# Patient Record
Sex: Female | Born: 1955
Health system: Southern US, Community
[De-identification: ages and names within clinical notes are randomized; demographics above are authoritative.]

## PROBLEM LIST (undated history)

## (undated) DIAGNOSIS — I509 Heart failure, unspecified: Secondary | ICD-10-CM

## (undated) DIAGNOSIS — N189 Chronic kidney disease, unspecified: Secondary | ICD-10-CM

## (undated) DIAGNOSIS — I10 Essential (primary) hypertension: Secondary | ICD-10-CM

## (undated) DIAGNOSIS — I428 Other cardiomyopathies: Secondary | ICD-10-CM

## (undated) DIAGNOSIS — D649 Anemia, unspecified: Secondary | ICD-10-CM

## (undated) DIAGNOSIS — M9080 Osteopathy in diseases classified elsewhere, unspecified site: Secondary | ICD-10-CM

## (undated) DIAGNOSIS — Z9289 Personal history of other medical treatment: Secondary | ICD-10-CM

## (undated) DIAGNOSIS — L299 Pruritus, unspecified: Secondary | ICD-10-CM

## (undated) DIAGNOSIS — D631 Anemia in chronic kidney disease: Secondary | ICD-10-CM

## (undated) DIAGNOSIS — E669 Obesity, unspecified: Secondary | ICD-10-CM

## (undated) DIAGNOSIS — M898X9 Other specified disorders of bone, unspecified site: Secondary | ICD-10-CM

## (undated) DIAGNOSIS — E785 Hyperlipidemia, unspecified: Secondary | ICD-10-CM

## (undated) DIAGNOSIS — E889 Metabolic disorder, unspecified: Secondary | ICD-10-CM

## (undated) DIAGNOSIS — E079 Disorder of thyroid, unspecified: Secondary | ICD-10-CM

## (undated) DIAGNOSIS — R609 Edema, unspecified: Secondary | ICD-10-CM

## (undated) DIAGNOSIS — J45909 Unspecified asthma, uncomplicated: Secondary | ICD-10-CM

## (undated) DIAGNOSIS — C541 Malignant neoplasm of endometrium: Secondary | ICD-10-CM

## (undated) DIAGNOSIS — E039 Hypothyroidism, unspecified: Secondary | ICD-10-CM

## (undated) DIAGNOSIS — K259 Gastric ulcer, unspecified as acute or chronic, without hemorrhage or perforation: Secondary | ICD-10-CM

## (undated) DIAGNOSIS — R110 Nausea: Secondary | ICD-10-CM

## (undated) DIAGNOSIS — Z8709 Personal history of other diseases of the respiratory system: Secondary | ICD-10-CM

## (undated) DIAGNOSIS — R600 Localized edema: Secondary | ICD-10-CM

## (undated) HISTORY — DX: Other specified disorders of bone, unspecified site: M89.8X9

## (undated) HISTORY — DX: Disorder of thyroid, unspecified: E07.9

## (undated) HISTORY — DX: Heart failure, unspecified: I50.9

## (undated) HISTORY — DX: Anemia, unspecified: D64.9

## (undated) HISTORY — DX: Metabolic disorder, unspecified: E88.9

## (undated) HISTORY — DX: Anemia in chronic kidney disease: D63.1

## (undated) HISTORY — DX: Chronic kidney disease, unspecified: N18.9

## (undated) HISTORY — PX: OTHER SURGICAL HISTORY: SHX169

## (undated) HISTORY — PX: COLONOSCOPY: SHX174

## (undated) HISTORY — DX: Hypothyroidism, unspecified: E03.9

## (undated) HISTORY — DX: Essential (primary) hypertension: I10

## (undated) HISTORY — DX: Gastric ulcer, unspecified as acute or chronic, without hemorrhage or perforation: K25.9

## (undated) HISTORY — DX: Malignant neoplasm of endometrium: C54.1

## (undated) HISTORY — DX: Obesity, unspecified: E66.9

## (undated) HISTORY — DX: Osteopathy in diseases classified elsewhere, unspecified site: M90.80

## (undated) HISTORY — PX: DILATION AND CURETTAGE, DIAGNOSTIC / THERAPEUTIC: SUR384

## (undated) HISTORY — PX: TUBAL LIGATION: SHX77

---

## 2008-09-26 ENCOUNTER — Emergency Department (HOSPITAL_COMMUNITY): Admission: EM | Admit: 2008-09-26 | Discharge: 2008-09-26 | Payer: Self-pay | Admitting: Emergency Medicine

## 2012-05-16 ENCOUNTER — Encounter: Payer: Self-pay | Admitting: Internal Medicine

## 2012-05-16 DIAGNOSIS — I509 Heart failure, unspecified: Secondary | ICD-10-CM

## 2012-05-16 DIAGNOSIS — R809 Proteinuria, unspecified: Secondary | ICD-10-CM

## 2012-05-16 DIAGNOSIS — D509 Iron deficiency anemia, unspecified: Secondary | ICD-10-CM

## 2012-05-16 DIAGNOSIS — N184 Chronic kidney disease, stage 4 (severe): Secondary | ICD-10-CM

## 2012-05-21 DIAGNOSIS — D649 Anemia, unspecified: Secondary | ICD-10-CM

## 2012-05-21 DIAGNOSIS — N189 Chronic kidney disease, unspecified: Secondary | ICD-10-CM

## 2012-06-04 DIAGNOSIS — D509 Iron deficiency anemia, unspecified: Secondary | ICD-10-CM

## 2012-06-11 DIAGNOSIS — N189 Chronic kidney disease, unspecified: Secondary | ICD-10-CM

## 2012-06-11 DIAGNOSIS — D649 Anemia, unspecified: Secondary | ICD-10-CM

## 2012-07-02 ENCOUNTER — Encounter: Payer: Self-pay | Admitting: Internal Medicine

## 2012-07-08 ENCOUNTER — Encounter: Payer: Self-pay | Admitting: Vascular Surgery

## 2012-07-08 ENCOUNTER — Other Ambulatory Visit: Payer: Self-pay | Admitting: *Deleted

## 2012-07-08 DIAGNOSIS — Z01810 Encounter for preprocedural cardiovascular examination: Secondary | ICD-10-CM

## 2012-07-08 DIAGNOSIS — N184 Chronic kidney disease, stage 4 (severe): Secondary | ICD-10-CM

## 2012-07-09 ENCOUNTER — Other Ambulatory Visit: Payer: Self-pay

## 2012-07-09 ENCOUNTER — Encounter: Payer: Self-pay | Admitting: Vascular Surgery

## 2012-07-09 ENCOUNTER — Encounter (INDEPENDENT_AMBULATORY_CARE_PROVIDER_SITE_OTHER): Payer: BC Managed Care – PPO | Admitting: *Deleted

## 2012-07-09 ENCOUNTER — Ambulatory Visit (INDEPENDENT_AMBULATORY_CARE_PROVIDER_SITE_OTHER): Payer: BC Managed Care – PPO | Admitting: Vascular Surgery

## 2012-07-09 VITALS — BP 166/79 | HR 74 | Ht 63.0 in | Wt 175.0 lb

## 2012-07-09 DIAGNOSIS — N184 Chronic kidney disease, stage 4 (severe): Secondary | ICD-10-CM

## 2012-07-09 DIAGNOSIS — Z01810 Encounter for preprocedural cardiovascular examination: Secondary | ICD-10-CM

## 2012-07-09 DIAGNOSIS — N186 End stage renal disease: Secondary | ICD-10-CM

## 2012-07-09 NOTE — Assessment & Plan Note (Signed)
This patient will likely require an AV graft. The forearm and upper arm cephalic vein bilaterally are very small. There is a small chance that Shannon [name] would be a candidate for a left basilic vein transposition. I have explained that I will interrogate her veins with the duplex at the time of surgery. If at all possible we will place an AV fistula. I have explained the indications for placement of an AV fistula or AV graft. I've explained that if at all possible we will place an AV fistula.  I have reviewed the risks of placement of an AV fistula including but not limited to: failure of the fistula to mature, need for subsequent interventions, and thrombosis. In addition I have reviewed the potential complications of placement of an AV graft. These risks include, but are not limited to, graft thrombosis, graft infection, wound healing problems, bleeding, arm swelling, and steal syndrome. All the patient's questions were answered and they are agreeable to proceed with surgery. Her surgery is scheduled for 07/22/2012.

## 2012-07-09 NOTE — Progress Notes (Signed)
[facility] [place]  Patient name: Shannon [name] MRN: [id number] DOB: May 14, 1956 Sex: female  REASON FOR CONSULT: evaluate for hemodialysis access. Referred by Dr. Lowanda Foster  HPI: Shannon [name] is a 57 y.o. female who is not yet on dialysis. She is right-handed. She has stage IV chronic kidney disease. We were asked to evaluate her for hemodialysis access. She denies any recent uremic symptoms. Specifically she denies nausea, vomiting, fatigue, anorexia, or palpitations.  Past Medical History  Diagnosis Date  . Hypertension   . Thyroid disease     hypothyroidism  . Obesity   . Anemia   . Metabolic bone disease   . Chronic kidney disease     Membranous nephropathy ( Seen at [facility])  . Gastric ulcer   . CHF (congestive heart failure)     last echo showed EF 20%  . Atrial fibrillation   . Hypothyroidism     Family History  Problem Relation Age of Onset  . Kidney disease Other   . Diabetes Mother     SOCIAL HISTORY: History  Substance Use Topics  . Smoking status: Current Every Day Smoker -- 1.00 packs/day for 30 years    Types: Cigarettes    Last Attempt to Quit: 12/30/2011  . Smokeless tobacco: Never Used  . Alcohol Use: No    Allergies  Allergen Reactions  . Corticosteroids Hives, Itching and Nausea And Vomiting  . Rituximab     wheezing    Current Outpatient Prescriptions  Medication Sig Dispense Refill  . levothyroxine (SYNTHROID, LEVOTHROID) 25 MCG tablet Take 25 mcg by mouth daily.      Marland Kitchen lisinopril (PRINIVIL,ZESTRIL) 40 MG tablet Take 40 mg by mouth daily.      Marland Kitchen losartan (COZAAR) 50 MG tablet Take 50 mg by mouth daily.      . metolazone (ZAROXOLYN) 5 MG tablet Take 5 mg by mouth daily.      . metoprolol succinate (TOPROL-XL) 50 MG 24 hr tablet Take 50 mg by mouth daily. Take with or immediately following a meal.      . potassium chloride (K-DUR,KLOR-CON) 10 MEQ tablet Take 10 mEq by mouth 2 (two) times daily.      Marland Kitchen POTASSIUM  PO Take 50 mg by mouth daily.      . simvastatin (ZOCOR) 40 MG tablet Take 40 mg by mouth every evening.      . tacrolimus (PROGRAF) 5 MG capsule Take 5 mg by mouth 3 (three) times daily.       Marland Kitchen torsemide (DEMADEX) 20 MG tablet Take 20 mg by mouth daily.      Marland Kitchen triamcinolone cream (KENALOG) 0.1 % Apply topically 2 (two) times daily.      . Vitamin D, Ergocalciferol, (DRISDOL) 50000 UNITS CAPS Take 50,000 Units by mouth every 7 (seven) days.       No current facility-administered medications for this visit.    REVIEW OF SYSTEMS: Valu.Nieves ] denotes positive finding; [  ] denotes negative finding  CARDIOVASCULAR:  [ ]  chest pain   [ ]  chest pressure   [ ]  palpitations   [ ]  orthopnea   [ ]  dyspnea on exertion   [ ]  claudication   [ ]  rest pain   [ ]  DVT   [ ]  phlebitis PULMONARY:   [ ]  productive cough   Valu.Nieves ] asthma   Valu.Nieves ] wheezing NEUROLOGIC:   [ ]  weakness  [ ]  paresthesias  [ ]  aphasia  [ ]   amaurosis  [ ]  dizziness HEMATOLOGIC:   [ ]  bleeding problems   [ ]  clotting disorders MUSCULOSKELETAL:  [ ]  joint pain   [ ]  joint swelling Valu.Nieves ] leg swelling GASTROINTESTINAL: [ ]   blood in stool  [ ]   hematemesis GENITOURINARY:  [ ]   dysuria  [ ]   hematuria PSYCHIATRIC:  [ ]  history of major depression INTEGUMENTARY:  Valu.Nieves ] rashes  [ ]  ulcers CONSTITUTIONAL:  [ ]  fever   [ ]  chills  PHYSICAL EXAM: Filed Vitals:   07/09/12 1029  BP: 166/79  Pulse: 74  Height: 5\' 3"  (1.6 m)  Weight: 175 lb (79.379 kg)  SpO2: 100%   Body mass index is 31.01 kg/(m^2). GENERAL: The patient is a well-nourished female, in no acute distress. The vital signs are documented above. CARDIOVASCULAR: There is a regular rate and rhythm. She has palpable radial pulses bilaterally. PULMONARY: There is good air exchange bilaterally without wheezing or rales. ABDOMEN: Soft and non-tender with normal pitched bowel sounds.  MUSCULOSKELETAL: There are no major deformities or cyanosis. NEUROLOGIC: No focal weakness or paresthesias are  detected. SKIN: There are no ulcers or rashes noted. PSYCHIATRIC: The patient has a normal affect.  DATA:  I have reviewed her records from Dr. Florentina Addison office. She has stage IV chronic kidney disease. Her renal function has been slowly declining. She has idiopathic membranous nephropathy and has tried multiple medications without success. In addition she has a history of hypothyroidism and is on Synthroid. She also has had anemia in the past. She has metabolic bone disease.  I have independently interpreted her duplex of her veins in both upper extremities. The forearm and upper arm cephalic veins in both arms are very small and did not appear to be usable for a fistula. Likewise the basilic veins are somewhat small but there is a small chance she would be a candidate for basilic vein transposition based on her vein mapping.  MEDICAL ISSUES:  Chronic kidney disease, stage IV (severe) This patient will likely require an AV graft. The forearm and upper arm cephalic vein bilaterally are very small. There is a small chance that she would be a candidate for a left basilic vein transposition. I have explained that I will interrogate her veins with the duplex at the time of surgery. If at all possible we will place an AV fistula. I have explained the indications for placement of an AV fistula or AV graft. I've explained that if at all possible we will place an AV fistula.  I have reviewed the risks of placement of an AV fistula including but not limited to: failure of the fistula to mature, need for subsequent interventions, and thrombosis. In addition I have reviewed the potential complications of placement of an AV graft. These risks include, but are not limited to, graft thrombosis, graft infection, wound healing problems, bleeding, arm swelling, and steal syndrome. All the patient's questions were answered and they are agreeable to proceed with surgery. Her surgery is scheduled for  07/22/2012.    [facility]: [phone]

## 2012-07-10 ENCOUNTER — Encounter (HOSPITAL_COMMUNITY): Payer: Self-pay | Admitting: Pharmacy Technician

## 2012-07-18 ENCOUNTER — Encounter (HOSPITAL_COMMUNITY): Payer: Self-pay | Admitting: *Deleted

## 2012-07-18 ENCOUNTER — Inpatient Hospital Stay (HOSPITAL_COMMUNITY)
Admission: RE | Admit: 2012-07-18 | Discharge: 2012-07-18 | Disposition: A | Discharge status: Home / Self Care | Payer: BC Managed Care – PPO | Source: Ambulatory Visit

## 2012-07-18 NOTE — Pre-Procedure Instructions (Signed)
KISSIE DANT  07/18/2012   Your procedure is scheduled on:  Tues, Mar 11 @ 7:30 AM  Report to [place] at 5:30 AM.  Call this number if you have problems the morning of surgery: [phone]   Remember:   Do not eat food or drink liquids after midnight.   Take these medicines the morning of surgery with A SIP OF WATER: Albuterol<Bring Your Inhaler With You>,Carvedilol(Coreg),Hydralazine(Apresoline),Synthroid(Levothyroxine),Zofran(Ondansetron-if needed),and Imdur(Isosorbide)   Do not wear jewelry, make-up or nail polish.  Do not wear lotions, powders, or perfumes. You may wear deodorant.  Do not shave 48 hours prior to surgery.   Do not bring valuables to the hospital.  Contacts, dentures or bridgework may not be worn into surgery.  Leave suitcase in the car. After surgery it may be brought to your room.  For patients admitted to the hospital, checkout time is 11:00 AM the day of  discharge.   Patients discharged the day of surgery will not be allowed to drive  home.    Special Instructions: Shower using CHG 2 nights before surgery and the night before surgery.  If you shower the day of surgery use CHG.  Use special wash - you have one bottle of CHG for all showers.  You should use approximately 1/3 of the bottle for each shower.   Please read over the following fact sheets that you were given: Pain Booklet, Coughing and Deep Breathing, MRSA Information and Surgical Site Infection Prevention

## 2012-07-18 NOTE — Progress Notes (Signed)
Pt doesn't have a cardiologist  Denies ever having an echo or stress test   Heart cath done at [facility]- to request report- was reported as no blockages  Medical Md with [facility]-Dr.Vyas  EKG to be requested from [place] to be requested from [facility]

## 2012-07-21 MED ORDER — DEXTROSE 5 % IV SOLN
1.5000 g | INTRAVENOUS | Status: AC
  Administered 2012-07-22: 1.5 g via INTRAVENOUS
  Filled 2012-07-21: qty 1.5

## 2012-07-22 ENCOUNTER — Ambulatory Visit (HOSPITAL_COMMUNITY): Payer: BC Managed Care – PPO

## 2012-07-22 ENCOUNTER — Telehealth: Payer: Self-pay | Admitting: Vascular Surgery

## 2012-07-22 ENCOUNTER — Encounter (HOSPITAL_COMMUNITY): Payer: Self-pay | Admitting: Surgery

## 2012-07-22 ENCOUNTER — Encounter (HOSPITAL_COMMUNITY)
Admission: RE | Disposition: A | Discharge status: Home / Self Care | Payer: Self-pay | Source: Ambulatory Visit | Attending: Vascular Surgery

## 2012-07-22 ENCOUNTER — Ambulatory Visit (HOSPITAL_COMMUNITY)
Admission: RE | Admit: 2012-07-22 | Discharge: 2012-07-22 | Disposition: A | Discharge status: Home / Self Care | Payer: BC Managed Care – PPO | Source: Ambulatory Visit | Attending: Vascular Surgery | Admitting: Vascular Surgery

## 2012-07-22 ENCOUNTER — Ambulatory Visit (HOSPITAL_COMMUNITY): Payer: BC Managed Care – PPO | Admitting: Anesthesiology

## 2012-07-22 ENCOUNTER — Encounter (HOSPITAL_COMMUNITY): Payer: Self-pay | Admitting: Anesthesiology

## 2012-07-22 DIAGNOSIS — Z6830 Body mass index (BMI) 30.0-30.9, adult: Secondary | ICD-10-CM | POA: Insufficient documentation

## 2012-07-22 DIAGNOSIS — F17200 Nicotine dependence, unspecified, uncomplicated: Secondary | ICD-10-CM | POA: Insufficient documentation

## 2012-07-22 DIAGNOSIS — I509 Heart failure, unspecified: Secondary | ICD-10-CM | POA: Insufficient documentation

## 2012-07-22 DIAGNOSIS — I4891 Unspecified atrial fibrillation: Secondary | ICD-10-CM | POA: Insufficient documentation

## 2012-07-22 DIAGNOSIS — E039 Hypothyroidism, unspecified: Secondary | ICD-10-CM | POA: Insufficient documentation

## 2012-07-22 DIAGNOSIS — I129 Hypertensive chronic kidney disease with stage 1 through stage 4 chronic kidney disease, or unspecified chronic kidney disease: Secondary | ICD-10-CM | POA: Insufficient documentation

## 2012-07-22 DIAGNOSIS — N184 Chronic kidney disease, stage 4 (severe): Secondary | ICD-10-CM | POA: Insufficient documentation

## 2012-07-22 DIAGNOSIS — E669 Obesity, unspecified: Secondary | ICD-10-CM | POA: Insufficient documentation

## 2012-07-22 DIAGNOSIS — N186 End stage renal disease: Secondary | ICD-10-CM

## 2012-07-22 HISTORY — DX: Nausea: R11.0

## 2012-07-22 HISTORY — DX: Personal history of other diseases of the respiratory system: Z87.09

## 2012-07-22 HISTORY — DX: Hyperlipidemia, unspecified: E78.5

## 2012-07-22 HISTORY — DX: Edema, unspecified: R60.9

## 2012-07-22 HISTORY — DX: Unspecified asthma, uncomplicated: J45.909

## 2012-07-22 HISTORY — DX: Pruritus, unspecified: L29.9

## 2012-07-22 HISTORY — PX: AV FISTULA PLACEMENT: SHX1204

## 2012-07-22 HISTORY — DX: Personal history of other medical treatment: Z92.89

## 2012-07-22 HISTORY — DX: Localized edema: R60.0

## 2012-07-22 LAB — POCT I-STAT 4, (NA,K, GLUC, HGB,HCT): Sodium: 142 mEq/L (ref 135–145)

## 2012-07-22 LAB — SURGICAL PCR SCREEN
MRSA, PCR: NEGATIVE
Staphylococcus aureus: NEGATIVE

## 2012-07-22 SURGERY — INSERTION OF ARTERIOVENOUS (AV) GORE-TEX GRAFT ARM
Anesthesia: Monitor Anesthesia Care | Site: Arm Upper | Laterality: Left | Wound class: Clean

## 2012-07-22 MED ORDER — OXYCODONE HCL 5 MG/5ML PO SOLN: 5.0000 mg | Freq: Once | ORAL | Status: DC | PRN

## 2012-07-22 MED ORDER — LIDOCAINE HCL (PF) 1 % IJ SOLN
INTRAMUSCULAR | Status: DC | PRN
  Administered 2012-07-22: 30 mL

## 2012-07-22 MED ORDER — 0.9 % SODIUM CHLORIDE (POUR BTL) OPTIME
TOPICAL | Status: DC | PRN
  Administered 2012-07-22: 1000 mL

## 2012-07-22 MED ORDER — ONDANSETRON HCL 4 MG/2ML IJ SOLN
INTRAMUSCULAR | Status: DC | PRN
  Administered 2012-07-22: 4 mg via INTRAVENOUS

## 2012-07-22 MED ORDER — PROTAMINE SULFATE 10 MG/ML IV SOLN
INTRAVENOUS | Status: DC | PRN
  Administered 2012-07-22: 20 mg via INTRAVENOUS
  Administered 2012-07-22: 10 mg via INTRAVENOUS

## 2012-07-22 MED ORDER — OXYCODONE HCL 5 MG PO TABS
ORAL_TABLET | ORAL | Status: AC
  Administered 2012-07-22: 5 mg
  Filled 2012-07-22: qty 1

## 2012-07-22 MED ORDER — NITROGLYCERIN 0.4 MG SL SUBL
SUBLINGUAL_TABLET | SUBLINGUAL | Status: AC
  Filled 2012-07-22: qty 25

## 2012-07-22 MED ORDER — HYDROMORPHONE HCL PF 1 MG/ML IJ SOLN
0.2500 mg | INTRAMUSCULAR | Status: DC | PRN
  Administered 2012-07-22: 0.5 mg via INTRAVENOUS

## 2012-07-22 MED ORDER — HEPARIN SODIUM (PORCINE) 1000 UNIT/ML IJ SOLN
INTRAMUSCULAR | Status: DC | PRN
  Administered 2012-07-22: 6000 [IU] via INTRAVENOUS

## 2012-07-22 MED ORDER — HYDROMORPHONE HCL PF 1 MG/ML IJ SOLN
INTRAMUSCULAR | Status: AC
  Filled 2012-07-22: qty 1

## 2012-07-22 MED ORDER — CARVEDILOL 12.5 MG PO TABS
12.5000 mg | ORAL_TABLET | Freq: Two times a day (BID) | ORAL | Status: DC
  Filled 2012-07-22 (×3): qty 1

## 2012-07-22 MED ORDER — MUPIROCIN 2 % EX OINT: TOPICAL_OINTMENT | Freq: Two times a day (BID) | CUTANEOUS | Status: DC

## 2012-07-22 MED ORDER — LIDOCAINE HCL (CARDIAC) 20 MG/ML IV SOLN
INTRAVENOUS | Status: DC | PRN
  Administered 2012-07-22: 50 mg via INTRAVENOUS

## 2012-07-22 MED ORDER — MIDAZOLAM HCL 5 MG/5ML IJ SOLN
INTRAMUSCULAR | Status: DC | PRN
  Administered 2012-07-22 (×4): 0.5 mg via INTRAVENOUS

## 2012-07-22 MED ORDER — THROMBIN 20000 UNITS EX SOLR
CUTANEOUS | Status: AC
  Filled 2012-07-22: qty 20000

## 2012-07-22 MED ORDER — SODIUM CHLORIDE 0.9 % IV SOLN: INTRAVENOUS | Status: DC | PRN

## 2012-07-22 MED ORDER — LIDOCAINE-EPINEPHRINE (PF) 1 %-1:200000 IJ SOLN
INTRAMUSCULAR | Status: AC
  Filled 2012-07-22: qty 10

## 2012-07-22 MED ORDER — DEXTROSE 5 % IV SOLN
INTRAVENOUS | Status: DC | PRN
  Administered 2012-07-22: 08:00:00 via INTRAVENOUS

## 2012-07-22 MED ORDER — ONDANSETRON HCL 4 MG/2ML IJ SOLN: 4.0000 mg | Freq: Once | INTRAMUSCULAR | Status: DC | PRN

## 2012-07-22 MED ORDER — MUPIROCIN 2 % EX OINT
TOPICAL_OINTMENT | CUTANEOUS | Status: AC
  Administered 2012-07-22: 1 via NASAL
  Filled 2012-07-22: qty 22

## 2012-07-22 MED ORDER — OXYCODONE HCL 5 MG PO TABS: 5.0000 mg | ORAL_TABLET | Freq: Once | ORAL | Status: DC | PRN

## 2012-07-22 MED ORDER — SODIUM CHLORIDE 0.9 % IR SOLN
Status: DC | PRN
  Administered 2012-07-22: 08:00:00

## 2012-07-22 MED ORDER — LIDOCAINE HCL (PF) 1 % IJ SOLN
INTRAMUSCULAR | Status: AC
  Filled 2012-07-22: qty 30

## 2012-07-22 MED ORDER — DEXTROSE 5 % IV SOLN
INTRAVENOUS | Status: AC
  Filled 2012-07-22: qty 50

## 2012-07-22 MED ORDER — PROPOFOL INFUSION 10 MG/ML OPTIME
INTRAVENOUS | Status: DC | PRN
  Administered 2012-07-22: 25 ug/kg/min via INTRAVENOUS

## 2012-07-22 MED ORDER — FENTANYL CITRATE 0.05 MG/ML IJ SOLN
INTRAMUSCULAR | Status: DC | PRN
  Administered 2012-07-22 (×6): 25 ug via INTRAVENOUS

## 2012-07-22 MED ORDER — MEPERIDINE HCL 25 MG/ML IJ SOLN: 6.2500 mg | INTRAMUSCULAR | Status: DC | PRN

## 2012-07-22 MED ORDER — SODIUM CHLORIDE 0.9 % IV SOLN: INTRAVENOUS | Status: DC

## 2012-07-22 MED ORDER — CEFUROXIME SODIUM 1.5 G IJ SOLR
INTRAMUSCULAR | Status: AC
  Filled 2012-07-22: qty 1.5

## 2012-07-22 MED ORDER — SODIUM CHLORIDE 0.9 % IV SOLN
INTRAVENOUS | Status: DC | PRN
  Administered 2012-07-22: 07:00:00 via INTRAVENOUS

## 2012-07-22 SURGICAL SUPPLY — 40 items
CANISTER SUCTION 2500CC (MISCELLANEOUS) ×2 IMPLANT
CLIP TI MEDIUM 24 (CLIP) ×2 IMPLANT
CLIP TI WIDE RED SMALL 24 (CLIP) ×2 IMPLANT
CLOTH BEACON ORANGE TIMEOUT ST (SAFETY) ×2 IMPLANT
COVER PROBE W GEL 5X96 (DRAPES) IMPLANT
COVER SURGICAL LIGHT HANDLE (MISCELLANEOUS) ×2 IMPLANT
DECANTER SPIKE VIAL GLASS SM (MISCELLANEOUS) IMPLANT
DERMABOND ADVANCED (GAUZE/BANDAGES/DRESSINGS) ×1
DERMABOND ADVANCED .7 DNX12 (GAUZE/BANDAGES/DRESSINGS) ×1 IMPLANT
DRAIN PENROSE 1/2X12 LTX STRL (WOUND CARE) IMPLANT
ELECT REM PT RETURN 9FT ADLT (ELECTROSURGICAL) ×2
ELECTRODE REM PT RTRN 9FT ADLT (ELECTROSURGICAL) ×1 IMPLANT
GEL ULTRASOUND 20GR AQUASONIC (MISCELLANEOUS) ×2 IMPLANT
GLOVE BIO SURGEON STRL SZ 6.5 (GLOVE) ×6 IMPLANT
GLOVE BIO SURGEON STRL SZ7.5 (GLOVE) ×4 IMPLANT
GLOVE BIOGEL PI IND STRL 6.5 (GLOVE) ×1 IMPLANT
GLOVE BIOGEL PI IND STRL 7.0 (GLOVE) ×1 IMPLANT
GLOVE BIOGEL PI IND STRL 8 (GLOVE) ×2 IMPLANT
GLOVE BIOGEL PI INDICATOR 6.5 (GLOVE) ×1
GLOVE BIOGEL PI INDICATOR 7.0 (GLOVE) ×1
GLOVE BIOGEL PI INDICATOR 8 (GLOVE) ×2
GOWN STRL NON-REIN LRG LVL3 (GOWN DISPOSABLE) ×8 IMPLANT
GRAFT GORETEX STRT 4-7X45 (Vascular Products) ×2 IMPLANT
KIT BASIN OR (CUSTOM PROCEDURE TRAY) ×2 IMPLANT
KIT ROOM TURNOVER OR (KITS) ×2 IMPLANT
NS IRRIG 1000ML POUR BTL (IV SOLUTION) ×2 IMPLANT
PACK CV ACCESS (CUSTOM PROCEDURE TRAY) ×2 IMPLANT
PAD ARMBOARD 7.5X6 YLW CONV (MISCELLANEOUS) ×4 IMPLANT
SPONGE GAUZE 4X4 12PLY (GAUZE/BANDAGES/DRESSINGS) ×2 IMPLANT
SPONGE SURGIFOAM ABS GEL 100 (HEMOSTASIS) IMPLANT
STOCKINETTE 6  STRL (DRAPES) ×1
STOCKINETTE 6 STRL (DRAPES) ×1 IMPLANT
SUT PROLENE 6 0 BV (SUTURE) ×6 IMPLANT
SUT VIC AB 3-0 SH 27 (SUTURE) ×2
SUT VIC AB 3-0 SH 27X BRD (SUTURE) ×2 IMPLANT
SUT VICRYL 4-0 PS2 18IN ABS (SUTURE) ×6 IMPLANT
TOWEL OR 17X24 6PK STRL BLUE (TOWEL DISPOSABLE) ×2 IMPLANT
TOWEL OR 17X26 10 PK STRL BLUE (TOWEL DISPOSABLE) ×2 IMPLANT
UNDERPAD 30X30 INCONTINENT (UNDERPADS AND DIAPERS) ×2 IMPLANT
WATER STERILE IRR 1000ML POUR (IV SOLUTION) ×2 IMPLANT

## 2012-07-22 NOTE — Progress Notes (Signed)
Pharmacy to tube Coreg 12.5 mg to holding. Lelon Perla to look for medication in tube.

## 2012-07-22 NOTE — Anesthesia Preprocedure Evaluation (Addendum)
Anesthesia Evaluation  Patient identified by MRN, date of birth, ID band Patient awake    Reviewed: Allergy & Precautions, H&P , NPO status , Patient's Chart, lab work & pertinent test results, reviewed documented beta blocker date and time   Airway Mallampati: II TM Distance: >3 FB Neck ROM: Full    Dental  (+) Edentulous Upper and Dental Advisory Given   Pulmonary asthma ,          Cardiovascular hypertension, Pt. on medications and Pt. on home beta blockers +CHF     Neuro/Psych    GI/Hepatic   Endo/Other    Renal/GU CRFRenal disease     Musculoskeletal   Abdominal   Peds  Hematology   Anesthesia Other Findings   Reproductive/Obstetrics                        Anesthesia Physical Anesthesia Plan  ASA: III  Anesthesia Plan: General   Post-op Pain Management:    Induction: Intravenous  Airway Management Planned: LMA  Additional Equipment:   Intra-op Plan:   Post-operative Plan: Extubation in OR  Informed Consent: I have reviewed the patients History and Physical, chart, labs and discussed the procedure including the risks, benefits and alternatives for the proposed anesthesia with the patient or authorized representative who has indicated his/her understanding and acceptance.     Plan Discussed with: CRNA and Surgeon  Anesthesia Plan Comments:         Anesthesia Quick Evaluation

## 2012-07-22 NOTE — Op Note (Signed)
NAME: Shannon [name]   MRN: [id number] DOB: [birth year]    DATE OF OPERATION: 07/22/2012  PREOP DIAGNOSIS: Chronic kidney disease  POSTOP DIAGNOSIS: Same  PROCEDURE: New left upper arm AV graft (4-7 mm PTFE graft)  SURGEON: Judeth Cornfield. Scot Dock, MD, FACS  ASSIST: Leontine Locket, PA  ANESTHESIA: local with sedation   EBL: minimal  INDICATIONS: Shannon [name] is a 57 y.o. female who is not yet on dialysis. She presents for new access. Preoperative vein mapping showed that she did not have any adequate vein for a fistula. My own intraoperative duplex confirmed those findings. She had very small veins I elected to place an upper arm graft.  FINDINGS: the axillary vein took a 5 mm dilator.  TECHNIQUE: The patient was brought to the operating room and sedated by anesthesia. The left upper extremity was prepped and draped in the usual sterile fashion. After the skin was anesthetized with 1% lidocaine, a longitudinal incision was made just above the antecubital space. Through this incision the the brachial artery and adjacent brachial veins were dissected free. The brachial veins were very small. I therefore elected to place an upper arm graft. The brachial artery appeared to be adequate size. After the skin was anesthetized, a longitudinal incision was made beneath the axilla. The high brachial vein was dissected free. A 4-7 mm tapered PTFE graft was tunneled between the 2 incisions. The patient was heparinized. The brachial artery was clamped proximally and distally and a longitudinal arteriotomy was made. The segment of the 4 mm end of the graft was excised the graft slightly spatulated and sewn end to side to the artery using continuous 6-0 Prolene suture. The graft and poor the appropriate length for anastomosis to the high brachial vein. The vein was ligated distally and spatulated proximally. The graft was cut the appropriate length, spatulated, and sewn end-to-end to the vein using  continuous 6-0 Prolene suture. At the completion was excellent thrill in the graft and a palpable radial pulse. Hemostasis was obtained in the wounds. The heparin was partially reversed with protamine. The wounds were closed the deep layer 3-0 Vicryl and the skin closed with 4-0 Vicryl. Raybon was applied. The patient tolerated the procedure well and was transferred to the recovery room in stable condition. All needle and sponge counts were correct.  [name], MD, FACS [facility]  DATE OF DICTATION:   07/22/2012

## 2012-07-22 NOTE — Transfer of Care (Signed)
Immediate Anesthesia Transfer of Care Note  Patient: Shannon [name]  Procedure(s) Performed: Procedure(s): INSERTION OF ARTERIOVENOUS GORE-TEX GRAFT ARM (Left)  2Patient Location: PACU  Anesthesia Type:MAC  Level of Consciousness: awake, alert , oriented and patient cooperative  Airway & Oxygen Therapy: Patient Spontanous Breathing and Patient connected to nasal cannula oxygen  Post-op Assessment: Report given to PACU RN, Post -op Vital signs reviewed and stable and Patient moving all extremities  Post vital signs: Reviewed and stable  Complications: No apparent anesthesia complications

## 2012-07-22 NOTE — Telephone Encounter (Signed)
Message copied by Berniece Salines on Tue Jul 22, 2012  3:47 PM ------      Message from: Alfonso Patten      Created: Tue Jul 22, 2012  9:57 AM      Regarding: FW: charge and f/u                   ----- Message -----         From: Angelia Mould, MD         Sent: 07/22/2012   9:49 AM           To: Patrici Ranks, Alfonso Patten, RN, #      Subject: charge and f/u                                           PROCEDURE: New left upper arm AV graft (4-7 mm PTFE graft)            SURGEON: Judeth Cornfield. Scot Dock, MD, FACS            ASSIST: Leontine Locket, PA            She'll need a follow visit in approximately 2 weeks to check on her incisions. She could see the nurse practitioner or PA. Thanks CD       ------

## 2012-07-22 NOTE — Preoperative (Addendum)
Beta Blockers   Reason not to administer Beta Blockers:Coreg 0430 today according to patient.  To evaluate for further need intraop.

## 2012-07-22 NOTE — Addendum Note (Signed)
Addendum created 07/22/12 1342 by Terrill Mohr, CRNA   Modules edited: Anesthesia Medication Administration

## 2012-07-22 NOTE — Progress Notes (Signed)
VASCULAR PROGRESS NOTE  SUBJECTIVE: patient complained of chest pain earlier but this quickly resolved after she was placed on oxygen. She denies any previous cardiac history. In addition she had some paresthesias in her left hand which have improved.  PHYSICAL EXAM: Filed Vitals:   07/22/12 1034 07/22/12 1043 07/22/12 1045 07/22/12 1129  BP: 123/52   121/60  Pulse:  52 55 57  Temp:   97 F (36.1 C) 97 F (36.1 C)  TempSrc:    Oral  Resp:  11 18 14   Height:      Weight:      SpO2:  96% 97% 90%   She has a palpable left radial pulse. Her graft has a palpable thrill. Lungs are clear.  LABS: Lab Results  Component Value Date   HGB 11.2* 07/22/2012   HCT 33.0* 07/22/2012   ASSESSMENT AND PLAN: 1. Patient had a brief episode of chest pain which has resolved. Her EKG is unremarkable except for sinus bradycardia with a heart rate of 48. I do not see any ischemic changes or evidence of a heart block. If she has any further chest pain I have encouraged her to follow up with her primary care physician. 2. The patient initially had some paresthesias in the left and postop which have improved significantly. Given that she had a palpable radial pulse I felt this was likely related to her local anesthetic which was placed adjacent to the nerves.   Gae Gallop [id number] 07/22/2012

## 2012-07-22 NOTE — Progress Notes (Signed)
Upon arrival to short stay patient c/o chest tightness, O2 sat 90 %, stated not getting enough air. O2 stated at 2 l/min and o2 sat increased to 99 % [place]. Dr Conrad [place] notified ordered stat EKG. EKG result called to Dr Conrad [place] rate of 48 otherwise normal EKG. Dr Conrad [place] ordered for nurse to call Dr. Scot Dock . Dr Scot Dock  Ordered NTG sl , given at 1120. BP 117/65 O2 99% HR 51. 1130 Dr. Scot Dock here to see patient, reviewed chart and EKG. Stated patient to rest and D/C if stable and patient is ready for D/C home.

## 2012-07-22 NOTE — Progress Notes (Signed)
C/o numbness on left hand. Cool to touch, sensation present,radial pulse weak, able to move fingers. Seen by Dr. Scot Dock.

## 2012-07-22 NOTE — Progress Notes (Signed)
Care of pt assumed by MA Xanthe Couillard RN 

## 2012-07-22 NOTE — Anesthesia Postprocedure Evaluation (Signed)
Anesthesia Post Note  Patient: Shannon [name]  Procedure(s) Performed: Procedure(s) (LRB): INSERTION OF ARTERIOVENOUS GORE-TEX GRAFT ARM (Left)  Anesthesia type: general  Patient location: PACU  Post pain: Pain level controlled  Post assessment: Patient's Cardiovascular Status Stable  Last Vitals:  Filed Vitals:   07/22/12 1029  BP:   Pulse: 54  Temp:   Resp: 18    Post vital signs: Reviewed and stable  Level of consciousness: sedated  Complications: No apparent anesthesia complications

## 2012-07-22 NOTE — Interval H&P Note (Signed)
History and Physical Interval Note:  07/22/2012 7:14 AM  Shannon [name]  has presented today for surgery, with the diagnosis of End Stage Renal Disease  The various methods of treatment have been discussed with the patient and family. After consideration of risks, benefits and other options for treatment, the patient has consented to  Procedure(s) with comments: Gross (Left) - Left Basilic Vein Transposition vs Insertion Left Arm  Arteriovenous Graft as a surgical intervention .  The patient's history has been reviewed, patient examined, no change in status, stable for surgery.  I have reviewed the patient's chart and labs.  Questions were answered to the patient's satisfaction.     DICKSON,CHRISTOPHER S

## 2012-07-22 NOTE — H&P (View-Only) (Signed)
[facility] [place]  Patient name: Shannon [name] MRN: [id number] DOB: 1956/03/19 Sex: female  REASON FOR CONSULT: evaluate for hemodialysis access. Referred by Dr. Lowanda Foster  HPI: Shannon [name] is a 57 y.o. female who is not yet on dialysis. She is right-handed. She has stage IV chronic kidney disease. We were asked to evaluate her for hemodialysis access. She denies any recent uremic symptoms. Specifically she denies nausea, vomiting, fatigue, anorexia, or palpitations.  Past Medical History  Diagnosis Date  . Hypertension   . Thyroid disease     hypothyroidism  . Obesity   . Anemia   . Metabolic bone disease   . Chronic kidney disease     Membranous nephropathy ( Seen at [facility])  . Gastric ulcer   . CHF (congestive heart failure)     last echo showed EF 20%  . Atrial fibrillation   . Hypothyroidism     Family History  Problem Relation Age of Onset  . Kidney disease Other   . Diabetes Mother     SOCIAL HISTORY: History  Substance Use Topics  . Smoking status: Current Every Day Smoker -- 1.00 packs/day for 30 years    Types: Cigarettes    Last Attempt to Quit: 12/30/2011  . Smokeless tobacco: Never Used  . Alcohol Use: No    Allergies  Allergen Reactions  . Corticosteroids Hives, Itching and Nausea And Vomiting  . Rituximab     wheezing    Current Outpatient Prescriptions  Medication Sig Dispense Refill  . levothyroxine (SYNTHROID, LEVOTHROID) 25 MCG tablet Take 25 mcg by mouth daily.      Marland Kitchen lisinopril (PRINIVIL,ZESTRIL) 40 MG tablet Take 40 mg by mouth daily.      Marland Kitchen losartan (COZAAR) 50 MG tablet Take 50 mg by mouth daily.      . metolazone (ZAROXOLYN) 5 MG tablet Take 5 mg by mouth daily.      . metoprolol succinate (TOPROL-XL) 50 MG 24 hr tablet Take 50 mg by mouth daily. Take with or immediately following a meal.      . potassium chloride (K-DUR,KLOR-CON) 10 MEQ tablet Take 10 mEq by mouth 2 (two) times daily.      Marland Kitchen POTASSIUM  PO Take 50 mg by mouth daily.      . simvastatin (ZOCOR) 40 MG tablet Take 40 mg by mouth every evening.      . tacrolimus (PROGRAF) 5 MG capsule Take 5 mg by mouth 3 (three) times daily.       Marland Kitchen torsemide (DEMADEX) 20 MG tablet Take 20 mg by mouth daily.      Marland Kitchen triamcinolone cream (KENALOG) 0.1 % Apply topically 2 (two) times daily.      . Vitamin D, Ergocalciferol, (DRISDOL) 50000 UNITS CAPS Take 50,000 Units by mouth every 7 (seven) days.       No current facility-administered medications for this visit.    REVIEW OF SYSTEMS: Valu.Nieves ] denotes positive finding; [  ] denotes negative finding  CARDIOVASCULAR:  [ ]  chest pain   [ ]  chest pressure   [ ]  palpitations   [ ]  orthopnea   [ ]  dyspnea on exertion   [ ]  claudication   [ ]  rest pain   [ ]  DVT   [ ]  phlebitis PULMONARY:   [ ]  productive cough   Valu.Nieves ] asthma   Valu.Nieves ] wheezing NEUROLOGIC:   [ ]  weakness  [ ]  paresthesias  [ ]  aphasia  [ ]   amaurosis  [ ]  dizziness HEMATOLOGIC:   [ ]  bleeding problems   [ ]  clotting disorders MUSCULOSKELETAL:  [ ]  joint pain   [ ]  joint swelling Valu.Nieves ] leg swelling GASTROINTESTINAL: [ ]   blood in stool  [ ]   hematemesis GENITOURINARY:  [ ]   dysuria  [ ]   hematuria PSYCHIATRIC:  [ ]  history of major depression INTEGUMENTARY:  Valu.Nieves ] rashes  [ ]  ulcers CONSTITUTIONAL:  [ ]  fever   [ ]  chills  PHYSICAL EXAM: Filed Vitals:   07/09/12 1029  BP: 166/79  Pulse: 74  Height: 5\' 3"  (1.6 m)  Weight: 175 lb (79.379 kg)  SpO2: 100%   Body mass index is 31.01 kg/(m^2). GENERAL: The patient is a well-nourished female, in no acute distress. The vital signs are documented above. CARDIOVASCULAR: There is a regular rate and rhythm. She has palpable radial pulses bilaterally. PULMONARY: There is good air exchange bilaterally without wheezing or rales. ABDOMEN: Soft and non-tender with normal pitched bowel sounds.  MUSCULOSKELETAL: There are no major deformities or cyanosis. NEUROLOGIC: No focal weakness or paresthesias are  detected. SKIN: There are no ulcers or rashes noted. PSYCHIATRIC: The patient has a normal affect.  DATA:  I have reviewed her records from Dr. Florentina Addison office. She has stage IV chronic kidney disease. Her renal function has been slowly declining. She has idiopathic membranous nephropathy and has tried multiple medications without success. In addition she has a history of hypothyroidism and is on Synthroid. She also has had anemia in the past. She has metabolic bone disease.  I have independently interpreted her duplex of her veins in both upper extremities. The forearm and upper arm cephalic veins in both arms are very small and did not appear to be usable for a fistula. Likewise the basilic veins are somewhat small but there is a small chance she would be a candidate for basilic vein transposition based on her vein mapping.  MEDICAL ISSUES:  Chronic kidney disease, stage IV (severe) This patient will likely require an AV graft. The forearm and upper arm cephalic vein bilaterally are very small. There is a small chance that she would be a candidate for a left basilic vein transposition. I have explained that I will interrogate her veins with the duplex at the time of surgery. If at all possible we will place an AV fistula. I have explained the indications for placement of an AV fistula or AV graft. I've explained that if at all possible we will place an AV fistula.  I have reviewed the risks of placement of an AV fistula including but not limited to: failure of the fistula to mature, need for subsequent interventions, and thrombosis. In addition I have reviewed the potential complications of placement of an AV graft. These risks include, but are not limited to, graft thrombosis, graft infection, wound healing problems, bleeding, arm swelling, and steal syndrome. All the patient's questions were answered and they are agreeable to proceed with surgery. Her surgery is scheduled for  07/22/2012.    [facility] [place] Beeper: [phone]

## 2012-07-22 NOTE — Addendum Note (Signed)
Addendum created 07/22/12 1110 by Terrill Mohr, CRNA   Modules edited: Anesthesia Flowsheet

## 2012-07-22 NOTE — Telephone Encounter (Signed)
Spoke with pt, gave appt info - kf

## 2012-07-23 ENCOUNTER — Encounter: Payer: BC Managed Care – PPO | Admitting: Internal Medicine

## 2012-07-26 ENCOUNTER — Telehealth: Payer: Self-pay | Admitting: Vascular Surgery

## 2012-07-26 ENCOUNTER — Encounter (HOSPITAL_COMMUNITY): Payer: Self-pay | Admitting: Vascular Surgery

## 2012-07-26 NOTE — Telephone Encounter (Signed)
The patient called noting some swelling at her incisions from her previous surgery. She states there is no tenderness and no drainage. He did not think that the swelling was severe. I offered to see her in the emergency department however she would prefer to be seen in the office electively next week if the swelling does not improve.

## 2012-07-29 ENCOUNTER — Telehealth: Payer: Self-pay

## 2012-07-29 DIAGNOSIS — D638 Anemia in other chronic diseases classified elsewhere: Secondary | ICD-10-CM

## 2012-07-29 DIAGNOSIS — N189 Chronic kidney disease, unspecified: Secondary | ICD-10-CM

## 2012-07-29 NOTE — Telephone Encounter (Signed)
Phone call from pt. To report swelling and redness if left arm.  Also c/o intermittent numbness of left hand.  States she has to shake it and make it wake up.  Denies any difficulty with picking-up objects with left hand.  Denies pain in left hand.  Regarding redness, daughter got on phone and stated that the skin is a little red right along where the graft is located.  Denies any incisional redness/drainage/ or open area.  Denies fever/chills.  Pt. Denies any numbness in her hand at this time.  Has appt. For 2 wk. F/u with Dr. Scot Dock 08/06/12.  Encouraged to elevate left arm, above level of heart at intervals, and to do gentle range of motion, with fingers of left hand.  Advised to call office if symptoms worsen prior to f/u appt.  Verb. Understanding.

## 2012-08-05 ENCOUNTER — Encounter: Payer: Self-pay | Admitting: Vascular Surgery

## 2012-08-06 ENCOUNTER — Ambulatory Visit (INDEPENDENT_AMBULATORY_CARE_PROVIDER_SITE_OTHER): Payer: BC Managed Care – PPO | Admitting: Vascular Surgery

## 2012-08-06 ENCOUNTER — Encounter: Payer: Self-pay | Admitting: Vascular Surgery

## 2012-08-06 VITALS — BP 152/73 | HR 80 | Ht 63.0 in | Wt 175.0 lb

## 2012-08-06 DIAGNOSIS — N186 End stage renal disease: Secondary | ICD-10-CM

## 2012-08-06 NOTE — Progress Notes (Signed)
Vascular and Vein Specialist of [facility]  Patient name: Shannon [name] MRN: [id number] DOB: January 24, 1956 Sex: female  REASON FOR VISIT: follow up after left upper arm AV graft.  HPI: Shannon [name] is a 57 y.o. female who is not yet on dialysis. Vein mapping did not show any options for a fistula. She had an left upper arm AV graft placed on 07/22/2012. She comes in for a wound check. She denies pain or paresthesias are left upper extremity.   REVIEW OF SYSTEMS: Valu.Nieves ] denotes positive finding; [  ] denotes negative finding  CARDIOVASCULAR:  [ ]  chest pain   [ ]  dyspnea on exertion    CONSTITUTIONAL:  [ ]  fever   [ ]  chills  PHYSICAL EXAM: Filed Vitals:   08/06/12 0936  BP: 152/73  Pulse: 80  Height: 5\' 3"  (1.6 m)  Weight: 175 lb (79.379 kg)  SpO2: 97%   Body mass index is 31.01 kg/(m^2). GENERAL: The patient is a well-nourished female, in no acute distress. The vital signs are documented above. CARDIOVASCULAR: There is a regular rate and rhythm  PULMONARY: There is good air exchange bilaterally without wheezing or rales. The graft has an excellent thrill and bruit. She has a palpable left radial pulse. The hand is warm and well-perfused.  MEDICAL ISSUES: Her graft is working well and she would be able to use it for dialysis in mid April if that were necessary. I will see her back as needed.  [place] [facility] [place] Beeper: [phone]

## 2012-11-12 ENCOUNTER — Encounter: Payer: BC Managed Care – PPO | Admitting: Internal Medicine

## 2012-11-12 DIAGNOSIS — D509 Iron deficiency anemia, unspecified: Secondary | ICD-10-CM

## 2012-11-12 DIAGNOSIS — D631 Anemia in chronic kidney disease: Secondary | ICD-10-CM

## 2012-11-12 DIAGNOSIS — N184 Chronic kidney disease, stage 4 (severe): Secondary | ICD-10-CM

## 2012-11-12 DIAGNOSIS — R011 Cardiac murmur, unspecified: Secondary | ICD-10-CM

## 2012-11-12 DIAGNOSIS — N039 Chronic nephritic syndrome with unspecified morphologic changes: Secondary | ICD-10-CM

## 2012-11-19 DIAGNOSIS — D509 Iron deficiency anemia, unspecified: Secondary | ICD-10-CM

## 2012-11-27 DIAGNOSIS — D509 Iron deficiency anemia, unspecified: Secondary | ICD-10-CM

## 2012-12-03 DIAGNOSIS — D631 Anemia in chronic kidney disease: Secondary | ICD-10-CM

## 2012-12-03 DIAGNOSIS — N189 Chronic kidney disease, unspecified: Secondary | ICD-10-CM

## 2012-12-03 DIAGNOSIS — D509 Iron deficiency anemia, unspecified: Secondary | ICD-10-CM

## 2012-12-03 DIAGNOSIS — N039 Chronic nephritic syndrome with unspecified morphologic changes: Secondary | ICD-10-CM

## 2013-01-14 DIAGNOSIS — D509 Iron deficiency anemia, unspecified: Secondary | ICD-10-CM

## 2013-02-04 DIAGNOSIS — D649 Anemia, unspecified: Secondary | ICD-10-CM

## 2013-02-04 DIAGNOSIS — D509 Iron deficiency anemia, unspecified: Secondary | ICD-10-CM

## 2013-02-12 ENCOUNTER — Encounter (INDEPENDENT_AMBULATORY_CARE_PROVIDER_SITE_OTHER): Payer: BC Managed Care – PPO

## 2013-02-12 DIAGNOSIS — D649 Anemia, unspecified: Secondary | ICD-10-CM

## 2013-02-12 DIAGNOSIS — N189 Chronic kidney disease, unspecified: Secondary | ICD-10-CM

## 2013-02-12 DIAGNOSIS — N052 Unspecified nephritic syndrome with diffuse membranous glomerulonephritis: Secondary | ICD-10-CM

## 2013-02-12 DIAGNOSIS — F17200 Nicotine dependence, unspecified, uncomplicated: Secondary | ICD-10-CM

## 2013-02-25 DIAGNOSIS — D509 Iron deficiency anemia, unspecified: Secondary | ICD-10-CM

## 2013-03-18 DIAGNOSIS — N039 Chronic nephritic syndrome with unspecified morphologic changes: Secondary | ICD-10-CM

## 2013-03-18 DIAGNOSIS — D631 Anemia in chronic kidney disease: Secondary | ICD-10-CM

## 2013-03-18 DIAGNOSIS — N184 Chronic kidney disease, stage 4 (severe): Secondary | ICD-10-CM

## 2013-03-19 DIAGNOSIS — D649 Anemia, unspecified: Secondary | ICD-10-CM

## 2013-03-19 DIAGNOSIS — N189 Chronic kidney disease, unspecified: Secondary | ICD-10-CM

## 2013-03-19 DIAGNOSIS — D72829 Elevated white blood cell count, unspecified: Secondary | ICD-10-CM

## 2013-03-19 DIAGNOSIS — M549 Dorsalgia, unspecified: Secondary | ICD-10-CM

## 2013-03-20 DIAGNOSIS — D649 Anemia, unspecified: Secondary | ICD-10-CM

## 2013-04-21 ENCOUNTER — Encounter: Payer: Self-pay | Admitting: Cardiovascular Disease

## 2013-04-21 ENCOUNTER — Ambulatory Visit (INDEPENDENT_AMBULATORY_CARE_PROVIDER_SITE_OTHER): Payer: BC Managed Care – PPO | Admitting: Cardiovascular Disease

## 2013-04-21 VITALS — BP 176/73 | HR 69 | Ht 63.0 in | Wt 183.0 lb

## 2013-04-21 DIAGNOSIS — I429 Cardiomyopathy, unspecified: Secondary | ICD-10-CM | POA: Insufficient documentation

## 2013-04-21 DIAGNOSIS — N184 Chronic kidney disease, stage 4 (severe): Secondary | ICD-10-CM

## 2013-04-21 DIAGNOSIS — E785 Hyperlipidemia, unspecified: Secondary | ICD-10-CM | POA: Insufficient documentation

## 2013-04-21 DIAGNOSIS — Z716 Tobacco abuse counseling: Secondary | ICD-10-CM

## 2013-04-21 DIAGNOSIS — I428 Other cardiomyopathies: Secondary | ICD-10-CM

## 2013-04-21 DIAGNOSIS — Z7189 Other specified counseling: Secondary | ICD-10-CM

## 2013-04-21 DIAGNOSIS — Z720 Tobacco use: Secondary | ICD-10-CM | POA: Insufficient documentation

## 2013-04-21 DIAGNOSIS — Z136 Encounter for screening for cardiovascular disorders: Secondary | ICD-10-CM

## 2013-04-21 DIAGNOSIS — I10 Essential (primary) hypertension: Secondary | ICD-10-CM | POA: Insufficient documentation

## 2013-04-21 DIAGNOSIS — F17200 Nicotine dependence, unspecified, uncomplicated: Secondary | ICD-10-CM

## 2013-04-21 NOTE — Patient Instructions (Signed)
Your physician has requested that you have an echocardiogram. Echocardiography is a painless test that uses sound waves to create images of your heart. It provides your doctor with information about the size and shape of your heart and how well your heart's chambers and valves are working. This procedure takes approximately one hour. There are no restrictions for this procedure. Office will contact with results via phone or letter.   Your physician wants you to follow up in: 6 months.  You will receive a reminder letter in the mail one-two months in advance.  If you don't receive a letter, please call our office to schedule the follow up appointment    

## 2013-04-21 NOTE — Progress Notes (Signed)
Patient ID: Shannon [name], female   DOB: 02-02-1956, 57 y.o.   MRN: [id number]       CARDIOLOGY CONSULT NOTE  Patient ID: Shannon [name] MRN: [id number] DOB/AGE: May 31, 1955 57 y.o.  Admit date: (Not on file) Primary Physician VYAS,DHRUV B., MD  Reason for Consultation:   HPI: The patient is a 57 year old woman with a past medical history significant for nephrotic syndrome and has an idiopathic membranous nephropathy (biopsy in 02/2011, initially evaluated at [facility]) and is followed by nephrology (Dr. Lowanda Foster). She also has hypertension, hypercholesterolemia, bronchitis, hypothyroidism, anemia, and a cardiomyopathy. She has a long-standing history of tobacco use and has smoked one pack per day for more than 20 years. A recent hospitalization at [facility] (03/2013) revealed the following lab results: BUN/creatinine: 25/3.4  An echocardiogram performed on 07/17/2010 revealed normal left ventricular systolic function with an ejection fraction of 60-65%. As per additional hospital and office documentation, it had been 20% at one point. It also showed mild left ventricular hypertrophy, diastolic dysfunction, mild mitral regurgitation, mild tricuspid regurgitation with an RVSP of 40 mmHg.  Her cardiovascular medications include Coreg, hydralazine, lisinopril, metolazone prn, pravastatin, and torsemide 20 mg daily.  She has not taken her blood pressure medications this morning. She thinks she has been doing very well, and denies chest pain, shortness of breath, orthopnea, leg swelling, palpitations, syncope, dizziness, and paroxysmal nocturnal dyspnea. She continues to smoke about a half pack of cigarettes daily.  She has not had any recent hospitalizations.   Allergies  Allergen Reactions  . Corticosteroids Hives, Itching and Nausea And Vomiting  . Rituximab     wheezing    Current Outpatient Prescriptions  Medication Sig Dispense Refill  . albuterol (PROVENTIL HFA;VENTOLIN  HFA) 108 (90 BASE) MCG/ACT inhaler Inhale into the lungs every 6 (six) hours as needed for wheezing.      . carvedilol (COREG) 12.5 MG tablet Take 12.5 mg by mouth 2 (two) times daily with a meal.      . ciprofloxacin (CIPRO) 250 MG tablet Take 250 mg by mouth 2 (two) times daily.      . hydrALAZINE (APRESOLINE) 50 MG tablet Take 50 mg by mouth every 8 (eight) hours.      Marland Kitchen levothyroxine (SYNTHROID, LEVOTHROID) 25 MCG tablet Take 25 mcg by mouth daily.      Marland Kitchen lisinopril (PRINIVIL,ZESTRIL) 40 MG tablet Take 40 mg by mouth daily.      . metolazone (ZAROXOLYN) 5 MG tablet Take 5 mg by mouth 2 (two) times daily as needed.       . ondansetron (ZOFRAN) 4 MG tablet Take 4 mg by mouth every 8 (eight) hours as needed for nausea.      . potassium chloride SA (K-DUR,KLOR-CON) 20 MEQ tablet Take 20 mEq by mouth daily.       . pravastatin (PRAVACHOL) 80 MG tablet Take 80 mg by mouth daily.      . raloxifene (EVISTA) 60 MG tablet Take 60 mg by mouth daily.      . sodium bicarbonate 650 MG tablet Take 650 mg by mouth 3 (three) times daily.      Marland Kitchen torsemide (DEMADEX) 20 MG tablet Take 20 mg by mouth daily.      Marland Kitchen triamcinolone cream (KENALOG) 0.1 % Apply 1 application topically 2 (two) times daily as needed (to skin).       . Vitamin D, Ergocalciferol, (DRISDOL) 50000 UNITS CAPS Take 50,000 Units by mouth every 30 (thirty) days. On  sunday       No current facility-administered medications for this visit.    Past Medical History  Diagnosis Date  . Thyroid disease     hypothyroidism  . Obesity   . Anemia   . Metabolic bone disease   . Gastric ulcer   . CHF (congestive heart failure)     last echo showed EF 20%  . Hyperlipidemia     takes Pravastatin daily  . Chronic kidney disease     Membranous nephropathy ( Seen at [facility])  . Asthma     has Albuterol inhaler prn  . History of bronchitis     last time 3-4yrs ago  . Peripheral edema     takes Torsemide daily  . Itching     on legs-has a Kenalog  cream  . Nausea     takes Zofran prn  . History of blood transfusion     no abnormal reaction noted  . Hypothyroidism     takes Synthroid daily  . Hypertension     takes Imdur and Hydralazine daily as well as Coreg    Past Surgical History  Procedure Laterality Date  . Left arm surgery with pin      15+yrs ago  . Cesarean section      30 +yrs ago  . Colonoscopy    . Av fistula placement Left 07/22/2012    Procedure: INSERTION OF ARTERIOVENOUS GORE-TEX GRAFT ARM;  Surgeon: Angelia Mould, MD;  Location: [facility];  Service: Vascular;  Laterality: Left;    History   Social History  . Marital Status: Single    Spouse Name: N/A    Number of Children: N/A  . Years of Education: N/A   Occupational History  . Not on file.   Social History Main Topics  . Smoking status: Current Every Day Smoker -- 1.00 packs/day for 40 years    Types: Cigarettes    Start date: 05/14/1968  . Smokeless tobacco: Never Used  . Alcohol Use: No     Comment: nothing in 2-48yrs   . Drug Use: No  . Sexual Activity: Not Currently   Other Topics Concern  . Not on file   Social History Narrative  . No narrative on file     Family History  Problem Relation Age of Onset  . Kidney disease Other   . Diabetes Mother      Prior to Admission medications   Medication Sig Start Date End Date Taking? Authorizing Provider  albuterol (PROVENTIL HFA;VENTOLIN HFA) 108 (90 BASE) MCG/ACT inhaler Inhale into the lungs every 6 (six) hours as needed for wheezing.   Yes Historical Provider, MD  carvedilol (COREG) 12.5 MG tablet Take 12.5 mg by mouth 2 (two) times daily with a meal.   Yes Historical Provider, MD  ciprofloxacin (CIPRO) 250 MG tablet Take 250 mg by mouth 2 (two) times daily. 04/15/13 04/22/13 Yes Historical Provider, MD  hydrALAZINE (APRESOLINE) 50 MG tablet Take 50 mg by mouth every 8 (eight) hours.   Yes Historical Provider, MD  levothyroxine (SYNTHROID, LEVOTHROID) 25 MCG tablet Take 25 mcg by  mouth daily.   Yes Historical Provider, MD  lisinopril (PRINIVIL,ZESTRIL) 40 MG tablet Take 40 mg by mouth daily.   Yes Historical Provider, MD  metolazone (ZAROXOLYN) 5 MG tablet Take 5 mg by mouth 2 (two) times daily as needed.    Yes Historical Provider, MD  ondansetron (ZOFRAN) 4 MG tablet Take 4 mg by mouth every 8 (eight) hours as  needed for nausea.   Yes Historical Provider, MD  potassium chloride SA (K-DUR,KLOR-CON) 20 MEQ tablet Take 20 mEq by mouth daily.    Yes Historical Provider, MD  pravastatin (PRAVACHOL) 80 MG tablet Take 80 mg by mouth daily.   Yes Historical Provider, MD  raloxifene (EVISTA) 60 MG tablet Take 60 mg by mouth daily.   Yes Historical Provider, MD  sodium bicarbonate 650 MG tablet Take 650 mg by mouth 3 (three) times daily.   Yes Historical Provider, MD  torsemide (DEMADEX) 20 MG tablet Take 20 mg by mouth daily.   Yes Historical Provider, MD  triamcinolone cream (KENALOG) 0.1 % Apply 1 application topically 2 (two) times daily as needed (to skin).    Yes Historical Provider, MD  Vitamin D, Ergocalciferol, (DRISDOL) 50000 UNITS CAPS Take 50,000 Units by mouth every 30 (thirty) days. On sunday   Yes Historical Provider, MD     Review of systems complete and found to be negative unless listed above in HPI     Physical exam Blood pressure 176/73, pulse 69, height 5\' 3"  (1.6 m), weight 183 lb (83.008 kg). General: NAD Neck: No JVD, no thyromegaly or thyroid nodule.  Lungs: Clear to auscultation bilaterally with normal respiratory effort. CV: Nondisplaced PMI.  Heart regular S1/S2, no S3/S4, soft I/VI holosystolic murmur along left sternal border.  No peripheral edema.  No carotid bruit.  Normal pedal pulses.  Abdomen: Soft, nontender, no hepatosplenomegaly, no distention.  Skin: Intact without lesions or rashes.  Neurologic: Alert and oriented x 3.  Psych: Normal affect. Extremities: No clubbing or cyanosis.  HEENT: Normal.   Labs:   Lab Results    Component Value Date   HGB 11.2* 07/22/2012   HCT 33.0* 07/22/2012   No results found for this basename: NA, K, CL, CO2, BUN, CREATININE, CALCIUM, LABALBU, PROT, BILITOT, ALKPHOS, ALT, AST, GLUCOSE,  in the last 168 hours No results found for this basename: CKTOTAL, CKMB, CKMBINDEX, TROPONINI    No results found for this basename: CHOL   No results found for this basename: HDL   No results found for this basename: LDLCALC   No results found for this basename: TRIG   No results found for this basename: CHOLHDL   No results found for this basename: LDLDIRECT       EKG: Sinus rhythm, rate 68 bpm, axis within normal limits, intervals within normal limits, no acute ST-T wave changes.  Studies: No results found.  ASSESSMENT AND PLAN: 1. Nonischemic cardiomyopathy/chronic systolic heart failure: she reportedly underwent cardiac catheterization several years ago for the evaluation of her cardiomyopathy, which apparently did not reveal any significant blockages as per patient (I do not have the official report). She also appears to be symptomatically stable and euvolemic. I will not make adjustments to her current diuretic regimen. I will obtain an echocardiogram to see if there has been any interval change in her systolic and diastolic function as it has been approximately 3 years since her last echocardiogram. I will continue carvedilol and lisinopril at current doses. 2. HTN: appears to be well controlled as per patient. I've asked her to keep a record of her BP's at her office visits each month so that I have a better appreciation for what they normally are, so that I can adjust medications appropriately. It is elevated today but she has yet to take her medications. 3. Hyperlipidemia: on pravastatin 80 mg daily. 4. CKD with membranous nephropathy: not on dialysis. Followed by Dr. Lowanda Foster.  5. Tobacco abuse: cessation counseling provided.  Signed: Kate Sable, M.D.,  F.A.C.C.  04/21/2013, 8:34 AM

## 2013-04-29 ENCOUNTER — Other Ambulatory Visit: Payer: Self-pay

## 2013-04-29 ENCOUNTER — Other Ambulatory Visit (INDEPENDENT_AMBULATORY_CARE_PROVIDER_SITE_OTHER): Payer: BC Managed Care – PPO

## 2013-04-29 DIAGNOSIS — I429 Cardiomyopathy, unspecified: Secondary | ICD-10-CM

## 2013-04-29 DIAGNOSIS — I428 Other cardiomyopathies: Secondary | ICD-10-CM

## 2013-04-29 DIAGNOSIS — I359 Nonrheumatic aortic valve disorder, unspecified: Secondary | ICD-10-CM

## 2015-09-09 ENCOUNTER — Other Ambulatory Visit: Payer: Self-pay | Admitting: *Deleted

## 2015-09-09 DIAGNOSIS — T82511D Breakdown (mechanical) of surgically created arteriovenous shunt, subsequent encounter: Secondary | ICD-10-CM

## 2015-09-28 ENCOUNTER — Encounter: Payer: Self-pay | Admitting: Vascular Surgery

## 2015-10-05 ENCOUNTER — Encounter: Payer: Self-pay | Admitting: Vascular Surgery

## 2015-10-05 ENCOUNTER — Ambulatory Visit (INDEPENDENT_AMBULATORY_CARE_PROVIDER_SITE_OTHER): Payer: Medicare Other | Admitting: Vascular Surgery

## 2015-10-05 ENCOUNTER — Ambulatory Visit (HOSPITAL_COMMUNITY)
Admission: RE | Admit: 2015-10-05 | Discharge: 2015-10-05 | Disposition: A | Discharge status: Home / Self Care | Payer: Medicare Other | Source: Ambulatory Visit | Attending: Vascular Surgery | Admitting: Vascular Surgery

## 2015-10-05 VITALS — BP 132/75 | HR 66 | Temp 98.3°F | Resp 18 | Ht 64.0 in | Wt 222.2 lb

## 2015-10-05 DIAGNOSIS — T82511D Breakdown (mechanical) of surgically created arteriovenous shunt, subsequent encounter: Secondary | ICD-10-CM | POA: Insufficient documentation

## 2015-10-05 DIAGNOSIS — N184 Chronic kidney disease, stage 4 (severe): Secondary | ICD-10-CM

## 2015-10-05 NOTE — Progress Notes (Signed)
[facility] [place]  Patient name: Shannon [name] MRN: [id number] DOB: February 17, 1956 Sex: female  REASON FOR VISIT: appendectomy evaluate for new hemodialysis access.  HPI: Shannon [name] is a 60 y.o. female who had a new left upper arm AV graft placed in March 2014. At that time the patient was not on dialysis. Preoperative vein mapping showed that she did not have adequate vein for a fistula.  I have reviewed the records from Dr. Florentina Addison office. Patient has chronic renal failure secondary to membranous nephropathy. When the patient was last seen on 08/30/2015, there was no thrill or bruit in the graft that had been placed in March 2014.  I have reviewed her records and it looks like her kidney function has been stable. She denies any recent uremic symptoms. Specifically she denies nausea, vomiting, fatigue, anorexia, or palpitations.  Past Medical History  Diagnosis Date  . Thyroid disease     hypothyroidism  . Obesity   . Anemia   . Metabolic bone disease   . Gastric ulcer   . CHF (congestive heart failure) ([place])     last echo showed EF 20%  . Hyperlipidemia     takes Pravastatin daily  . Chronic kidney disease     Membranous nephropathy ( Seen at [facility])  . Asthma     has Albuterol inhaler prn  . History of bronchitis     last time 3-36yrs ago  . Peripheral edema     takes Torsemide daily  . Itching     on legs-has a Kenalog cream  . Nausea     takes Zofran prn  . History of blood transfusion     no abnormal reaction noted  . Hypothyroidism     takes Synthroid daily  . Hypertension     takes Imdur and Hydralazine daily as well as Coreg    Family History  Problem Relation Age of Onset  . Kidney disease Other   . Diabetes Mother     SOCIAL HISTORY: Social History  Substance Use Topics  . Smoking status: Current Every Day Smoker -- 1.00 packs/day for 40 years    Types: Cigarettes    Start date: 05/14/1968  . Smokeless tobacco:  Never Used  . Alcohol Use: No     Comment: nothing in 2-71yrs     Allergies  Allergen Reactions  . Corticosteroids Hives, Itching and Nausea And Vomiting  . Rituximab     wheezing    Current Outpatient Prescriptions  Medication Sig Dispense Refill  . albuterol (PROVENTIL HFA;VENTOLIN HFA) 108 (90 BASE) MCG/ACT inhaler Inhale into the lungs every 6 (six) hours as needed for wheezing.    . carvedilol (COREG) 12.5 MG tablet Take 12.5 mg by mouth 2 (two) times daily with a meal.    . hydrALAZINE (APRESOLINE) 50 MG tablet Take 50 mg by mouth every 8 (eight) hours.    . isosorbide mononitrate (IMDUR) 30 MG 24 hr tablet Take 30 mg by mouth daily.    Marland Kitchen lisinopril (PRINIVIL,ZESTRIL) 40 MG tablet Take 40 mg by mouth daily.    . ondansetron (ZOFRAN) 4 MG tablet Take 4 mg by mouth every 8 (eight) hours as needed for nausea.    . pravastatin (PRAVACHOL) 80 MG tablet Take 80 mg by mouth daily.    . raloxifene (EVISTA) 60 MG tablet Take 60 mg by mouth daily.    . sodium bicarbonate 650 MG tablet Take 650 mg by mouth 3 (three) times  daily.    . torsemide (DEMADEX) 20 MG tablet Take 20 mg by mouth daily.    Marland Kitchen triamcinolone cream (KENALOG) 0.1 % Apply 1 application topically 2 (two) times daily as needed (to skin).     . Vitamin D, Ergocalciferol, (DRISDOL) 50000 UNITS CAPS Take 50,000 Units by mouth every 30 (thirty) days. On sunday    . levothyroxine (SYNTHROID, LEVOTHROID) 25 MCG tablet Take 25 mcg by mouth daily. Reported on 10/05/2015    . metolazone (ZAROXOLYN) 5 MG tablet Take 5 mg by mouth 2 (two) times daily as needed. Reported on 10/05/2015    . potassium chloride SA (K-DUR,KLOR-CON) 20 MEQ tablet Take 20 mEq by mouth daily. Reported on 10/05/2015     No current facility-administered medications for this visit.    REVIEW OF SYSTEMS:  [X]  denotes positive finding, [ ]  denotes negative finding Cardiac  Comments:  Chest pain or chest pressure:    Shortness of breath upon exertion:    Short of  breath when lying flat:    Irregular heart rhythm:        Vascular    Pain in calf, thigh, or hip brought on by ambulation:    Pain in feet at night that wakes you up from your sleep:     Blood clot in your veins:    Leg swelling:         Pulmonary    Oxygen at home:    Productive cough:     Wheezing:         Neurologic    Sudden weakness in arms or legs:     Sudden numbness in arms or legs:     Sudden onset of difficulty speaking or slurred speech:    Temporary loss of vision in one eye:     Problems with dizziness:         Gastrointestinal    Blood in stool:     Vomited blood:         Genitourinary    Burning when urinating:     Blood in urine:        Psychiatric    Major depression:         Hematologic    Bleeding problems:    Problems with blood clotting too easily:        Skin    Rashes or ulcers:        Constitutional    Fever or chills:      PHYSICAL EXAM: Filed Vitals:   10/05/15 1202  BP: 132/75  Pulse: 66  Temp: 98.3 F (36.8 C)  TempSrc: Oral  Resp: 18  Height: 5\' 4"  (1.626 m)  Weight: 222 lb 3.2 oz (100.789 kg)  SpO2: 99%    GENERAL: The patient is a well-nourished female, in no acute distress. The vital signs are documented above. CARDIAC: There is a regular rate and rhythm.  VASCULAR: she has palpable radial pulses bilaterally. The left upper arm graft does not have a bruit or thrill. PULMONARY: There is good air exchange bilaterally without wheezing or rales. ABDOMEN: Soft and non-tender with normal pitched bowel sounds.  MUSCULOSKELETAL: There are no major deformities or cyanosis. NEUROLOGIC: No focal weakness or paresthesias are detected. SKIN: There are no ulcers or rashes noted. PSYCHIATRIC: The patient has a normal affect.  DATA:   UPPER EXTREMITY VEIN MAP: I have reviewed the previous vein map from 2014. The patient does not appear to have an adequate vein in either arm for  a fistula.  Labs on 08/25/2015 show a creatinine of  3.6.  MEDICAL ISSUES:  STAGE IV CHRONIC KIDNEY DISEASE: It is not clear how long her left upper arm graft has been occluded. This reason, it is unlikely that thrombectomy would be successful. Therefore I would recommend placement of a new graft. She feels strongly about having the graft placed in the left arm which I think is reasonable. Although her upper arm graft does extend fairly high in the axilla it looks like I could potentially get a little bit higher. I've also explained to her that when we plan on doing an new graft I would try to thrombectomize her current graft although if it's been occluded for a long time is unlikely this will be successful.  Based on her previous vein map she is not a candidate for a fistula. Given that the graft can be used one month postop I would favor placing the new graft once she is closer to needing dialysis given that she had her current graft for 3 years and it was not used. Therefore when she is closer to needing dialysis we'll schedule her for placement of a new left upper arm graft.  [name] [facility] [place] [phone]

## 2015-10-06 ENCOUNTER — Encounter: Payer: Self-pay | Admitting: Nephrology

## 2015-11-10 ENCOUNTER — Encounter (HOSPITAL_COMMUNITY): Payer: Medicare Other | Attending: Oncology | Admitting: Oncology

## 2015-11-10 ENCOUNTER — Encounter (HOSPITAL_COMMUNITY): Payer: Medicare Other

## 2015-11-10 ENCOUNTER — Encounter (HOSPITAL_COMMUNITY): Payer: Self-pay | Admitting: Oncology

## 2015-11-10 VITALS — HR 61 | Temp 98.4°F | Resp 16 | Ht 63.0 in | Wt 224.6 lb

## 2015-11-10 DIAGNOSIS — D631 Anemia in chronic kidney disease: Secondary | ICD-10-CM | POA: Diagnosis not present

## 2015-11-10 DIAGNOSIS — D649 Anemia, unspecified: Secondary | ICD-10-CM | POA: Diagnosis present

## 2015-11-10 DIAGNOSIS — I8500 Esophageal varices without bleeding: Secondary | ICD-10-CM | POA: Diagnosis not present

## 2015-11-10 DIAGNOSIS — Q2733 Arteriovenous malformation of digestive system vessel: Secondary | ICD-10-CM

## 2015-11-10 DIAGNOSIS — N189 Chronic kidney disease, unspecified: Secondary | ICD-10-CM | POA: Insufficient documentation

## 2015-11-10 DIAGNOSIS — K5520 Angiodysplasia of colon without hemorrhage: Secondary | ICD-10-CM

## 2015-11-10 DIAGNOSIS — N184 Chronic kidney disease, stage 4 (severe): Secondary | ICD-10-CM | POA: Diagnosis not present

## 2015-11-10 HISTORY — DX: Anemia in chronic kidney disease: N18.9

## 2015-11-10 HISTORY — DX: Anemia, unspecified: D64.9

## 2015-11-10 HISTORY — DX: Anemia in chronic kidney disease: D63.1

## 2015-11-10 LAB — CBC WITH DIFFERENTIAL/PLATELET
BASOS PCT: 0 %
Basophils Absolute: 0 10*3/uL (ref 0.0–0.1)
EOS ABS: 0.1 10*3/uL (ref 0.0–0.7)
EOS PCT: 2 %
HCT: 29.8 % — ABNORMAL LOW (ref 36.0–46.0)
HEMOGLOBIN: 9.6 g/dL — AB (ref 12.0–15.0)
LYMPHS PCT: 18 %
Lymphs Abs: 1.4 10*3/uL (ref 0.7–4.0)
MCH: 30.7 pg (ref 26.0–34.0)
MCHC: 32.2 g/dL (ref 30.0–36.0)
MCV: 95.2 fL (ref 78.0–100.0)
MONO ABS: 0.2 10*3/uL (ref 0.1–1.0)
MONOS PCT: 3 %
NEUTROS ABS: 6 10*3/uL (ref 1.7–7.7)
NEUTROS PCT: 77 %
PLATELETS: 184 10*3/uL (ref 150–400)
RBC: 3.13 MIL/uL — ABNORMAL LOW (ref 3.87–5.11)
RDW: 14.9 % (ref 11.5–15.5)
WBC: 7.8 10*3/uL (ref 4.0–10.5)

## 2015-11-10 LAB — LACTATE DEHYDROGENASE: LDH: 224 U/L — ABNORMAL HIGH (ref 98–192)

## 2015-11-10 LAB — COMPREHENSIVE METABOLIC PANEL
ALBUMIN: 3.3 g/dL — AB (ref 3.5–5.0)
ALT: 13 U/L — ABNORMAL LOW (ref 14–54)
ANION GAP: 5 (ref 5–15)
AST: 19 U/L (ref 15–41)
Alkaline Phosphatase: 73 U/L (ref 38–126)
BUN: 36 mg/dL — ABNORMAL HIGH (ref 6–20)
CHLORIDE: 111 mmol/L (ref 101–111)
CO2: 22 mmol/L (ref 22–32)
Calcium: 8.3 mg/dL — ABNORMAL LOW (ref 8.9–10.3)
Creatinine, Ser: 3.59 mg/dL — ABNORMAL HIGH (ref 0.44–1.00)
GFR calc Af Amer: 15 mL/min — ABNORMAL LOW (ref >60)
GFR calc non Af Amer: 13 mL/min — ABNORMAL LOW (ref >60)
GLUCOSE: 94 mg/dL (ref 65–99)
POTASSIUM: 4.8 mmol/L (ref 3.5–5.1)
SODIUM: 138 mmol/L (ref 135–145)
Total Bilirubin: 0.5 mg/dL (ref 0.3–1.2)
Total Protein: 6 g/dL — ABNORMAL LOW (ref 6.5–8.1)

## 2015-11-10 LAB — FERRITIN: Ferritin: 283 ng/mL (ref 11–307)

## 2015-11-10 LAB — SEDIMENTATION RATE: SED RATE: 53 mm/h — AB (ref 0–22)

## 2015-11-10 LAB — IRON AND TIBC
Iron: 52 ug/dL (ref 28–170)
SATURATION RATIOS: 23 % (ref 10.4–31.8)
TIBC: 228 ug/dL — AB (ref 250–450)
UIBC: 176 ug/dL

## 2015-11-10 LAB — FOLATE: FOLATE: 16.8 ng/mL (ref >5.9)

## 2015-11-10 LAB — RETICULOCYTES
RBC.: 3.13 MIL/uL — ABNORMAL LOW (ref 3.87–5.11)
Retic Count, Absolute: 62.6 10*3/uL (ref 19.0–186.0)
Retic Ct Pct: 2 % (ref 0.4–3.1)

## 2015-11-10 LAB — C-REACTIVE PROTEIN: CRP: <0.5 mg/dL (ref <1.0)

## 2015-11-10 LAB — VITAMIN B12: VITAMIN B 12: 260 pg/mL (ref 180–914)

## 2015-11-10 MED ORDER — DARBEPOETIN ALFA 100 MCG/0.5ML IJ SOSY
PREFILLED_SYRINGE | INTRAMUSCULAR | Status: AC
  Filled 2015-11-10: qty 0.5

## 2015-11-10 MED ORDER — DARBEPOETIN ALFA 100 MCG/0.5ML IJ SOSY
80.0000 ug | PREFILLED_SYRINGE | Freq: Once | INTRAMUSCULAR | Status: AC
  Administered 2015-11-10: 80 ug via SUBCUTANEOUS

## 2015-11-10 NOTE — Patient Instructions (Addendum)
Shannon [name] at [facility] Discharge Instructions  RECOMMENDATIONS MADE BY THE CONSULTANT AND ANY TEST RESULTS WILL BE SENT TO YOUR REFERRING PHYSICIAN.  You were seen by Kirby Crigler, PA today.  You will receive Aranesp injection today if your hemoglobin level is less than 11. Your hemoglobin level is 9.6 today. Aranesp plan is for injections every 2 weeks. Return in 6-8 weeks for follow-up appointment. Call clinic with any questions or concerns.    Thank you for choosing [place] [facility] to provide your oncology and hematology care.  To afford each patient quality time with our provider, please arrive at least 15 minutes before your scheduled appointment time.   Beginning January 23rd 2017 lab work for the [facility] will be done in the  Main lab at [facility] on 1st floor. If you have a lab appointment with the [facility] please come in thru the  Main Entrance and check in at the main information desk  You need to re-schedule your appointment should you arrive 10 or more minutes late.  We strive to give you quality time with our providers, and arriving late affects you and other patients whose appointments are after yours.  Also, if you no show three or more times for appointments you may be dismissed from the clinic at the providers discretion.     Again, thank you for choosing [facility].  Our hope is that these requests will decrease the amount of time that you wait before being seen by our physicians.       _____________________________________________________________  Should you have questions after your visit to [facility], please contact our office at [phone] between the hours of 8:30 a.m. and 4:30 p.m.  Voicemails left after 4:30 p.m. will not be returned until the following business day.  For prescription refill requests, have your pharmacy contact our office.         Resources For  Cancer Patients and their Caregivers ? American Cancer Society: Can assist with transportation, wigs, general needs, runs Look Good Feel Better.        862-527-8789 ? Cancer Care: Provides financial assistance, online support groups, medication/co-pay assistance.  1-800-813-HOPE 651 110 9855) ? [facility] Assists [place] cancer patients and their families through emotional , educational and financial support.  618-761-5925 ? [place] DSS Where to apply for food stamps, Medicaid and utility assistance. 307 286 5299 ? [facility]: Transportation to medical appointments. (956)441-0367 ? Social Security Administration: May apply for disability if have a Stage IV cancer. 551 441 9161 236 304 5785 ? [employer], Disability and Transit Services: Assists with nutrition, care and transit needs. [place] Support Programs: @10RELATIVEDAYS @ > Cancer Support Group  2nd Tuesday of the month 1pm-2pm, Journey Room  > Creative Journey  3rd Tuesday of the month 1130am-1pm, Journey Room  > Look Good Feel Better  1st Wednesday of the month 10am-12 noon, Journey Room (Call [place] to register [phone])

## 2015-11-10 NOTE — Progress Notes (Signed)
Shannon [name] presents today for injection per MD orders. Aranesp 80mcg administered SQ in right Abdomen. Administration without incident. Patient tolerated well.  

## 2015-11-10 NOTE — Progress Notes (Signed)
[facility] Hematology/Oncology Consultation   Name: Shannon [name]      MRN: [id number]    Location: Room/bed info not found  Date: 11/10/2015 Time:6:45 PM   REFERRING PHYSICIAN:  Audree Camel, MD (Medical Oncology)  REASON FOR CONSULT:  Transfer of hematologic care   DIAGNOSIS:  Anemia of chronic renal disease, Stage IV  HISTORY OF PRESENT ILLNESS:   Shannon [name] is a 60 y.o. female with a medical history significant for CHF, MI, HTN, asthma, obesity, AVMs and esophageal varices who is referred to the [facility] for anemia of chronic renal disease, on Aranesp every 4 weeks at a dose of 500 g.  I personally reviewed and went over laboratory results with the patient.  The results are noted within this dictation.  I personally reviewed and went over radiographic studies with the patient.  The results are noted within this dictation.    Chart is reviewed.  The patient's nephrologist is Fran Lowes, MD (Nephrology).  She note that her renal function has been stable x 3 years.  She has not needed dialysis.  She has been receiving Aranesp at a dose of 500 g every 4 weeks. Additionally, her Aranesp was held for a hemoglobin greater than 10 g/dL.  She is unable to confirm whether she has gastrointestinal AVMs and/or esophageal varices. She denies having colonoscopy and EGD. We will have to further investigate this moving forward. Nevertheless, does not change treatment at this point in time.  She denies any complaints today. She denies BRBPR, no black or tarry stool.   Review of Systems  Constitutional: Negative for fever, chills, weight loss and malaise/fatigue.  HENT: Negative for nosebleeds and sore throat.   Eyes: Negative for blurred vision, double vision and photophobia.  Respiratory: Negative for cough, hemoptysis, sputum production, shortness of breath and wheezing.   Cardiovascular: Negative for chest pain, palpitations,  claudication and leg swelling.  Gastrointestinal: Negative for nausea, vomiting, abdominal pain, diarrhea, constipation, blood in stool and melena.  Genitourinary: Negative for dysuria, urgency, frequency and hematuria.  Musculoskeletal: Negative for falls.  Skin: Negative for itching and rash.  Neurological: Negative for dizziness, sensory change, speech change, focal weakness, seizures, loss of consciousness, weakness and headaches.  Endo/Heme/Allergies: Does not bruise/bleed easily.  Psychiatric/Behavioral: Negative.   14 point review of systems was performed and is negative except as detailed under history of present illness and above   PAST MEDICAL HISTORY:   Past Medical History  Diagnosis Date  . Thyroid disease     hypothyroidism  . Obesity   . Anemia   . Metabolic bone disease   . Gastric ulcer   . CHF (congestive heart failure) ([place])     last echo showed EF 20%  . Hyperlipidemia     takes Pravastatin daily  . Chronic kidney disease     Membranous nephropathy ( Seen at [facility])  . Asthma     has Albuterol inhaler prn  . History of bronchitis     last time 3-51yr ago  . Peripheral edema     takes Torsemide daily  . Itching     on legs-has a Kenalog cream  . Nausea     takes Zofran prn  . History of blood transfusion     no abnormal reaction noted  . Hypothyroidism     takes Synthroid daily  . Hypertension     takes Imdur and Hydralazine daily as well as  Coreg  . Absolute anemia 11/10/2015  . Anemia of renal disease 11/10/2015    ALLERGIES: Allergies  Allergen Reactions  . Corticosteroids Hives, Itching and Nausea And Vomiting  . Rituximab     wheezing      MEDICATIONS: I have reviewed the patient's current medications.    Current Outpatient Prescriptions on File Prior to Visit  Medication Sig Dispense Refill  . albuterol (PROVENTIL HFA;VENTOLIN HFA) 108 (90 BASE) MCG/ACT inhaler Inhale into the lungs every 6 (six) hours as needed for wheezing.    .  carvedilol (COREG) 12.5 MG tablet Take 12.5 mg by mouth 2 (two) times daily with a meal.    . hydrALAZINE (APRESOLINE) 50 MG tablet Take 50 mg by mouth every 8 (eight) hours.    . isosorbide mononitrate (IMDUR) 30 MG 24 hr tablet Take 30 mg by mouth daily.    Marland Kitchen levothyroxine (SYNTHROID, LEVOTHROID) 25 MCG tablet Take 25 mcg by mouth daily. Reported on 10/05/2015    . lisinopril (PRINIVIL,ZESTRIL) 40 MG tablet Take 40 mg by mouth daily.    . metolazone (ZAROXOLYN) 5 MG tablet Take 5 mg by mouth 2 (two) times daily as needed. Reported on 10/05/2015    . ondansetron (ZOFRAN) 4 MG tablet Take 4 mg by mouth every 8 (eight) hours as needed for nausea.    . potassium chloride SA (K-DUR,KLOR-CON) 20 MEQ tablet Take 20 mEq by mouth daily. Reported on 10/05/2015    . pravastatin (PRAVACHOL) 80 MG tablet Take 80 mg by mouth daily.    . raloxifene (EVISTA) 60 MG tablet Take 60 mg by mouth daily.    . sodium bicarbonate 650 MG tablet Take 650 mg by mouth 3 (three) times daily.    Marland Kitchen torsemide (DEMADEX) 20 MG tablet Take 20 mg by mouth daily.    Marland Kitchen triamcinolone cream (KENALOG) 0.1 % Apply 1 application topically 2 (two) times daily as needed (to skin).     . Vitamin D, Ergocalciferol, (DRISDOL) 50000 UNITS CAPS Take 50,000 Units by mouth every 30 (thirty) days. On _0 +yrs ago  . Colonoscopy    . Av fistula placement Left 07/22/2012    Procedure: INSERTION OF ARTERIOVENOUS GORE-TEX GRAFT ARM;  Surgeon: Angelia Mould, MD;  Location: [facility] OR;  Service: Vascular;  Laterality: Left;    FAMILY HISTORY: Family History  Problem Relation Age of Onset  . Kidney disease Other   . Diabetes Mother     SOCIAL HISTORY:  reports that she has been smoking Cigarettes.  She started smoking  about 47 years ago. She has a 40 pack-year smoking history. She has never used smokeless tobacco. She reports that she does not drink alcohol or use illicit drugs.  Social History   Social History  . Marital Status: Single    Spouse Name: N/A  . Number of Children: N/A  . Years of Education: N/A   Social History Main Topics  . Smoking status: Current Every Day Smoker -- 1.00 packs/day for 40 years    Types: Cigarettes    Start date: 05/14/1968  . Smokeless tobacco: Never Used  . Alcohol Use: No     Comment: nothing in 2-9yr   . Drug Use:  No  . Sexual Activity: Not Currently   Other Topics Concern  . None   Social History Narrative    PERFORMANCE STATUS: The patient's performance status is 1 - Symptomatic but completely ambulatory  PHYSICAL EXAM: Most Recent Vital Signs: Pulse 61, temperature 98.4 F (36.9 C), temperature source Oral, resp. rate 16, height _0  (1.6 m), weight 224 lb 9.6 oz (101.878 kg), SpO2 100 %. General appearance: alert, appears stated age, no distress, moderately obese and pleasant and unaccompanied Head: Normocephalic, without obvious abnormality, atraumatic Eyes: negative findings: lids and lashes normal, conjunctivae and sclerae normal and corneas clear Throat: normal findings: oropharynx pink & moist without lesions or evidence of thrush Neck: no adenopathy and supple, symmetrical, trachea midline Lungs: clear to auscultation bilaterally and normal percussion bilaterally Heart: regular rate and rhythm, S1, S2 normal, no murmur, click, rub or gallop Extremities: extremities normal, atraumatic, no cyanosis or edema Skin: Skin color, texture, turgor normal. No rashes or lesions Lymph nodes: Cervical, supraclavicular, and axillary nodes normal. Neurologic: Alert and oriented X 3, normal strength and tone. Normal symmetric reflexes. Normal coordination and gait  LABORATORY DATA:  Results for orders placed or performed in visit on 11/10/15 (from the  past 48 hour(s))  CBC with Differential     Status: Abnormal   Collection Time: 11/10/15 12:28 PM  Result Value Ref Range   WBC 7.8 4.0 - 10.5 K/uL   RBC 3.13 (L) 3.87 - 5.11 MIL/uL   Hemoglobin 9.6 (L) 12.0 - 15.0 g/dL   HCT 29.8 (L) 36.0 - 46.0 %   MCV 95.2 78.0 - 100.0 fL   MCH 30.7 26.0 - 34.0 pg   MCHC 32.2 30.0 - 36.0 g/dL   RDW 14.9 11.5 - 15.5 %   Platelets 184 150 - 400 K/uL   Neutrophils Relative % 77 %   Neutro Abs 6.0 1.7 - 7.7 K/uL   Lymphocytes Relative 18 %   Lymphs Abs 1.4 0.7 - 4.0 K/uL   Monocytes Relative 3 %   Monocytes Absolute 0.2 0.1 - 1.0 K/uL   Eosinophils Relative 2 %   Eosinophils Absolute 0.1 0.0 - 0.7 K/uL   Basophils Relative 0 %   Basophils Absolute 0.0 0.0 - 0.1 K/uL  Comprehensive metabolic panel     Status: Abnormal   Collection Time: 11/10/15 12:28 PM  Result Value Ref Range   Sodium 138 135 - 145 mmol/L   Potassium 4.8 3.5 - 5.1 mmol/L   Chloride 111 101 - 111 mmol/L   CO2 22 22 - 32 mmol/L   Glucose, Bld 94 65 - 99 mg/dL   BUN 36 (H) 6 - 20 mg/dL   Creatinine, Ser 3.59 (H) 0.44 - 1.00 mg/dL   Calcium 8.3 (L) 8.9 - 10.3 mg/dL   Total Protein 6.0 (L) 6.5 - 8.1 g/dL   Albumin 3.3 (L) 3.5 - 5.0 g/dL   AST 19 15 - 41 U/L   ALT 13 (L) 14 - 54 U/L   Alkaline Phosphatase 73 38 - 126 U/L   Total Bilirubin 0.5 0.3 - 1.2 mg/dL   GFR calc non Af Amer 13 (L) >60 mL/min   GFR calc Af Amer 15 (L) >60 mL/min    Comment: (NOTE) The eGFR has been calculated using the CKD EPI equation. This calculation has not been validated in all clinical situations. eGFR's persistently <60 mL/min signify possible Chronic Kidney Disease.    Anion gap 5 5 - 15  Lactate dehydrogenase  Status: Abnormal   Collection Time: 11/10/15 12:28 PM  Result Value Ref Range   LDH 224 (H) 98 - 192 U/L  Sedimentation rate     Status: Abnormal   Collection Time: 11/10/15 12:28 PM  Result Value Ref Range   Sed Rate 53 (H) 0 - 22 mm/hr  Reticulocytes     Status: Abnormal    Collection Time: 11/10/15 12:28 PM  Result Value Ref Range   Retic Ct Pct 2.0 0.4 - 3.1 %   RBC. 3.13 (L) 3.87 - 5.11 MIL/uL   Retic Count, Manual 62.6 19.0 - 186.0 K/uL  Folate     Status: None   Collection Time: 11/10/15 12:29 PM  Result Value Ref Range   Folate 16.8 >5.9 ng/mL    Comment: Performed at [place]: No results found.     PATHOLOGY:  N/A  ASSESSMENT/PLAN:  Absolute anemia Anemia secondary to stage IV CKD History of AVMs History of esophageal varices  Normocytic, normochromic anemia having been on Aranesp every 4 weeks by Dr. Jacquiline Doe at [facility] in [place], [place] secondary to chronic renal disease, Stage IV.    Her chart is reviewed in detail.  It is noted that she also has a reported history of esophageal varices and GI AVMs.    Anemia work-up today: CBC diff, CMET, LDH, ESR, CRP, pathology smear review, anemia panel, EPO level, and retic count, SPEP with IFE and light chain assay.  I personally reviewed and went over laboratory results with the patient.  The results are noted within this dictation.  She meets parameters for Aranesp today.  She is educated on the appropriate dosing schedule and dosing for Aranesp for anemia of chronic renal disease.  Dosing is 0.75 mcg/kg every 2 weeks up to a HGB of 11 g/dL.  This is a change to her past treatment but she is agreeable to proceed.   She will return every 2 weeks for labs and ESA treatment if parameters are met. Ferritin levels will be followed accordingly every 90 days. Goal is to keep ferritin greater than or equal to 100 ng/ml.   Return in 6-8 weeks for follow-up and evaluation of dosing of ESA.    ORDERS PLACED FOR THIS ENCOUNTER: Orders Placed This Encounter  Procedures  . CBC with Differential  . Comprehensive metabolic panel  . Lactate dehydrogenase  . Sedimentation rate  . Vitamin B12  . Folate  . Iron and TIBC  . Ferritin  . Reticulocytes  .  Erythropoietin  . Kappa/lambda light chains  . Beta 2 microglobuline, serum  . IgG, IgA, IgM  . Immunofixation electrophoresis  . Protein electrophoresis, serum  . Pathologist smear review  . C-reactive protein  . CBC  . Basic metabolic panel  . Ferritin    MEDICATIONS PRESCRIBED THIS ENCOUNTER: Meds ordered this encounter  Medications  . Darbepoetin Alfa (ARANESP) injection 80 mcg    Sig:     All questions were answered. The patient knows to call the clinic with any problems, questions or concerns. We can certainly see the patient much sooner if necessary.  This note is electronically signed [id number] Cyril Mourning, MD  11/10/2015 6:45 PM

## 2015-11-10 NOTE — Assessment & Plan Note (Addendum)
Normocytic, normochromic anemia having been on Aranesp every 4 weeks by Dr. Jacquiline Doe at [facility] in [place], [place] secondary to chronic renal disease, Stage IV.    Her chart is reviewed in detail.  It is noted that she also has a reported history of esophageal varices and GI AVMs.    Anemia work-up today: CBC diff, CMET, LDH, ESR, CRP, pathology smear review, anemia panel, EPO level, and retic count, SPEP with IFE and light chain assay.  I personally reviewed and went over laboratory results with the patient.  The results are noted within this dictation.  She meets parameters for Aranesp today.  She is educated on the appropriate dosing schedule and dosing for Aranesp for anemia of chronic renal disease.  Dosing is 0.75 mcg/kg every 2 weeks up to a HGB of 11 g/dL.  This is a change to her past treatment which was not following protocol for ESA therapy in chronic renal disease.  She will return every 2 weeks for labs and ESA treatment if parameters are met.  Return in 6-8 weeks for follow-up and evaluation of dosing of ESA.

## 2015-11-11 LAB — ERYTHROPOIETIN: ERYTHROPOIETIN: 13.6 m[IU]/mL (ref 2.6–18.5)

## 2015-11-11 LAB — PROTEIN ELECTROPHORESIS, SERUM
A/G RATIO SPE: 1 (ref 0.7–1.7)
ALBUMIN ELP: 3 g/dL (ref 2.9–4.4)
ALPHA-1-GLOBULIN: 0.3 g/dL (ref 0.0–0.4)
ALPHA-2-GLOBULIN: 0.8 g/dL (ref 0.4–1.0)
BETA GLOBULIN: 1 g/dL (ref 0.7–1.3)
GLOBULIN, TOTAL: 3 g/dL (ref 2.2–3.9)
Gamma Globulin: 0.8 g/dL (ref 0.4–1.8)
Total Protein ELP: 6 g/dL (ref 6.0–8.5)

## 2015-11-11 LAB — IGG, IGA, IGM
IgA: 172 mg/dL (ref 87–352)
IgG (Immunoglobin G), Serum: 723 mg/dL (ref 700–1600)
IgM, Serum: 84 mg/dL (ref 26–217)

## 2015-11-11 LAB — KAPPA/LAMBDA LIGHT CHAINS
KAPPA, LAMDA LIGHT CHAIN RATIO: 1.37 (ref 0.26–1.65)
Kappa free light chain: 52.5 mg/L — ABNORMAL HIGH (ref 3.3–19.4)
LAMDA FREE LIGHT CHAINS: 38.2 mg/L — AB (ref 5.7–26.3)

## 2015-11-11 LAB — PATHOLOGIST SMEAR REVIEW

## 2015-11-11 LAB — BETA 2 MICROGLOBULIN, SERUM: Beta-2 Microglobulin: 7 mg/L — ABNORMAL HIGH (ref 0.6–2.4)

## 2015-11-14 LAB — IMMUNOFIXATION ELECTROPHORESIS
IGM, SERUM: 85 mg/dL (ref 26–217)
IgA: 175 mg/dL (ref 87–352)
IgG (Immunoglobin G), Serum: 726 mg/dL (ref 700–1600)
TOTAL PROTEIN ELP: 6 g/dL (ref 6.0–8.5)

## 2015-11-24 ENCOUNTER — Ambulatory Visit (HOSPITAL_COMMUNITY): Payer: Medicare Other

## 2015-11-24 ENCOUNTER — Encounter (HOSPITAL_COMMUNITY): Payer: Medicare Other | Attending: Oncology

## 2015-11-24 ENCOUNTER — Encounter (HOSPITAL_BASED_OUTPATIENT_CLINIC_OR_DEPARTMENT_OTHER): Payer: Medicare Other

## 2015-11-24 VITALS — BP 159/70 | HR 69 | Resp 16

## 2015-11-24 DIAGNOSIS — N189 Chronic kidney disease, unspecified: Secondary | ICD-10-CM

## 2015-11-24 DIAGNOSIS — D649 Anemia, unspecified: Secondary | ICD-10-CM | POA: Diagnosis present

## 2015-11-24 DIAGNOSIS — D631 Anemia in chronic kidney disease: Secondary | ICD-10-CM | POA: Diagnosis not present

## 2015-11-24 DIAGNOSIS — K5520 Angiodysplasia of colon without hemorrhage: Secondary | ICD-10-CM

## 2015-11-24 DIAGNOSIS — I8500 Esophageal varices without bleeding: Secondary | ICD-10-CM

## 2015-11-24 LAB — CBC
HEMATOCRIT: 28.2 % — AB (ref 36.0–46.0)
HEMOGLOBIN: 9 g/dL — AB (ref 12.0–15.0)
MCH: 30.3 pg (ref 26.0–34.0)
MCHC: 31.9 g/dL (ref 30.0–36.0)
MCV: 94.9 fL (ref 78.0–100.0)
Platelets: 215 10*3/uL (ref 150–400)
RBC: 2.97 MIL/uL — ABNORMAL LOW (ref 3.87–5.11)
RDW: 15.1 % (ref 11.5–15.5)
WBC: 7.3 10*3/uL (ref 4.0–10.5)

## 2015-11-24 LAB — BASIC METABOLIC PANEL
ANION GAP: 4 — AB (ref 5–15)
BUN: 38 mg/dL — ABNORMAL HIGH (ref 6–20)
CALCIUM: 8.1 mg/dL — AB (ref 8.9–10.3)
CO2: 22 mmol/L (ref 22–32)
CREATININE: 3.56 mg/dL — AB (ref 0.44–1.00)
Chloride: 111 mmol/L (ref 101–111)
GFR calc non Af Amer: 13 mL/min — ABNORMAL LOW (ref >60)
GFR, EST AFRICAN AMERICAN: 15 mL/min — AB (ref >60)
Glucose, Bld: 92 mg/dL (ref 65–99)
Potassium: 4.6 mmol/L (ref 3.5–5.1)
SODIUM: 137 mmol/L (ref 135–145)

## 2015-11-24 LAB — FERRITIN: FERRITIN: 247 ng/mL (ref 11–307)

## 2015-11-24 MED ORDER — DARBEPOETIN ALFA 100 MCG/0.5ML IJ SOSY
80.0000 ug | PREFILLED_SYRINGE | Freq: Once | INTRAMUSCULAR | Status: AC
  Administered 2015-11-24: 80 ug via SUBCUTANEOUS

## 2015-11-24 MED ORDER — DARBEPOETIN ALFA 100 MCG/0.5ML IJ SOSY
PREFILLED_SYRINGE | INTRAMUSCULAR | Status: AC
  Filled 2015-11-24: qty 0.5

## 2015-11-24 NOTE — Progress Notes (Signed)
Shannon [name] presents today for injection per MD orders. Aranesp 80mcg administered SQ in right Abdomen. Administration without incident. Patient tolerated well.  

## 2015-11-24 NOTE — Patient Instructions (Signed)
[place] at [facility] Discharge Instructions  RECOMMENDATIONS MADE BY THE CONSULTANT AND ANY TEST RESULTS WILL BE SENT TO YOUR REFERRING PHYSICIAN.  B12 injections done today. Follow up as scheduled.  Call for any concerns or questions  Thank you for choosing [place] at [facility] to provide your oncology and hematology care.  To afford each patient quality time with our provider, please arrive at least 15 minutes before your scheduled appointment time.   Beginning January 23rd 2017 lab work for the [facility] will be done in the  Main lab at [facility] on 1st floor. If you have a lab appointment with the [facility] please come in thru the  Main Entrance and check in at the main information desk  You need to re-schedule your appointment should you arrive 10 or more minutes late.  We strive to give you quality time with our providers, and arriving late affects you and other patients whose appointments are after yours.  Also, if you no show three or more times for appointments you may be dismissed from the clinic at the providers discretion.     Again, thank you for choosing [facility].  Our hope is that these requests will decrease the amount of time that you wait before being seen by our physicians.       _____________________________________________________________  Should you have questions after your visit to [facility], please contact our office at [phone] between the hours of 8:30 a.m. and 4:30 p.m.  Voicemails left after 4:30 p.m. will not be returned until the following business day.  For prescription refill requests, have your pharmacy contact our office.         Resources For Cancer Patients and their Caregivers ? American Cancer Society: Can assist with transportation, wigs, general needs, runs Look Good Feel Better.        ([phone] ? Cancer Care: Provides financial  assistance, online support groups, medication/co-pay assistance.  1-800-813-HOPE 2627414804) ? [place] Assists [place] cancer patients and their families through emotional , educational and financial support.  712-416-1470 ? [place] DSS Where to apply for food stamps, Medicaid and utility assistance. [phone] ? RCATS: Transportation to medical appointments. 317-073-2509 ? Social Security Administration: May apply for disability if have a Stage IV cancer. 531-600-1381 240-404-8523 ? [employer], Disability and Transit Services: Assists with nutrition, care and transit needs. [place] Support Programs: @10RELATIVEDAYS @ > Cancer Support Group  2nd Tuesday of the month 1pm-2pm, Journey Room  > Creative Journey  3rd Tuesday of the month 1130am-1pm, Journey Room  > Look Good Feel Better  1st Wednesday of the month 10am-12 noon, Journey Room (Call [place] to register [phone])

## 2015-12-08 ENCOUNTER — Ambulatory Visit (HOSPITAL_COMMUNITY): Payer: Medicare Other

## 2015-12-08 ENCOUNTER — Encounter (HOSPITAL_BASED_OUTPATIENT_CLINIC_OR_DEPARTMENT_OTHER): Payer: Medicare Other

## 2015-12-08 ENCOUNTER — Encounter (HOSPITAL_COMMUNITY): Payer: Medicare Other

## 2015-12-08 VITALS — BP 135/66 | HR 63 | Temp 98.4°F | Resp 20

## 2015-12-08 DIAGNOSIS — N189 Chronic kidney disease, unspecified: Secondary | ICD-10-CM

## 2015-12-08 DIAGNOSIS — D631 Anemia in chronic kidney disease: Secondary | ICD-10-CM

## 2015-12-08 DIAGNOSIS — D649 Anemia, unspecified: Secondary | ICD-10-CM | POA: Diagnosis not present

## 2015-12-08 DIAGNOSIS — I8500 Esophageal varices without bleeding: Secondary | ICD-10-CM

## 2015-12-08 DIAGNOSIS — K5520 Angiodysplasia of colon without hemorrhage: Secondary | ICD-10-CM

## 2015-12-08 LAB — CBC
HCT: 30.4 % — ABNORMAL LOW (ref 36.0–46.0)
Hemoglobin: 9.7 g/dL — ABNORMAL LOW (ref 12.0–15.0)
MCH: 31 pg (ref 26.0–34.0)
MCHC: 31.9 g/dL (ref 30.0–36.0)
MCV: 97.1 fL (ref 78.0–100.0)
PLATELETS: 234 10*3/uL (ref 150–400)
RBC: 3.13 MIL/uL — AB (ref 3.87–5.11)
RDW: 14.4 % (ref 11.5–15.5)
WBC: 7.1 10*3/uL (ref 4.0–10.5)

## 2015-12-08 LAB — BASIC METABOLIC PANEL
ANION GAP: 5 (ref 5–15)
BUN: 43 mg/dL — AB (ref 6–20)
CO2: 23 mmol/L (ref 22–32)
Calcium: 8.7 mg/dL — ABNORMAL LOW (ref 8.9–10.3)
Chloride: 113 mmol/L — ABNORMAL HIGH (ref 101–111)
Creatinine, Ser: 3.46 mg/dL — ABNORMAL HIGH (ref 0.44–1.00)
GFR, EST AFRICAN AMERICAN: 15 mL/min — AB (ref >60)
GFR, EST NON AFRICAN AMERICAN: 13 mL/min — AB (ref >60)
Glucose, Bld: 104 mg/dL — ABNORMAL HIGH (ref 65–99)
POTASSIUM: 4.7 mmol/L (ref 3.5–5.1)
SODIUM: 141 mmol/L (ref 135–145)

## 2015-12-08 LAB — FERRITIN: FERRITIN: 309 ng/mL — AB (ref 11–307)

## 2015-12-08 MED ORDER — DARBEPOETIN ALFA 100 MCG/0.5ML IJ SOSY
80.0000 ug | PREFILLED_SYRINGE | Freq: Once | INTRAMUSCULAR | Status: AC
  Administered 2015-12-08: 80 ug via SUBCUTANEOUS

## 2015-12-08 MED ORDER — DARBEPOETIN ALFA 100 MCG/0.5ML IJ SOSY
PREFILLED_SYRINGE | INTRAMUSCULAR | Status: AC
  Filled 2015-12-08: qty 0.5

## 2015-12-08 NOTE — Progress Notes (Signed)
Shannon [name] presents today for injection per MD orders. Aranesp 80mcg administered SQ in right Abdomen. Administration without incident. Patient tolerated well.  

## 2015-12-11 ENCOUNTER — Encounter (HOSPITAL_COMMUNITY): Payer: Self-pay | Admitting: Oncology

## 2015-12-22 ENCOUNTER — Ambulatory Visit (HOSPITAL_COMMUNITY): Payer: Medicare Other

## 2015-12-22 ENCOUNTER — Encounter (HOSPITAL_BASED_OUTPATIENT_CLINIC_OR_DEPARTMENT_OTHER): Payer: Medicare Other

## 2015-12-22 ENCOUNTER — Encounter (HOSPITAL_COMMUNITY): Payer: Medicare Other | Attending: Hematology & Oncology

## 2015-12-22 VITALS — BP 136/59 | HR 58 | Temp 98.3°F | Resp 18

## 2015-12-22 DIAGNOSIS — Q2733 Arteriovenous malformation of digestive system vessel: Secondary | ICD-10-CM | POA: Insufficient documentation

## 2015-12-22 DIAGNOSIS — D631 Anemia in chronic kidney disease: Secondary | ICD-10-CM | POA: Diagnosis not present

## 2015-12-22 DIAGNOSIS — I8500 Esophageal varices without bleeding: Secondary | ICD-10-CM | POA: Insufficient documentation

## 2015-12-22 DIAGNOSIS — N189 Chronic kidney disease, unspecified: Secondary | ICD-10-CM

## 2015-12-22 DIAGNOSIS — K5520 Angiodysplasia of colon without hemorrhage: Secondary | ICD-10-CM

## 2015-12-22 LAB — CBC
HCT: 30.3 % — ABNORMAL LOW (ref 36.0–46.0)
HEMOGLOBIN: 9.9 g/dL — AB (ref 12.0–15.0)
MCH: 31.7 pg (ref 26.0–34.0)
MCHC: 32.7 g/dL (ref 30.0–36.0)
MCV: 97.1 fL (ref 78.0–100.0)
PLATELETS: 224 10*3/uL (ref 150–400)
RBC: 3.12 MIL/uL — AB (ref 3.87–5.11)
RDW: 14.8 % (ref 11.5–15.5)
WBC: 7.2 10*3/uL (ref 4.0–10.5)

## 2015-12-22 LAB — BASIC METABOLIC PANEL
Anion gap: 5 (ref 5–15)
BUN: 42 mg/dL — ABNORMAL HIGH (ref 6–20)
CO2: 24 mmol/L (ref 22–32)
Calcium: 8.1 mg/dL — ABNORMAL LOW (ref 8.9–10.3)
Chloride: 111 mmol/L (ref 101–111)
Creatinine, Ser: 3.98 mg/dL — ABNORMAL HIGH (ref 0.44–1.00)
GFR calc Af Amer: 13 mL/min — ABNORMAL LOW (ref >60)
GFR calc non Af Amer: 11 mL/min — ABNORMAL LOW (ref >60)
Glucose, Bld: 91 mg/dL (ref 65–99)
Potassium: 4.3 mmol/L (ref 3.5–5.1)
Sodium: 140 mmol/L (ref 135–145)

## 2015-12-22 LAB — FERRITIN: Ferritin: 338 ng/mL — ABNORMAL HIGH (ref 11–307)

## 2015-12-22 MED ORDER — DARBEPOETIN ALFA 100 MCG/0.5ML IJ SOSY
80.0000 ug | PREFILLED_SYRINGE | Freq: Once | INTRAMUSCULAR | Status: AC
  Administered 2015-12-22: 80 ug via SUBCUTANEOUS

## 2015-12-22 MED ORDER — DARBEPOETIN ALFA 100 MCG/0.5ML IJ SOSY
PREFILLED_SYRINGE | INTRAMUSCULAR | Status: AC
  Filled 2015-12-22: qty 0.5

## 2015-12-22 NOTE — Progress Notes (Signed)
Shannon [name] presents today for injection per MD orders. Aranesp 80mcg administered SQ in right Abdomen. Administration without incident. Patient tolerated well.  

## 2015-12-22 NOTE — Patient Instructions (Addendum)
[place] at [facility] Discharge Instructions  RECOMMENDATIONS MADE BY THE CONSULTANT AND ANY TEST RESULTS WILL BE SENT TO YOUR REFERRING PHYSICIAN.  Hemoglobin 9.9 today. Aranesp 80 mcg injection given as ordered. Return as scheduled.  Thank you for choosing [place] at [facility] to provide your oncology and hematology care.  To afford each patient quality time with our provider, please arrive at least 15 minutes before your scheduled appointment time.   Beginning January 23rd 2017 lab work for the [facility] will be done in the  Main lab at [facility] on 1st floor. If you have a lab appointment with the [facility] please come in thru the  Main Entrance and check in at the main information desk  You need to re-schedule your appointment should you arrive 10 or more minutes late.  We strive to give you quality time with our providers, and arriving late affects you and other patients whose appointments are after yours.  Also, if you no show three or more times for appointments you may be dismissed from the clinic at the providers discretion.     Again, thank you for choosing [facility].  Our hope is that these requests will decrease the amount of time that you wait before being seen by our physicians.       _____________________________________________________________  Should you have questions after your visit to [facility], please contact our office at [phone] between the hours of 8:30 a.m. and 4:30 p.m.  Voicemails left after 4:30 p.m. will not be returned until the following business day.  For prescription refill requests, have your pharmacy contact our office.         Resources For Cancer Patients and their Caregivers ? American Cancer Society: Can assist with transportation, wigs, general needs, runs Look Good Feel Better.        [phone] ? Cancer Care: Provides financial  assistance, online support groups, medication/co-pay assistance.  1-800-813-HOPE 225-691-5246) ? [place] Assists [place] cancer patients and their families through emotional , educational and financial support.  ([phone] ? [place] DSS Where to apply for food stamps, Medicaid and utility assistance. 714-021-5416 ? RCATS: Transportation to medical appointments. 947-688-5944 ? Social Security Administration: May apply for disability if have a Stage IV cancer. 249-156-8429 463-097-0601 ? [employer], Disability and Transit Services: Assists with nutrition, care and transit needs. [place] Support Programs: @10RELATIVEDAYS @ > Cancer Support Group  2nd Tuesday of the month 1pm-2pm, Journey Room  > Creative Journey  3rd Tuesday of the month 1130am-1pm, Journey Room  > Look Good Feel Better  1st Wednesday of the month 10am-12 noon, Journey Room (Call [place] to register [phone])

## 2016-01-04 NOTE — Progress Notes (Signed)
[facility] Hematology/Oncology Consultation   Name: Shannon [name]      MRN: [id number]     Date: 01/07/2016 Time:6:53 PM   REFERRING PHYSICIAN:  Audree Camel, MD (Medical Oncology)  REASON FOR CONSULT:  Transfer of hematologic care   DIAGNOSIS:  Anemia of chronic renal disease, Stage IV  HISTORY OF PRESENT ILLNESS:   Shannon [name] is a 60 y.o. female with a medical history significant for CHF, MI, HTN, asthma, obesity, AVMs and esophageal varices who is referred to the [facility] for anemia of chronic renal disease, she was previously on  Aranesp every 4 weeks at a dose of 500 g. Currenly now on renal dosing at 80 ug every 2 weeks. Counts are excellent.   The patient's nephrologist is Fran Lowes, MD (Nephrology).  She note that her renal function has been stable x 3 years.  She has not needed dialysis.  She denies any complaints today. She denies BRBPR, no black or tarry stool.   She notes that she has recently seen Dr. Hinda Lenis. He does not want her hgb to be greater than 10. She does not want to receive aranesp today.   Review of Systems  Constitutional: Negative for chills, fever, malaise/fatigue and weight loss.  HENT: Negative for nosebleeds and sore throat.   Eyes: Negative for blurred vision, double vision and photophobia.  Respiratory: Negative for cough, hemoptysis, sputum production, shortness of breath and wheezing.   Cardiovascular: Negative for chest pain, palpitations, claudication and leg swelling.  Gastrointestinal: Negative for abdominal pain, blood in stool, constipation, diarrhea, melena, nausea and vomiting.  Genitourinary: Negative for dysuria, frequency, hematuria and urgency.  Musculoskeletal: Negative for falls.  Skin: Negative for itching and rash.  Neurological: Negative for dizziness, sensory change, speech change, focal weakness, seizures, loss of consciousness, weakness and headaches.    Endo/Heme/Allergies: Does not bruise/bleed easily.  Psychiatric/Behavioral: Negative.   14 point review of systems was performed and is negative except as detailed under history of present illness and above   PAST MEDICAL HISTORY:   Past Medical History:  Diagnosis Date  . Absolute anemia 11/10/2015  . Anemia   . Anemia of renal disease 11/10/2015  . Asthma    has Albuterol inhaler prn  . CHF (congestive heart failure) ([place])    last echo showed EF 20%  . Chronic kidney disease    Membranous nephropathy ( Seen at [facility])  . Gastric ulcer   . History of blood transfusion    no abnormal reaction noted  . History of bronchitis    last time 3-59yr ago  . Hyperlipidemia    takes Pravastatin daily  . Hypertension    takes Imdur and Hydralazine daily as well as Coreg  . Hypothyroidism    takes Synthroid daily  . Itching    on legs-has a Kenalog cream  . Metabolic bone disease   . Nausea    takes Zofran prn  . Obesity   . Peripheral edema    takes Torsemide daily  . Thyroid disease    hypothyroidism    ALLERGIES: Allergies  Allergen Reactions  . Corticosteroids Hives, Itching and Nausea And Vomiting  . Rituximab     wheezing      MEDICATIONS: I have reviewed the patient's current medications.    Current Outpatient Prescriptions on File Prior to Visit  Medication Sig Dispense Refill  . albuterol (PROVENTIL HFA;VENTOLIN HFA) 108 (90 BASE) MCG/ACT inhaler Inhale  into the lungs every 6 (six) hours as needed for wheezing.    . carvedilol (COREG) 12.5 MG tablet Take 12.5 mg by mouth 2 (two) times daily with a meal.    . hydrALAZINE (APRESOLINE) 50 MG tablet Take 50 mg by mouth every 8 (eight) hours.    . isosorbide mononitrate (IMDUR) 30 MG 24 hr tablet Take 30 mg by mouth daily.    Marland Kitchen levothyroxine (SYNTHROID, LEVOTHROID) 25 MCG tablet Take 25 mcg by mouth daily. Reported on 10/05/2015    . lisinopril (PRINIVIL,ZESTRIL) 40 MG tablet Take 40 mg by mouth daily.    . metolazone  (ZAROXOLYN) 5 MG tablet Take 5 mg by mouth 2 (two) times daily as needed. Reported on 10/05/2015    . ondansetron (ZOFRAN) 4 MG tablet Take 4 mg by mouth every 8 (eight) hours as needed for nausea.    . potassium chloride SA (K-DUR,KLOR-CON) 20 MEQ tablet Take 20 mEq by mouth daily. Reported on 10/05/2015    . pravastatin (PRAVACHOL) 80 MG tablet Take 80 mg by mouth daily.    . raloxifene (EVISTA) 60 MG tablet Take 60 mg by mouth daily.    . sodium bicarbonate 650 MG tablet Take 650 mg by mouth 3 (three) times daily.    Marland Kitchen torsemide (DEMADEX) 20 MG tablet Take 20 mg by mouth daily.    Marland Kitchen triamcinolone cream (KENALOG) 0.1 % Apply 1 application topically 2 (two) times daily as needed (to skin).     . Vitamin D, Ergocalciferol, (DRISDOL) 50000 UNITS CAPS Take 50,000 Units by mouth every 30 (thirty) days. On _0 +yrs ago    FAMILY HISTORY: Family History  Problem Relation Age of Onset  . Kidney disease Other   . Diabetes Mother     SOCIAL HISTORY:  reports that she has been smoking Cigarettes.  She started smoking about 47 years ago. She has a 40.00 pack-year smoking history. She has never used smokeless tobacco. She reports that she does not drink alcohol or use drugs.  Social History   Social History  . Marital status: Single    Spouse name: N/A  . Number of children: N/A  . Years of education: N/A   Social History Main Topics  . Smoking status: Current Every Day Smoker    Packs/day: 1.00    Years: 40.00    Types: Cigarettes    Start date: 05/14/1968    . Smokeless tobacco: Never Used  . Alcohol use No     Comment: nothing in 2-56yr   . Drug use: No  . Sexual activity: Not Currently   Other Topics Concern  . None   Social History Narrative  . None    PERFORMANCE STATUS: The patient's performance status is 1 - Symptomatic but completely ambulatory   PHYSICAL EXAM: Most Recent Vital Signs: Blood pressure (!) 185/80, pulse 66, temperature 98.1 F (36.7 C), temperature source Oral, resp. rate 18, weight 227 lb (103 kg), SpO2 97 %. General appearance: alert, appears stated  age, no distress, moderately obese and pleasant and unaccompanied Head: Normocephalic, without obvious abnormality, atraumatic Eyes: negative findings: lids and lashes normal, conjunctivae and sclerae normal and corneas clear Throat: normal findings: oropharynx pink & moist without lesions or evidence of thrush Neck: no adenopathy and supple, symmetrical, trachea midline Lungs: clear to auscultation bilaterally and normal percussion bilaterally Heart: regular rate and rhythm, S1, S2 normal, no murmur, click, rub or gallop Extremities: extremities normal, atraumatic, no cyanosis or edema Skin: Skin color, texture, turgor normal. No rashes or lesions Lymph nodes: Cervical, supraclavicular, and axillary nodes normal. Neurologic: Alert and oriented X 3, normal strength and tone. Normal symmetric reflexes. Normal coordination and gait  LABORATORY DATA:  I have personally reviewed the laboratory studied detailed below:  Results for MAXIMA, SKELTON (MRN [id number])   Ref. Range 01/05/2016 09:52  Sodium Latest Ref Range: 135 - 145 mmol/L 136  Potassium Latest Ref Range: 3.5 - 5.1 mmol/L 4.8  Chloride Latest Ref Range: 101 - 111 mmol/L 110  CO2 Latest Ref Range: 22 - 32 mmol/L 20 (L)  BUN Latest Ref Range: 6 - 20 mg/dL 44 (H)  Creatinine Latest Ref Range: 0.44 - 1.00 mg/dL 3.89 (H)  Calcium Latest Ref Range: 8.9 - 10.3 mg/dL 8.6 (L)  EGFR (Non-African Amer.)  Latest Ref Range: >60 mL/min 12 (L)  EGFR (African American) Latest Ref Range: >60 mL/min 13 (L)  Glucose Latest Ref Range: 65 - 99 mg/dL 108 (H)  Anion gap Latest Ref Range: 5 - 15  6  Ferritin Latest Ref Range: 11 - 307 ng/mL 331 (H)  WBC Latest Ref Range: 4.0 - 10.5 K/uL 14.0 (H)  RBC Latest Ref Range: 3.87 - 5.11 MIL/uL 3.46 (L)  Hemoglobin Latest Ref Range: 12.0 - 15.0 g/dL 10.8 (L)  HCT Latest Ref Range: 36.0 - 46.0 % 33.4 (L)  MCV Latest Ref Range: 78.0 - 100.0 fL 96.5  MCH Latest Ref Range: 26.0 - 34.0 pg 31.2  MCHC Latest Ref Range: 30.0 - 36.0 g/dL 32.3  RDW Latest Ref Range: 11.5 - 15.5 % 14.3  Platelets Latest Ref Range: 150 - 400 K/uL 246   RADIOGRAPHY: No results found.     PATHOLOGY:  N/A  ASSESSMENT/PLAN:  Absolute anemia Anemia secondary to stage IV CKD History of AVMs History of esophageal varices  Normocytic, normochromic anemia having been on Aranesp every 4 weeks by Dr. Jacquiline Doe at [facility] in [place], [place] secondary to chronic renal disease, Stage IV.  Initial dosing was 500 ug q 4 weeks. We have changed her dosing to 80 ug every 2 weeks. She is doing well. She does not wish to receive aranesp today. She notes she was advised not to let her hgb get over 10 gm/dl.   She will return every 2 weeks for labs and ESA treatment if parameters are met. Advised her that if she continues to not need her injections every 2 weeks we can move her scheduling. Ferritin levels will be followed accordingly every 90 days. Goal is to keep ferritin greater than or equal to 100 ng/ml.   Return in 12 weeks for follow-up.  All questions were answered. The patient knows to call the clinic with any problems, questions or concerns. We can certainly see the patient much sooner if necessary.  This document serves as a record of services personally performed by Ancil Linsey, MD. It was created on her behalf by Arlyce Harman, a trained medical scribe. The creation of  this record  is based on the scribe's personal observations and the provider's statements to them. This document has been checked and approved by the attending provider.  I have reviewed the above documentation for accuracy and completeness and I agree with the above.  This note is electronically signed [email or web address] Cyril Mourning, MD  01/07/2016 6:53 PM

## 2016-01-05 ENCOUNTER — Ambulatory Visit (HOSPITAL_COMMUNITY): Payer: Medicare Other

## 2016-01-05 ENCOUNTER — Encounter (HOSPITAL_COMMUNITY): Payer: Self-pay | Admitting: Hematology & Oncology

## 2016-01-05 ENCOUNTER — Encounter (HOSPITAL_COMMUNITY): Payer: Medicare Other

## 2016-01-05 ENCOUNTER — Telehealth (HOSPITAL_COMMUNITY): Payer: Self-pay | Admitting: *Deleted

## 2016-01-05 ENCOUNTER — Encounter (HOSPITAL_BASED_OUTPATIENT_CLINIC_OR_DEPARTMENT_OTHER): Payer: Medicare Other | Admitting: Hematology & Oncology

## 2016-01-05 VITALS — BP 185/80 | HR 66 | Temp 98.1°F | Resp 18 | Wt 227.0 lb

## 2016-01-05 DIAGNOSIS — N189 Chronic kidney disease, unspecified: Secondary | ICD-10-CM

## 2016-01-05 DIAGNOSIS — D631 Anemia in chronic kidney disease: Secondary | ICD-10-CM

## 2016-01-05 DIAGNOSIS — N184 Chronic kidney disease, stage 4 (severe): Secondary | ICD-10-CM | POA: Diagnosis not present

## 2016-01-05 DIAGNOSIS — I8500 Esophageal varices without bleeding: Secondary | ICD-10-CM

## 2016-01-05 DIAGNOSIS — K5520 Angiodysplasia of colon without hemorrhage: Secondary | ICD-10-CM

## 2016-01-05 LAB — CBC
HCT: 33.4 % — ABNORMAL LOW (ref 36.0–46.0)
Hemoglobin: 10.8 g/dL — ABNORMAL LOW (ref 12.0–15.0)
MCH: 31.2 pg (ref 26.0–34.0)
MCHC: 32.3 g/dL (ref 30.0–36.0)
MCV: 96.5 fL (ref 78.0–100.0)
PLATELETS: 246 10*3/uL (ref 150–400)
RBC: 3.46 MIL/uL — AB (ref 3.87–5.11)
RDW: 14.3 % (ref 11.5–15.5)
WBC: 14 10*3/uL — AB (ref 4.0–10.5)

## 2016-01-05 LAB — BASIC METABOLIC PANEL
Anion gap: 6 (ref 5–15)
BUN: 44 mg/dL — AB (ref 6–20)
CO2: 20 mmol/L — ABNORMAL LOW (ref 22–32)
Calcium: 8.6 mg/dL — ABNORMAL LOW (ref 8.9–10.3)
Chloride: 110 mmol/L (ref 101–111)
Creatinine, Ser: 3.89 mg/dL — ABNORMAL HIGH (ref 0.44–1.00)
GFR, EST AFRICAN AMERICAN: 13 mL/min — AB (ref >60)
GFR, EST NON AFRICAN AMERICAN: 12 mL/min — AB (ref >60)
Glucose, Bld: 108 mg/dL — ABNORMAL HIGH (ref 65–99)
POTASSIUM: 4.8 mmol/L (ref 3.5–5.1)
SODIUM: 136 mmol/L (ref 135–145)

## 2016-01-05 LAB — FERRITIN: FERRITIN: 331 ng/mL — AB (ref 11–307)

## 2016-01-05 MED ORDER — DARBEPOETIN ALFA 100 MCG/0.5ML IJ SOSY
80.0000 ug | PREFILLED_SYRINGE | Freq: Once | INTRAMUSCULAR | Status: DC
  Filled 2016-01-05: qty 0.5

## 2016-01-05 NOTE — Progress Notes (Signed)
No shot today per patient. Patient stated that Dr. Lynnette Caffey said if her hemoglobin was a 10.0 then she did not need the shot. So, no shot today per patient. Dr. Whitney Muse notified.

## 2016-01-05 NOTE — Telephone Encounter (Signed)
-----   Message from Baird Cancer, PA-C sent at 01/05/2016  4:01 PM EDT ----- Stable

## 2016-01-05 NOTE — Patient Instructions (Signed)
[place] at [facility] Discharge Instructions  RECOMMENDATIONS MADE BY THE CONSULTANT AND ANY TEST RESULTS WILL BE SENT TO YOUR REFERRING PHYSICIAN.  You saw Dr. Whitney Muse today. Follow up with tom in 3 months. Continue aranesp every 2 weeks with labs.  Thank you for choosing [place] at [facility] to provide your oncology and hematology care.  To afford each patient quality time with our provider, please arrive at least 15 minutes before your scheduled appointment time.   Beginning January 23rd 2017 lab work for the [facility] will be done in the  Main lab at [facility] on 1st floor. If you have a lab appointment with the [facility] please come in thru the  Main Entrance and check in at the main information desk  You need to re-schedule your appointment should you arrive 10 or more minutes late.  We strive to give you quality time with our providers, and arriving late affects you and other patients whose appointments are after yours.  Also, if you no show three or more times for appointments you may be dismissed from the clinic at the providers discretion.     Again, thank you for choosing [facility].  Our hope is that these requests will decrease the amount of time that you wait before being seen by our physicians.       _____________________________________________________________  Should you have questions after your visit to [facility], please contact our office at [phone] between the hours of 8:30 a.m. and 4:30 p.m.  Voicemails left after 4:30 p.m. will not be returned until the following business day.  For prescription refill requests, have your pharmacy contact our office.         Resources For Cancer Patients and their Caregivers ? American Cancer Society: Can assist with transportation, wigs, general needs, runs Look Good Feel Better.        9345952940 ? Cancer Care: Provides  financial assistance, online support groups, medication/co-pay assistance.  1-800-813-HOPE 7078230547) ? [place] Assists [place] cancer patients and their families through emotional , educational and financial support.  [phone] ? [place] DSS Where to apply for food stamps, Medicaid and utility assistance. ([phone] ? RCATS: Transportation to medical appointments. ([phone] ? Social Security Administration: May apply for disability if have a Stage IV cancer. 304-396-0530 301-541-9847 ? [employer], Disability and Transit Services: Assists with nutrition, care and transit needs. [place] Support Programs: @10RELATIVEDAYS @ > Cancer Support Group  2nd Tuesday of the month 1pm-2pm, Journey Room  > Creative Journey  3rd Tuesday of the month 1130am-1pm, Journey Room  > Look Good Feel Better  1st Wednesday of the month 10am-12 noon, Journey Room (Call [place] to register [phone])

## 2016-01-05 NOTE — Telephone Encounter (Signed)
Pt aware labs are stable.  

## 2016-01-07 ENCOUNTER — Encounter (HOSPITAL_COMMUNITY): Payer: Self-pay | Admitting: Hematology & Oncology

## 2016-01-20 ENCOUNTER — Encounter (HOSPITAL_COMMUNITY): Payer: Medicare Other | Attending: Hematology & Oncology

## 2016-01-20 DIAGNOSIS — D631 Anemia in chronic kidney disease: Secondary | ICD-10-CM | POA: Insufficient documentation

## 2016-01-20 DIAGNOSIS — N189 Chronic kidney disease, unspecified: Secondary | ICD-10-CM

## 2016-01-20 LAB — CBC
HEMATOCRIT: 31.7 % — AB (ref 36.0–46.0)
Hemoglobin: 10.1 g/dL — ABNORMAL LOW (ref 12.0–15.0)
MCH: 31.1 pg (ref 26.0–34.0)
MCHC: 31.9 g/dL (ref 30.0–36.0)
MCV: 97.5 fL (ref 78.0–100.0)
PLATELETS: 226 10*3/uL (ref 150–400)
RBC: 3.25 MIL/uL — ABNORMAL LOW (ref 3.87–5.11)
RDW: 13.6 % (ref 11.5–15.5)
WBC: 7.8 10*3/uL (ref 4.0–10.5)

## 2016-01-20 MED ORDER — DARBEPOETIN ALFA 100 MCG/0.5ML IJ SOSY
80.0000 ug | PREFILLED_SYRINGE | Freq: Once | INTRAMUSCULAR | Status: DC
  Filled 2016-01-20: qty 0.5

## 2016-01-20 NOTE — Progress Notes (Signed)
Patients hemoblobin is 10.1. Patients states that Dr. Lynnette Caffey states if she is 10.0 or better that she does not get her aranesp. No aranesp given. Discussed with Dr. Whitney Muse about this at previous office visit.

## 2016-02-02 ENCOUNTER — Encounter (HOSPITAL_BASED_OUTPATIENT_CLINIC_OR_DEPARTMENT_OTHER): Payer: Medicare Other

## 2016-02-02 ENCOUNTER — Encounter (HOSPITAL_COMMUNITY): Payer: Self-pay

## 2016-02-02 ENCOUNTER — Encounter (HOSPITAL_COMMUNITY): Payer: Medicare Other

## 2016-02-02 VITALS — BP 141/65 | HR 65 | Temp 98.3°F | Resp 16

## 2016-02-02 DIAGNOSIS — D631 Anemia in chronic kidney disease: Secondary | ICD-10-CM | POA: Diagnosis not present

## 2016-02-02 DIAGNOSIS — N189 Chronic kidney disease, unspecified: Secondary | ICD-10-CM

## 2016-02-02 DIAGNOSIS — N184 Chronic kidney disease, stage 4 (severe): Secondary | ICD-10-CM

## 2016-02-02 LAB — CBC
HEMATOCRIT: 30.7 % — AB (ref 36.0–46.0)
Hemoglobin: 9.7 g/dL — ABNORMAL LOW (ref 12.0–15.0)
MCH: 30.6 pg (ref 26.0–34.0)
MCHC: 31.6 g/dL (ref 30.0–36.0)
MCV: 96.8 fL (ref 78.0–100.0)
Platelets: 217 10*3/uL (ref 150–400)
RBC: 3.17 MIL/uL — ABNORMAL LOW (ref 3.87–5.11)
RDW: 13.5 % (ref 11.5–15.5)
WBC: 7.8 10*3/uL (ref 4.0–10.5)

## 2016-02-02 MED ORDER — DARBEPOETIN ALFA 100 MCG/0.5ML IJ SOSY
80.0000 ug | PREFILLED_SYRINGE | Freq: Once | INTRAMUSCULAR | Status: AC
  Administered 2016-02-02: 80 ug via SUBCUTANEOUS
  Filled 2016-02-02: qty 0.5

## 2016-02-02 NOTE — Progress Notes (Signed)
Pt here today for Aranesp injection. Pt given injection in right lower abdomen. Pt tolerated well. Pt stable and discharged home ambulatory. Pt to return as scheduled.

## 2016-02-02 NOTE — Patient Instructions (Signed)
[place] at [facility] Discharge Instructions  RECOMMENDATIONS MADE BY THE CONSULTANT AND ANY TEST RESULTS WILL BE SENT TO YOUR REFERRING PHYSICIAN.  You were given an Aranesp injection today.  Return as scheduled.   Thank you for choosing [place] at [facility] to provide your oncology and hematology care.  To afford each patient quality time with our provider, please arrive at least 15 minutes before your scheduled appointment time.   Beginning January 23rd 2017 lab work for the [facility] will be done in the  Main lab at [facility] on 1st floor. If you have a lab appointment with the [facility] please come in thru the  Main Entrance and check in at the main information desk  You need to re-schedule your appointment should you arrive 10 or more minutes late.  We strive to give you quality time with our providers, and arriving late affects you and other patients whose appointments are after yours.  Also, if you no show three or more times for appointments you may be dismissed from the clinic at the providers discretion.     Again, thank you for choosing [facility].  Our hope is that these requests will decrease the amount of time that you wait before being seen by our physicians.       _____________________________________________________________  Should you have questions after your visit to [facility], please contact our office at [phone] between the hours of 8:30 a.m. and 4:30 p.m.  Voicemails left after 4:30 p.m. will not be returned until the following business day.  For prescription refill requests, have your pharmacy contact our office.         Resources For Cancer Patients and their Caregivers ? American Cancer Society: Can assist with transportation, wigs, general needs, runs Look Good Feel Better.        249-259-5855 ? Cancer Care: Provides financial assistance, online support  groups, medication/co-pay assistance.  1-800-813-HOPE (563) 617-0025) ? [place] Assists [place] cancer patients and their families through emotional , educational and financial support.  947-576-7962 ? [place] DSS Where to apply for food stamps, Medicaid and utility assistance. 7324236350 ? RCATS: Transportation to medical appointments. 848-712-2472 ? Social Security Administration: May apply for disability if have a Stage IV cancer. ([phone]) [phone] ? [employer], Disability and Transit Services: Assists with nutrition, care and transit needs. [place] Support Programs: @10RELATIVEDAYS @ > Cancer Support Group  2nd Tuesday of the month 1pm-2pm, Journey Room  > Creative Journey  3rd Tuesday of the month 1130am-1pm, Journey Room  > Look Good Feel Better  1st Wednesday of the month 10am-12 noon, Journey Room (Call [place] to register [phone])

## 2016-02-03 ENCOUNTER — Telehealth (HOSPITAL_COMMUNITY): Payer: Self-pay | Admitting: *Deleted

## 2016-02-03 NOTE — Telephone Encounter (Signed)
Pt aware of lab results 

## 2016-02-03 NOTE — Telephone Encounter (Signed)
-----   Message from Baird Cancer, PA-C sent at 02/02/2016  5:08 PM EDT ----- Stable.  If lower with next injection, will increase Aranesp dose.

## 2016-02-15 ENCOUNTER — Other Ambulatory Visit (HOSPITAL_COMMUNITY): Payer: Self-pay

## 2016-02-15 DIAGNOSIS — N189 Chronic kidney disease, unspecified: Secondary | ICD-10-CM

## 2016-02-15 DIAGNOSIS — D649 Anemia, unspecified: Secondary | ICD-10-CM

## 2016-02-15 DIAGNOSIS — D631 Anemia in chronic kidney disease: Secondary | ICD-10-CM

## 2016-02-16 ENCOUNTER — Encounter (HOSPITAL_COMMUNITY): Payer: Medicare Other

## 2016-02-16 ENCOUNTER — Encounter (HOSPITAL_COMMUNITY): Payer: Medicare Other | Attending: Hematology & Oncology

## 2016-02-16 DIAGNOSIS — N189 Chronic kidney disease, unspecified: Secondary | ICD-10-CM

## 2016-02-16 DIAGNOSIS — D631 Anemia in chronic kidney disease: Secondary | ICD-10-CM | POA: Diagnosis present

## 2016-02-16 DIAGNOSIS — D649 Anemia, unspecified: Secondary | ICD-10-CM | POA: Diagnosis present

## 2016-02-16 LAB — FERRITIN: FERRITIN: 306 ng/mL (ref 11–307)

## 2016-02-16 LAB — COMPREHENSIVE METABOLIC PANEL
ALK PHOS: 68 U/L (ref 38–126)
ALT: 10 U/L — AB (ref 14–54)
AST: 16 U/L (ref 15–41)
Albumin: 3.5 g/dL (ref 3.5–5.0)
Anion gap: 6 (ref 5–15)
BILIRUBIN TOTAL: 0.3 mg/dL (ref 0.3–1.2)
BUN: 45 mg/dL — AB (ref 6–20)
CALCIUM: 8.8 mg/dL — AB (ref 8.9–10.3)
CHLORIDE: 113 mmol/L — AB (ref 101–111)
CO2: 21 mmol/L — ABNORMAL LOW (ref 22–32)
CREATININE: 3.8 mg/dL — AB (ref 0.44–1.00)
GFR, EST AFRICAN AMERICAN: 14 mL/min — AB (ref >60)
GFR, EST NON AFRICAN AMERICAN: 12 mL/min — AB (ref >60)
Glucose, Bld: 144 mg/dL — ABNORMAL HIGH (ref 65–99)
Potassium: 3.9 mmol/L (ref 3.5–5.1)
SODIUM: 140 mmol/L (ref 135–145)
TOTAL PROTEIN: 6.8 g/dL (ref 6.5–8.1)

## 2016-02-16 LAB — CBC
HCT: 32.3 % — ABNORMAL LOW (ref 36.0–46.0)
Hemoglobin: 10.1 g/dL — ABNORMAL LOW (ref 12.0–15.0)
MCH: 30.9 pg (ref 26.0–34.0)
MCHC: 31.3 g/dL (ref 30.0–36.0)
MCV: 98.8 fL (ref 78.0–100.0)
PLATELETS: 210 10*3/uL (ref 150–400)
RBC: 3.27 MIL/uL — AB (ref 3.87–5.11)
RDW: 13.3 % (ref 11.5–15.5)
WBC: 7 10*3/uL (ref 4.0–10.5)

## 2016-02-16 MED ORDER — DARBEPOETIN ALFA 100 MCG/0.5ML IJ SOSY: 80.0000 ug | PREFILLED_SYRINGE | Freq: Once | INTRAMUSCULAR | Status: DC

## 2016-02-16 NOTE — Progress Notes (Signed)
Pt reported that her nephrologist recommended she get the Aranesp if her HGB was below 10 and today her HGB is 10.1. Dr. Whitney Muse was made aware and Aranesp to be held today

## 2016-03-01 ENCOUNTER — Encounter (HOSPITAL_COMMUNITY): Payer: Medicare Other

## 2016-03-01 ENCOUNTER — Other Ambulatory Visit (HOSPITAL_COMMUNITY): Payer: Self-pay | Admitting: Oncology

## 2016-03-01 VITALS — BP 156/69 | HR 67 | Temp 98.4°F | Resp 18

## 2016-03-01 DIAGNOSIS — D649 Anemia, unspecified: Secondary | ICD-10-CM | POA: Diagnosis not present

## 2016-03-01 DIAGNOSIS — N189 Chronic kidney disease, unspecified: Secondary | ICD-10-CM

## 2016-03-01 DIAGNOSIS — D631 Anemia in chronic kidney disease: Secondary | ICD-10-CM

## 2016-03-01 LAB — COMPREHENSIVE METABOLIC PANEL
ALK PHOS: 80 U/L (ref 38–126)
ALT: 13 U/L — AB (ref 14–54)
ANION GAP: 7 (ref 5–15)
AST: 17 U/L (ref 15–41)
Albumin: 3.6 g/dL (ref 3.5–5.0)
BUN: 44 mg/dL — ABNORMAL HIGH (ref 6–20)
CALCIUM: 8.7 mg/dL — AB (ref 8.9–10.3)
CO2: 22 mmol/L (ref 22–32)
CREATININE: 3.76 mg/dL — AB (ref 0.44–1.00)
Chloride: 111 mmol/L (ref 101–111)
GFR, EST AFRICAN AMERICAN: 14 mL/min — AB (ref >60)
GFR, EST NON AFRICAN AMERICAN: 12 mL/min — AB (ref >60)
Glucose, Bld: 146 mg/dL — ABNORMAL HIGH (ref 65–99)
Potassium: 4.6 mmol/L (ref 3.5–5.1)
SODIUM: 140 mmol/L (ref 135–145)
TOTAL PROTEIN: 6.9 g/dL (ref 6.5–8.1)
Total Bilirubin: 0.4 mg/dL (ref 0.3–1.2)

## 2016-03-01 LAB — CBC
HCT: 32.8 % — ABNORMAL LOW (ref 36.0–46.0)
HEMOGLOBIN: 10.2 g/dL — AB (ref 12.0–15.0)
MCH: 30.3 pg (ref 26.0–34.0)
MCHC: 31.1 g/dL (ref 30.0–36.0)
MCV: 97.3 fL (ref 78.0–100.0)
PLATELETS: 231 10*3/uL (ref 150–400)
RBC: 3.37 MIL/uL — ABNORMAL LOW (ref 3.87–5.11)
RDW: 13.7 % (ref 11.5–15.5)
WBC: 8.7 10*3/uL (ref 4.0–10.5)

## 2016-03-01 LAB — FERRITIN: FERRITIN: 369 ng/mL — AB (ref 11–307)

## 2016-03-01 MED ORDER — DARBEPOETIN ALFA 100 MCG/0.5ML IJ SOSY
PREFILLED_SYRINGE | INTRAMUSCULAR | Status: AC
  Filled 2016-03-01: qty 0.5

## 2016-03-01 MED ORDER — DARBEPOETIN ALFA 100 MCG/0.5ML IJ SOSY: 80.0000 ug | PREFILLED_SYRINGE | Freq: Once | INTRAMUSCULAR | Status: DC

## 2016-03-01 NOTE — Progress Notes (Signed)
Patient not to get Aranesp r/t lab results.  Per Robynn Pane, PA and patient reporting Dr. Hinda Lenis instruction, she is not to get the injection with a Hgb above 10.  Message sent to [facility], PA to change this in the supportive therapy treatment parameters.

## 2016-03-15 ENCOUNTER — Encounter (HOSPITAL_COMMUNITY): Payer: Medicare Other | Attending: Hematology & Oncology

## 2016-03-15 VITALS — BP 128/58 | HR 59 | Temp 98.0°F | Resp 16

## 2016-03-15 DIAGNOSIS — Z23 Encounter for immunization: Secondary | ICD-10-CM | POA: Diagnosis not present

## 2016-03-15 DIAGNOSIS — N189 Chronic kidney disease, unspecified: Secondary | ICD-10-CM

## 2016-03-15 DIAGNOSIS — D649 Anemia, unspecified: Secondary | ICD-10-CM

## 2016-03-15 DIAGNOSIS — D631 Anemia in chronic kidney disease: Secondary | ICD-10-CM | POA: Diagnosis present

## 2016-03-15 DIAGNOSIS — Q2733 Arteriovenous malformation of digestive system vessel: Secondary | ICD-10-CM | POA: Diagnosis not present

## 2016-03-15 DIAGNOSIS — I8500 Esophageal varices without bleeding: Secondary | ICD-10-CM | POA: Diagnosis not present

## 2016-03-15 LAB — CBC
HEMATOCRIT: 31 % — AB (ref 36.0–46.0)
Hemoglobin: 10 g/dL — ABNORMAL LOW (ref 12.0–15.0)
MCH: 30.9 pg (ref 26.0–34.0)
MCHC: 32.3 g/dL (ref 30.0–36.0)
MCV: 95.7 fL (ref 78.0–100.0)
PLATELETS: 224 10*3/uL (ref 150–400)
RBC: 3.24 MIL/uL — AB (ref 3.87–5.11)
RDW: 13.9 % (ref 11.5–15.5)
WBC: 8.3 10*3/uL (ref 4.0–10.5)

## 2016-03-15 LAB — COMPREHENSIVE METABOLIC PANEL
ALT: 9 U/L — AB (ref 14–54)
AST: 13 U/L — AB (ref 15–41)
Albumin: 3.4 g/dL — ABNORMAL LOW (ref 3.5–5.0)
Alkaline Phosphatase: 71 U/L (ref 38–126)
Anion gap: 6 (ref 5–15)
BILIRUBIN TOTAL: 0.4 mg/dL (ref 0.3–1.2)
BUN: 44 mg/dL — AB (ref 6–20)
CHLORIDE: 113 mmol/L — AB (ref 101–111)
CO2: 21 mmol/L — ABNORMAL LOW (ref 22–32)
CREATININE: 4.05 mg/dL — AB (ref 0.44–1.00)
Calcium: 8.6 mg/dL — ABNORMAL LOW (ref 8.9–10.3)
GFR calc Af Amer: 13 mL/min — ABNORMAL LOW (ref >60)
GFR, EST NON AFRICAN AMERICAN: 11 mL/min — AB (ref >60)
Glucose, Bld: 103 mg/dL — ABNORMAL HIGH (ref 65–99)
Potassium: 4.6 mmol/L (ref 3.5–5.1)
Sodium: 140 mmol/L (ref 135–145)
Total Protein: 6.8 g/dL (ref 6.5–8.1)

## 2016-03-15 LAB — FERRITIN: FERRITIN: 336 ng/mL — AB (ref 11–307)

## 2016-03-15 MED ORDER — INFLUENZA VAC SPLIT QUAD 0.5 ML IM SUSY
PREFILLED_SYRINGE | INTRAMUSCULAR | Status: AC
Start: 2016-03-15 — End: 2016-03-15
  Filled 2016-03-15: qty 0.5

## 2016-03-15 MED ORDER — DARBEPOETIN ALFA 100 MCG/0.5ML IJ SOSY
80.0000 ug | PREFILLED_SYRINGE | Freq: Once | INTRAMUSCULAR | Status: AC
  Filled 2016-03-15: qty 0.5

## 2016-03-15 MED ORDER — INFLUENZA VAC SPLIT QUAD 0.5 ML IM SUSY
0.5000 mL | PREFILLED_SYRINGE | Freq: Once | INTRAMUSCULAR | Status: AC
  Administered 2016-03-15: 0.5 mL via INTRAMUSCULAR

## 2016-03-15 NOTE — Patient Instructions (Addendum)
[place] at [facility] Discharge Instructions  RECOMMENDATIONS MADE BY THE CONSULTANT AND ANY TEST RESULTS WILL BE SENT TO YOUR REFERRING PHYSICIAN.  Hemoglobin 10.0 today. You declined aranesp injection. Fluarix vaccine given as requested. Return as scheduled.  Thank you for choosing [place] at [facility] to provide your oncology and hematology care.  To afford each patient quality time with our provider, please arrive at least 15 minutes before your scheduled appointment time.   Beginning January 23rd 2017 lab work for the [facility] will be done in the  Main lab at [facility] on 1st floor. If you have a lab appointment with the [facility] please come in thru the  Main Entrance and check in at the main information desk  You need to re-schedule your appointment should you arrive 10 or more minutes late.  We strive to give you quality time with our providers, and arriving late affects you and other patients whose appointments are after yours.  Also, if you no show three or more times for appointments you may be dismissed from the clinic at the providers discretion.     Again, thank you for choosing [facility].  Our hope is that these requests will decrease the amount of time that you wait before being seen by our physicians.       _____________________________________________________________  Should you have questions after your visit to [facility], please contact our office at [phone] between the hours of 8:30 a.m. and 4:30 p.m.  Voicemails left after 4:30 p.m. will not be returned until the following business day.  For prescription refill requests, have your pharmacy contact our office.         Resources For Cancer Patients and their Caregivers ? American Cancer Society: Can assist with transportation, wigs, general needs, runs Look Good Feel Better.        681-347-2626 ? Cancer  Care: Provides financial assistance, online support groups, medication/co-pay assistance.  1-800-813-HOPE (332) 358-4760) ? [place] Assists [place] cancer patients and their families through emotional , educational and financial support.  (813)608-5793 ? [place] DSS Where to apply for food stamps, Medicaid and utility assistance. 906-090-2720 ? RCATS: Transportation to medical appointments. 954 277 9861 ? Social Security Administration: May apply for disability if have a Stage IV cancer. 872 502 3319 (973)183-5716 ? [employer], Disability and Transit Services: Assists with nutrition, care and transit needs. [place] Support Programs: @10RELATIVEDAYS @ > Cancer Support Group  2nd Tuesday of the month 1pm-2pm, Journey Room  > Creative Journey  3rd Tuesday of the month 1130am-1pm, Journey Room  > Look Good Feel Better  1st Wednesday of the month 10am-12 noon, Journey Room (Call [place] to register [phone])

## 2016-03-15 NOTE — Progress Notes (Signed)
Hemoglobin 10.0 today. Per orders we are to HOLD aranesp if Hgb greater than 10.0. Patient informed that aranesp is indicated and ok to give. Patient declined aranesp injection states she does not want to take it unless her Hgb is less than 10.  Judi Cong presents today for injection per MD orders. Fluarix administered IM in right Upper Arm. Administration without incident. Patient tolerated well.  Patient stable and ambulatory on discharge home to self.

## 2016-03-29 ENCOUNTER — Encounter (HOSPITAL_BASED_OUTPATIENT_CLINIC_OR_DEPARTMENT_OTHER): Payer: Medicare Other | Admitting: Oncology

## 2016-03-29 ENCOUNTER — Encounter (HOSPITAL_COMMUNITY): Payer: Medicare Other

## 2016-03-29 ENCOUNTER — Encounter (HOSPITAL_COMMUNITY): Payer: Self-pay | Admitting: Oncology

## 2016-03-29 VITALS — BP 135/69 | HR 66 | Temp 98.1°F | Resp 18 | Wt 229.8 lb

## 2016-03-29 DIAGNOSIS — D631 Anemia in chronic kidney disease: Secondary | ICD-10-CM | POA: Diagnosis not present

## 2016-03-29 DIAGNOSIS — N184 Chronic kidney disease, stage 4 (severe): Secondary | ICD-10-CM | POA: Diagnosis not present

## 2016-03-29 DIAGNOSIS — N189 Chronic kidney disease, unspecified: Secondary | ICD-10-CM

## 2016-03-29 DIAGNOSIS — D649 Anemia, unspecified: Secondary | ICD-10-CM

## 2016-03-29 LAB — COMPREHENSIVE METABOLIC PANEL
ALK PHOS: 81 U/L (ref 38–126)
ALT: 11 U/L — AB (ref 14–54)
AST: 17 U/L (ref 15–41)
Albumin: 3.4 g/dL — ABNORMAL LOW (ref 3.5–5.0)
Anion gap: 6 (ref 5–15)
BILIRUBIN TOTAL: 0.5 mg/dL (ref 0.3–1.2)
BUN: 37 mg/dL — AB (ref 6–20)
CALCIUM: 8.8 mg/dL — AB (ref 8.9–10.3)
CO2: 22 mmol/L (ref 22–32)
CREATININE: 3.79 mg/dL — AB (ref 0.44–1.00)
Chloride: 112 mmol/L — ABNORMAL HIGH (ref 101–111)
GFR calc Af Amer: 14 mL/min — ABNORMAL LOW (ref >60)
GFR, EST NON AFRICAN AMERICAN: 12 mL/min — AB (ref >60)
Glucose, Bld: 123 mg/dL — ABNORMAL HIGH (ref 65–99)
POTASSIUM: 4.2 mmol/L (ref 3.5–5.1)
Sodium: 140 mmol/L (ref 135–145)
TOTAL PROTEIN: 6.6 g/dL (ref 6.5–8.1)

## 2016-03-29 LAB — FERRITIN: FERRITIN: 314 ng/mL — AB (ref 11–307)

## 2016-03-29 LAB — CBC
HEMATOCRIT: 29.5 % — AB (ref 36.0–46.0)
HEMOGLOBIN: 9.4 g/dL — AB (ref 12.0–15.0)
MCH: 30.8 pg (ref 26.0–34.0)
MCHC: 31.9 g/dL (ref 30.0–36.0)
MCV: 96.7 fL (ref 78.0–100.0)
Platelets: 210 10*3/uL (ref 150–400)
RBC: 3.05 MIL/uL — ABNORMAL LOW (ref 3.87–5.11)
RDW: 14 % (ref 11.5–15.5)
WBC: 9.6 10*3/uL (ref 4.0–10.5)

## 2016-03-29 MED ORDER — DARBEPOETIN ALFA 100 MCG/0.5ML IJ SOSY
PREFILLED_SYRINGE | INTRAMUSCULAR | Status: AC
  Filled 2016-03-29: qty 0.5

## 2016-03-29 MED ORDER — DARBEPOETIN ALFA 100 MCG/0.5ML IJ SOSY
80.0000 ug | PREFILLED_SYRINGE | Freq: Once | INTRAMUSCULAR | Status: AC
  Administered 2016-03-29: 80 ug via SUBCUTANEOUS

## 2016-03-29 NOTE — Patient Instructions (Signed)
[place] at [facility] Discharge Instructions  RECOMMENDATIONS MADE BY THE CONSULTANT AND ANY TEST RESULTS WILL BE SENT TO YOUR REFERRING PHYSICIAN.  Aranesp today  Change Aranesp to every 4 weeks  Labs every 4 weeks  Return in 12 weeks for follow up  Thank you for choosing [place] at [facility] to provide your oncology and hematology care.  To afford each patient quality time with our provider, please arrive at least 15 minutes before your scheduled appointment time.   Beginning January 23rd 2017 lab work for the [facility] will be done in the  Main lab at [facility] on 1st floor. If you have a lab appointment with the [place] please come in thru the  Main Entrance and check in at the main information desk  You need to re-schedule your appointment should you arrive 10 or more minutes late.  We strive to give you quality time with our providers, and arriving late affects you and other patients whose appointments are after yours.  Also, if you no show three or more times for appointments you may be dismissed from the clinic at the providers discretion.     Again, thank you for choosing [facility].  Our hope is that these requests will decrease the amount of time that you wait before being seen by our physicians.       _____________________________________________________________  Should you have questions after your visit to [facility], please contact our office at [phone] between the hours of 8:30 a.m. and 4:30 p.m.  Voicemails left after 4:30 p.m. will not be returned until the following business day.  For prescription refill requests, have your pharmacy contact our office.         Resources For Cancer Patients and their Caregivers ? American Cancer Society: Can assist with transportation, wigs, general needs, runs Look Good Feel Better.        204 495 2151 ? Cancer Care: Provides  financial assistance, online support groups, medication/co-pay assistance.  1-800-813-HOPE 250-815-4469) ? [place] Assists [place] cancer patients and their families through emotional , educational and financial support.  818-489-7748 ? [place] DSS Where to apply for food stamps, Medicaid and utility assistance. 857-238-0511 ? [employer]ATS: Transportation to medical appointments. (910)852-7995 ? Social Security Administration: May apply for disability if have a Stage IV cancer. 909-534-5200 (224)290-8461 ? [employer], Disability and Transit Services: Assists with nutrition, care and transit needs. [place] Support Programs: @10RELATIVEDAYS @ > Cancer Support Group  2nd Tuesday of the month 1pm-2pm, Journey Room  > Creative Journey  3rd Tuesday of the month 1130am-1pm, Journey Room  > Look Good Feel Better  1st Wednesday of the month 10am-12 noon, Journey Room (Call [place] to register [phone])

## 2016-03-29 NOTE — Progress Notes (Signed)
Shannon [name] presents today for injection per MD orders. Aranesp 65mcg administered SQ in right Abdomen. Administration without incident. Patient tolerated well.

## 2016-03-29 NOTE — Assessment & Plan Note (Addendum)
Anemia of chronic renal disease, stage IV, on Aranesp every 2 weeks at renal dosing having transferred her care to [facility] from [facility] where she was previously under the care of Dr. Jacquiline Doe.  TODAY, patient wants to change dosing to every 4 weeks.  It is noted that she also has a reported history of esophageal varices and GI AVMs.    Labs today: CBC diff, CMET, ferritin.  I personally reviewed and went over laboratory results with the patient.  The results are noted within this dictation.  The patient wants to change her injections to every 4 weeks.  Prior to day, her last injection was on 02/02/2016 as her HGB was > 10 g/dL.  I think given this, it is not entirely unreasonable.  We discussed the pros and cons of this change.  She is accepting of the risks associated with decreasing the frequency of her lab checks.  She knows that based upon her trend, we may require her to change back to every 2 week dosing.  She is agreeable to all of this.  Supportive therapy plan altered accordingly.  Additional labs in 90 days: CMET, ferritin.  She will return every 4 weeks for labs and ESA if treatment parameters are met.  Of note, she reports being informed by Fran Lowes, MD (Nephrology) that she is not to received Aranesp for a HGB > 10 g/dL.  Return in 12 weeks for follow-up.

## 2016-03-29 NOTE — Progress Notes (Signed)
Shannon Chroman, MD [place] [address]  Anemia of renal disease - Plan: ARANESP TREATMENT CONDITION, SCHEDULING COMMUNICATION INJECTION, SCHEDULING COMMUNICATION LAB, Darbepoetin Alfa (ARANESP) injection 80 mcg  CURRENT THERAPY: Aranesp 80 mcg every 2 weeks  INTERVAL HISTORY: Shannon [name] 60 y.o. female returns for followup of Anemia of chronic renal disease, Stage IV.  She is doing very well.  She notes good tolerance of ESA therapy.  She reports that Fran Lowes, MD (Nephrology) requests her target HGB should be around 10 g/dL (instead of the typical 11 g/dL target goal).  She denies any blood in her stool or dark stools.  Review of Systems  Constitutional: Negative.  Negative for chills, fever and weight loss.  HENT: Negative.   Eyes: Negative.   Respiratory: Negative.  Negative for cough.   Cardiovascular: Negative.  Negative for chest pain.  Gastrointestinal: Negative.  Negative for blood in stool, constipation, diarrhea, melena, nausea and vomiting.  Genitourinary: Negative.   Musculoskeletal: Negative.   Skin: Negative.   Neurological: Negative.  Negative for weakness.  Endo/Heme/Allergies: Negative.   Psychiatric/Behavioral: Negative.     Past Medical History:  Diagnosis Date  . Absolute anemia 11/10/2015  . Anemia   . Anemia of renal disease 11/10/2015  . Asthma    has Albuterol inhaler prn  . CHF (congestive heart failure) ([place])    last echo showed EF 20%  . Chronic kidney disease    Membranous nephropathy ( Seen at [facility])  . Gastric ulcer   . History of blood transfusion    no abnormal reaction noted  . History of bronchitis    last time 3-56yr ago  . Hyperlipidemia    takes Pravastatin daily  . Hypertension    takes Imdur and Hydralazine daily as well as Coreg  . Hypothyroidism    takes Synthroid daily  . Itching    on legs-has a Kenalog cream  . Metabolic bone disease   . Nausea    takes Zofran prn  . Obesity   .  Peripheral edema    takes Torsemide daily  . Thyroid disease    hypothyroidism    Past Surgical History:  Procedure Laterality Date  . AV FISTULA PLACEMENT Left 07/22/2012   Procedure: INSERTION OF ARTERIOVENOUS GORE-TEX GRAFT ARM;  Surgeon: CAngelia Mould MD;  [facility] [facility]  Service: Vascular;  Laterality: Left;  . CESAREAN SECTION     30+yrs ago  . COLONOSCOPY    . left arm surgery with pin     15+yrs ago    Family History  Problem Relation Age of Onset  . Kidney disease Other   . Diabetes Mother     Social History   Social History  . Marital status: Single    Spouse name: N/A  . Number of children: N/A  . Years of education: N/A   Social History Main Topics  . Smoking status: Current Every Day Smoker    Packs/day: 1.00    Years: 40.00    Types: Cigarettes    Start date: 05/14/1968  . Smokeless tobacco: Never Used  . Alcohol use No     Comment: nothing in 2-378yr  . Drug use: No  . Sexual activity: Not Currently   Other Topics Concern  . None   Social History Narrative  . None     PHYSICAL EXAMINATION  ECOG PERFORMANCE STATUS: 0 - Asymptomatic  Vitals:   03/29/16 1427  BP: 135/69  Pulse: 66  Resp: 18  Temp: 98.1 F (36.7 C)    GENERAL:alert, no distress, well nourished, well developed, comfortable, cooperative, obese, smiling and unaccompanied  SKIN: skin color, texture, turgor are normal, no rashes or significant lesions HEAD: Normocephalic, No masses, lesions, tenderness or abnormalities EYES: normal, EOMI, Conjunctiva are pink and non-injected EARS: External ears normal OROPHARYNX:lips, buccal mucosa, and tongue normal and mucous membranes are moist  NECK: supple, no adenopathy, trachea midline LYMPH:  no palpable lymphadenopathy BREAST:not examined LUNGS: clear to auscultation  HEART: regular rate & rhythm ABDOMEN:abdomen soft, obese and normal bowel sounds BACK: Back symmetric, no curvature. EXTREMITIES:less then 2 second  capillary refill, no skin discoloration, no cyanosis  NEURO: alert & oriented x 3 with fluent speech, no focal motor/sensory deficits, gait normal   LABORATORY DATA: CBC    Component Value Date/Time   WBC 9.6 03/29/2016 1348   RBC 3.05 (L) 03/29/2016 1348   HGB 9.4 (L) 03/29/2016 1348   HCT 29.5 (L) 03/29/2016 1348   PLT 210 03/29/2016 1348   MCV 96.7 03/29/2016 1348   MCH 30.8 03/29/2016 1348   MCHC 31.9 03/29/2016 1348   RDW 14.0 03/29/2016 1348   LYMPHSABS 1.4 11/10/2015 1228   MONOABS 0.2 11/10/2015 1228   EOSABS 0.1 11/10/2015 1228   BASOSABS 0.0 11/10/2015 1228      Chemistry      Component Value Date/Time   NA 140 03/29/2016 1348   K 4.2 03/29/2016 1348   CL 112 (H) 03/29/2016 1348   CO2 22 03/29/2016 1348   BUN 37 (H) 03/29/2016 1348   CREATININE 3.79 (H) 03/29/2016 1348      Component Value Date/Time   CALCIUM 8.8 (L) 03/29/2016 1348   ALKPHOS 81 03/29/2016 1348   AST 17 03/29/2016 1348   ALT 11 (L) 03/29/2016 1348   BILITOT 0.5 03/29/2016 1348        PENDING LABS:   RADIOGRAPHIC STUDIES:  No results found.   PATHOLOGY:    ASSESSMENT AND PLAN:  Anemia of renal disease Anemia of chronic renal disease, stage IV, on Aranesp every 2 weeks at renal dosing having transferred her care to [facility] from [facility] where she was previously under the care of Dr. Jacquiline Doe.  TODAY, patient wants to change dosing to every 4 weeks.  It is noted that she also has a reported history of esophageal varices and GI AVMs.    Labs today: CBC diff, CMET, ferritin.  I personally reviewed and went over laboratory results with the patient.  The results are noted within this dictation.  The patient wants to change her injections to every 4 weeks.  Prior to day, her last injection was on 02/02/2016 as her HGB was > 10 g/dL.  I think given this, it is not entirely unreasonable.  We discussed the pros and cons of this change.  She is accepting of the risks associated with decreasing the  frequency of her lab checks.  She knows that based upon her trend, we may require her to change back to every 2 week dosing.  She is agreeable to all of this.  Supportive therapy plan altered accordingly.  Additional labs in 90 days: CMET, ferritin.  She will return every 4 weeks for labs and ESA if treatment parameters are met.  Of note, she reports being informed by Fran Lowes, MD (Nephrology) that she is not to received Aranesp for a HGB > 10 g/dL.  Return in 12 weeks for follow-up.  ORDERS PLACED FOR THIS ENCOUNTER: Orders Placed This Encounter  Procedures  . ARANESP TREATMENT CONDITION  . SCHEDULING COMMUNICATION INJECTION  . SCHEDULING COMMUNICATION LAB    MEDICATIONS PRESCRIBED THIS ENCOUNTER: Meds ordered this encounter  Medications  . Darbepoetin Alfa (ARANESP) injection 80 mcg    THERAPY PLAN:  Change Aranesp therapy every 4 weeks.  All questions were answered. The patient knows to call the clinic with any problems, questions or concerns. We can certainly see the patient much sooner if necessary.  Patient and plan discussed with Dr. Ancil Linsey and she is in agreement with the aforementioned.   This note is electronically signed by: Doy Mince 03/29/2016 2:48 PM

## 2016-04-19 ENCOUNTER — Encounter (HOSPITAL_COMMUNITY): Payer: Self-pay | Admitting: Emergency Medicine

## 2016-04-19 ENCOUNTER — Emergency Department (HOSPITAL_COMMUNITY)
Admission: EM | Admit: 2016-04-19 | Discharge: 2016-04-19 | Disposition: A | Discharge status: Home / Self Care | Payer: Medicare Other | Attending: Emergency Medicine | Admitting: Emergency Medicine

## 2016-04-19 DIAGNOSIS — J45909 Unspecified asthma, uncomplicated: Secondary | ICD-10-CM | POA: Insufficient documentation

## 2016-04-19 DIAGNOSIS — Z79899 Other long term (current) drug therapy: Secondary | ICD-10-CM | POA: Insufficient documentation

## 2016-04-19 DIAGNOSIS — N186 End stage renal disease: Secondary | ICD-10-CM | POA: Diagnosis not present

## 2016-04-19 DIAGNOSIS — I509 Heart failure, unspecified: Secondary | ICD-10-CM | POA: Diagnosis not present

## 2016-04-19 DIAGNOSIS — F17210 Nicotine dependence, cigarettes, uncomplicated: Secondary | ICD-10-CM | POA: Diagnosis not present

## 2016-04-19 DIAGNOSIS — N289 Disorder of kidney and ureter, unspecified: Secondary | ICD-10-CM

## 2016-04-19 DIAGNOSIS — R109 Unspecified abdominal pain: Secondary | ICD-10-CM | POA: Diagnosis present

## 2016-04-19 DIAGNOSIS — K529 Noninfective gastroenteritis and colitis, unspecified: Secondary | ICD-10-CM

## 2016-04-19 DIAGNOSIS — E039 Hypothyroidism, unspecified: Secondary | ICD-10-CM | POA: Insufficient documentation

## 2016-04-19 DIAGNOSIS — I132 Hypertensive heart and chronic kidney disease with heart failure and with stage 5 chronic kidney disease, or end stage renal disease: Secondary | ICD-10-CM | POA: Diagnosis not present

## 2016-04-19 DIAGNOSIS — N189 Chronic kidney disease, unspecified: Secondary | ICD-10-CM

## 2016-04-19 LAB — CBC WITH DIFFERENTIAL/PLATELET
BASOS ABS: 0 10*3/uL (ref 0.0–0.1)
Basophils Relative: 0 %
EOS ABS: 0.1 10*3/uL (ref 0.0–0.7)
EOS PCT: 1 %
HCT: 36.2 % (ref 36.0–46.0)
HEMOGLOBIN: 11.6 g/dL — AB (ref 12.0–15.0)
LYMPHS ABS: 0.5 10*3/uL — AB (ref 0.7–4.0)
LYMPHS PCT: 4 %
MCH: 31.6 pg (ref 26.0–34.0)
MCHC: 32 g/dL (ref 30.0–36.0)
MCV: 98.6 fL (ref 78.0–100.0)
Monocytes Absolute: 0.5 10*3/uL (ref 0.1–1.0)
Monocytes Relative: 5 %
NEUTROS PCT: 90 %
Neutro Abs: 10.9 10*3/uL — ABNORMAL HIGH (ref 1.7–7.7)
PLATELETS: 238 10*3/uL (ref 150–400)
RBC: 3.67 MIL/uL — AB (ref 3.87–5.11)
RDW: 14.7 % (ref 11.5–15.5)
WBC: 12 10*3/uL — AB (ref 4.0–10.5)

## 2016-04-19 LAB — LIPASE, BLOOD: LIPASE: 50 U/L (ref 11–51)

## 2016-04-19 LAB — COMPREHENSIVE METABOLIC PANEL
ALK PHOS: 76 U/L (ref 38–126)
ALT: 16 U/L (ref 14–54)
AST: 17 U/L (ref 15–41)
Albumin: 3.9 g/dL (ref 3.5–5.0)
Anion gap: 10 (ref 5–15)
BILIRUBIN TOTAL: 0.6 mg/dL (ref 0.3–1.2)
BUN: 52 mg/dL — AB (ref 6–20)
CALCIUM: 9 mg/dL (ref 8.9–10.3)
CO2: 20 mmol/L — ABNORMAL LOW (ref 22–32)
CREATININE: 4.2 mg/dL — AB (ref 0.44–1.00)
Chloride: 113 mmol/L — ABNORMAL HIGH (ref 101–111)
GFR calc Af Amer: 12 mL/min — ABNORMAL LOW (ref >60)
GFR, EST NON AFRICAN AMERICAN: 11 mL/min — AB (ref >60)
GLUCOSE: 111 mg/dL — AB (ref 65–99)
POTASSIUM: 4.3 mmol/L (ref 3.5–5.1)
Sodium: 143 mmol/L (ref 135–145)
TOTAL PROTEIN: 7.7 g/dL (ref 6.5–8.1)

## 2016-04-19 LAB — URINALYSIS, ROUTINE W REFLEX MICROSCOPIC
BACTERIA UA: NONE SEEN
Bilirubin Urine: NEGATIVE
GLUCOSE, UA: 50 mg/dL — AB
Ketones, ur: NEGATIVE mg/dL
Leukocytes, UA: NEGATIVE
Nitrite: NEGATIVE
PH: 6 (ref 5.0–8.0)
Protein, ur: 100 mg/dL — AB
SPECIFIC GRAVITY, URINE: 1.013 (ref 1.005–1.030)

## 2016-04-19 MED ORDER — DIPHENOXYLATE-ATROPINE 2.5-0.025 MG PO TABS: 1.0000 | ORAL_TABLET | Freq: Four times a day (QID) | ORAL | 0 refills | Status: DC | PRN

## 2016-04-19 MED ORDER — ONDANSETRON 8 MG PO TBDP: 8.0000 mg | ORAL_TABLET | Freq: Three times a day (TID) | ORAL | 0 refills | Status: DC | PRN

## 2016-04-19 MED ORDER — SODIUM CHLORIDE 0.9 % IV BOLUS (SEPSIS)
500.0000 mL | Freq: Once | INTRAVENOUS | Status: AC
  Administered 2016-04-19: 500 mL via INTRAVENOUS

## 2016-04-19 MED ORDER — ONDANSETRON HCL 4 MG/2ML IJ SOLN
4.0000 mg | Freq: Once | INTRAMUSCULAR | Status: AC
  Administered 2016-04-19: 4 mg via INTRAVENOUS
  Filled 2016-04-19: qty 2

## 2016-04-19 MED ORDER — DIPHENOXYLATE-ATROPINE 2.5-0.025 MG PO TABS
2.0000 | ORAL_TABLET | Freq: Once | ORAL | Status: AC
Start: 2016-04-19 — End: 2016-04-19
  Administered 2016-04-19: 2 via ORAL
  Filled 2016-04-19: qty 2

## 2016-04-19 NOTE — ED Triage Notes (Signed)
abd pain with n/v/d since 10pm last night.

## 2016-04-19 NOTE — ED Notes (Signed)
Pt ambulated to BR. Has had one loose stool since arrival to ED. No vomiting

## 2016-04-19 NOTE — Discharge Instructions (Signed)
Make sure you are drinking plenty of fluids.  Use the medicines prescribed for nausea, vomiting and if your diarrhea persists.  See your doctor or return here for any persistent or worsening symptoms not controlled by the medicines.

## 2016-04-19 NOTE — ED Notes (Addendum)
Pt up x2 to bathroom with diarrhea. No vomiting

## 2016-04-19 NOTE — ED Notes (Signed)
Pt given po fluids. 

## 2016-04-21 NOTE — ED Provider Notes (Signed)
[place] DEPT Provider Note   CSN: [id number] Arrival date & time: 04/19/16  4656     History   Chief Complaint Chief Complaint  Patient presents with  . Abdominal Pain    HPI Shannon [name] is a 60 y.o. female with past medical history as outlined below, most significant for advanced renal disease but not on dialysis yet, presenting with nausea, vomiting and diarrhea since late last night.  She denies abdominal pain except for cramping associated with diarrhea. She also denies fevers, chills, other household members with similar symptoms.  She does not suspect a food borne source of her symptoms.  She has taken no medicines for her symptoms prior to arrival. She has had TNTC episodes of non blood diarrhea, less emesis.  The history is provided by the patient.  Abdominal Pain   Associated symptoms include diarrhea, nausea and vomiting. Pertinent negatives include fever, dysuria, headaches and arthralgias.    Past Medical History:  Diagnosis Date  . Absolute anemia 11/10/2015  . Anemia   . Anemia of renal disease 11/10/2015  . Asthma    has Albuterol inhaler prn  . CHF (congestive heart failure) ([place])    last echo showed EF 20%  . Chronic kidney disease    Membranous nephropathy ( Seen at [facility])  . Gastric ulcer   . History of blood transfusion    no abnormal reaction noted  . History of bronchitis    last time 3-30yrs ago  . Hyperlipidemia    takes Pravastatin daily  . Hypertension    takes Imdur and Hydralazine daily as well as Coreg  . Hypothyroidism    takes Synthroid daily  . Itching    on legs-has a Kenalog cream  . Metabolic bone disease   . Nausea    takes Zofran prn  . Obesity   . Peripheral edema    takes Torsemide daily  . Thyroid disease    hypothyroidism    Patient Active Problem List   Diagnosis Date Noted  . Absolute anemia 11/10/2015  . Anemia of renal disease 11/10/2015  . Cardiomyopathy ([place]) 04/21/2013  . HTN (hypertension)  04/21/2013  . Hyperlipidemia 04/21/2013  . Tobacco abuse 04/21/2013  . End stage renal disease ([place]) 08/06/2012  . Chronic kidney disease, stage IV (severe) ([place]) 07/09/2012    Past Surgical History:  Procedure Laterality Date  . AV FISTULA PLACEMENT Left 07/22/2012   Procedure: INSERTION OF ARTERIOVENOUS GORE-TEX GRAFT ARM;  Surgeon: Angelia Mould, MD;  Location: [facility];  Service: Vascular;  Laterality: Left;  . CESAREAN SECTION     30+yrs ago  . COLONOSCOPY    . left arm surgery with pin     15+yrs ago    OB History    No data available       Home Medications    Prior to Admission medications   Medication Sig Start Date End Date Taking? Authorizing Provider  albuterol (PROVENTIL HFA;VENTOLIN HFA) 108 (90 BASE) MCG/ACT inhaler Inhale into the lungs every 6 (six) hours as needed for wheezing.   Yes Historical Provider, MD  carvedilol (COREG) 25 MG tablet Take 25 mg by mouth daily.  04/09/16  Yes Historical Provider, MD  clobetasol cream (TEMOVATE) 8.12 % Apply 1 application topically 2 (two) times daily. 03/26/16  Yes Historical Provider, MD  hydrALAZINE (APRESOLINE) 50 MG tablet Take 50 mg by mouth every 8 (eight) hours.   Yes Historical Provider, MD  isosorbide mononitrate (IMDUR) 30 MG 24 hr tablet  Take 30 mg by mouth daily.   Yes Historical Provider, MD  lisinopril (PRINIVIL,ZESTRIL) 20 MG tablet Take 1 tablet by mouth daily. 01/22/16  Yes Historical Provider, MD  metolazone (ZAROXOLYN) 5 MG tablet Take 5 mg by mouth 2 (two) times daily as needed. Reported on 10/05/2015   Yes Historical Provider, MD  pravastatin (PRAVACHOL) 80 MG tablet Take 80 mg by mouth daily.   Yes Historical Provider, MD  raloxifene (EVISTA) 60 MG tablet Take 60 mg by mouth daily.   Yes Historical Provider, MD  sodium bicarbonate 650 MG tablet Take 650 mg by mouth 3 (three) times daily.   Yes Historical Provider, MD  torsemide (DEMADEX) 20 MG tablet Take 20 mg by mouth daily.   Yes Historical  Provider, MD  Vitamin D, Ergocalciferol, (DRISDOL) 50000 UNITS CAPS Take 50,000 Units by mouth every 7 (seven) days. On Tuesday   Yes Historical Provider, MD  diphenoxylate-atropine (LOMOTIL) 2.5-0.025 MG tablet Take 1 tablet by mouth 4 (four) times daily as needed for diarrhea or loose stools. 04/19/16   Evalee Jefferson, PA-C  ondansetron (ZOFRAN ODT) 8 MG disintegrating tablet Take 1 tablet (8 mg total) by mouth every 8 (eight) hours as needed for nausea or vomiting. 04/19/16   Evalee Jefferson, PA-C    Family History Family History  Problem Relation Age of Onset  . Kidney disease Other   . Diabetes Mother     Social History Social History  Substance Use Topics  . Smoking status: Current Every Day Smoker    Packs/day: 1.00    Years: 40.00    Types: Cigarettes    Start date: 05/14/1968  . Smokeless tobacco: Never Used  . Alcohol use No     Comment: nothing in 2-78yrs      Allergies   Corticosteroids and Rituximab   Review of Systems Review of Systems  Constitutional: Negative for chills and fever.  HENT: Negative for congestion and sore throat.   Eyes: Negative.   Respiratory: Negative for chest tightness and shortness of breath.   Cardiovascular: Negative for chest pain.  Gastrointestinal: Positive for diarrhea, nausea and vomiting. Negative for abdominal distention and abdominal pain.  Genitourinary: Negative.  Negative for dysuria.  Musculoskeletal: Negative for arthralgias, joint swelling and neck pain.  Skin: Negative.  Negative for rash and wound.  Neurological: Negative for dizziness, weakness, light-headedness, numbness and headaches.  Psychiatric/Behavioral: Negative.      Physical Exam Updated Vital Signs BP 142/73   Pulse 69   Temp 98.4 F (36.9 C)   Resp 18   Ht 5\' 3"  (1.6 m)   Wt 99.8 kg   SpO2 95%   BMI 38.97 kg/m   Physical Exam  Constitutional: She appears well-developed and well-nourished.  HENT:  Head: Normocephalic and atraumatic.  Eyes:  Conjunctivae are normal.  Neck: Normal range of motion.  Cardiovascular: Normal rate, regular rhythm, normal heart sounds and intact distal pulses.   Pulmonary/Chest: Effort normal and breath sounds normal. She has no wheezes.  Abdominal: Soft. She exhibits no distension and no mass. Bowel sounds are increased. There is no tenderness. There is no guarding.  Hyperactive bs.  No distention, no increased tympany. Soft.  Musculoskeletal: Normal range of motion.  Neurological: She is alert.  Skin: Skin is warm and dry.  Psychiatric: She has a normal mood and affect.  Nursing note and vitals reviewed.    ED Treatments / Results  Labs (all labs ordered are listed, but only abnormal results are displayed) Labs  Reviewed  CBC WITH DIFFERENTIAL/PLATELET - Abnormal; Notable for the following:       Result Value   WBC 12.0 (*)    RBC 3.67 (*)    Hemoglobin 11.6 (*)    Neutro Abs 10.9 (*)    Lymphs Abs 0.5 (*)    All other components within normal limits  COMPREHENSIVE METABOLIC PANEL - Abnormal; Notable for the following:    Chloride 113 (*)    CO2 20 (*)    Glucose, Bld 111 (*)    BUN 52 (*)    Creatinine, Ser 4.20 (*)    GFR calc non Af Amer 11 (*)    GFR calc Af Amer 12 (*)    All other components within normal limits  URINALYSIS, ROUTINE W REFLEX MICROSCOPIC - Abnormal; Notable for the following:    Glucose, UA 50 (*)    Hgb urine dipstick SMALL (*)    Protein, ur 100 (*)    All other components within normal limits  LIPASE, BLOOD    EKG  EKG Interpretation None       Radiology No results found.  Procedures Procedures (including critical care time)  Medications Ordered in ED Medications  ondansetron (ZOFRAN) injection 4 mg (4 mg Intravenous Given 04/19/16 1025)  sodium chloride 0.9 % bolus 500 mL (0 mLs Intravenous Stopped 04/19/16 1130)  diphenoxylate-atropine (LOMOTIL) 2.5-0.025 MG per tablet 2 tablet (2 tablets Oral Given 04/19/16 1156)     Initial Impression /  Assessment and Plan / ED Course  I have reviewed the triage vital signs and the nursing notes.  Pertinent labs & imaging results that were available during my care of the patient were reviewed by me and considered in my medical decision making (see chart for details).  Clinical Course     Labs reviewed and stable, renal insuffiency is stable. She was IV fluids, then PO challenge which she tolerated well.  She had no active vomiting in the ed, but 2 further episodes of diarrhea.  She was given lomotil prior to dc home.  Encouraged rest, increased fluids, clear diet, lomotil and zofran prescribed for prn use.  Plan to f/u with pcp if sx persist beyond 24 hours.  Final Clinical Impressions(s) / ED Diagnoses   Final diagnoses:  Gastroenteritis  Chronic renal impairment, unspecified CKD stage    New Prescriptions Discharge Medication List as of 04/19/2016 12:17 PM    START taking these medications   Details  diphenoxylate-atropine (LOMOTIL) 2.5-0.025 MG tablet Take 1 tablet by mouth 4 (four) times daily as needed for diarrhea or loose stools., Starting Thu 04/19/2016, Print    ondansetron (ZOFRAN ODT) 8 MG disintegrating tablet Take 1 tablet (8 mg total) by mouth every 8 (eight) hours as needed for nausea or vomiting., Starting Thu 04/19/2016, Print         Evalee Jefferson, PA-C 04/21/16 1625    Virgel Manifold, MD 04/22/16 [phone]

## 2016-04-27 ENCOUNTER — Encounter (HOSPITAL_COMMUNITY): Payer: Medicare Other

## 2016-04-27 ENCOUNTER — Encounter (HOSPITAL_COMMUNITY): Payer: Medicare Other | Attending: Hematology & Oncology

## 2016-04-27 VITALS — BP 96/49 | HR 70 | Temp 98.6°F | Resp 18

## 2016-04-27 DIAGNOSIS — D631 Anemia in chronic kidney disease: Secondary | ICD-10-CM

## 2016-04-27 DIAGNOSIS — N189 Chronic kidney disease, unspecified: Secondary | ICD-10-CM | POA: Diagnosis not present

## 2016-04-27 DIAGNOSIS — Q2733 Arteriovenous malformation of digestive system vessel: Secondary | ICD-10-CM | POA: Diagnosis not present

## 2016-04-27 DIAGNOSIS — D649 Anemia, unspecified: Secondary | ICD-10-CM

## 2016-04-27 DIAGNOSIS — I8500 Esophageal varices without bleeding: Secondary | ICD-10-CM | POA: Insufficient documentation

## 2016-04-27 LAB — COMPREHENSIVE METABOLIC PANEL
ALBUMIN: 3.5 g/dL (ref 3.5–5.0)
ALK PHOS: 73 U/L (ref 38–126)
ALT: 18 U/L (ref 14–54)
ANION GAP: 10 (ref 5–15)
AST: 18 U/L (ref 15–41)
BILIRUBIN TOTAL: 0.2 mg/dL — AB (ref 0.3–1.2)
BUN: 43 mg/dL — ABNORMAL HIGH (ref 6–20)
CALCIUM: 8.6 mg/dL — AB (ref 8.9–10.3)
CO2: 20 mmol/L — ABNORMAL LOW (ref 22–32)
Chloride: 105 mmol/L (ref 101–111)
Creatinine, Ser: 4.71 mg/dL — ABNORMAL HIGH (ref 0.44–1.00)
GFR calc Af Amer: 11 mL/min — ABNORMAL LOW (ref >60)
GFR, EST NON AFRICAN AMERICAN: 9 mL/min — AB (ref >60)
GLUCOSE: 156 mg/dL — AB (ref 65–99)
POTASSIUM: 4.1 mmol/L (ref 3.5–5.1)
Sodium: 135 mmol/L (ref 135–145)
TOTAL PROTEIN: 6.9 g/dL (ref 6.5–8.1)

## 2016-04-27 LAB — FERRITIN: FERRITIN: 338 ng/mL — AB (ref 11–307)

## 2016-04-27 LAB — CBC
HEMATOCRIT: 30 % — AB (ref 36.0–46.0)
HEMOGLOBIN: 9.9 g/dL — AB (ref 12.0–15.0)
MCH: 32.5 pg (ref 26.0–34.0)
MCHC: 33 g/dL (ref 30.0–36.0)
MCV: 98.4 fL (ref 78.0–100.0)
Platelets: 269 10*3/uL (ref 150–400)
RBC: 3.05 MIL/uL — ABNORMAL LOW (ref 3.87–5.11)
RDW: 14.3 % (ref 11.5–15.5)
WBC: 8.7 10*3/uL (ref 4.0–10.5)

## 2016-04-27 MED ORDER — DARBEPOETIN ALFA 100 MCG/0.5ML IJ SOSY
PREFILLED_SYRINGE | INTRAMUSCULAR | Status: AC
  Filled 2016-04-27: qty 0.5

## 2016-04-27 MED ORDER — DARBEPOETIN ALFA 100 MCG/0.5ML IJ SOSY
80.0000 ug | PREFILLED_SYRINGE | Freq: Once | INTRAMUSCULAR | Status: AC
  Administered 2016-04-27: 80 ug via SUBCUTANEOUS

## 2016-04-27 NOTE — Progress Notes (Signed)
Ramesha Poster Felix tolerated Aranesp injection well without complaints or incident. Hgb 9.9 today. VSS Pt discharged self ambulatory in satisfactory condition

## 2016-04-27 NOTE — Patient Instructions (Signed)
[place] at [facility] Discharge Instructions  RECOMMENDATIONS MADE BY THE CONSULTANT AND ANY TEST RESULTS WILL BE SENT TO YOUR REFERRING PHYSICIAN.  Received Aranesp injection today. Follow-up as scheduled. Call clinic for any questions or concerns  Thank you for choosing [place] at [facility] to provide your oncology and hematology care.  To afford each patient quality time with our provider, please arrive at least 15 minutes before your scheduled appointment time.   Beginning January 23rd 2017 lab work for the [facility] will be done in the  Main lab at [facility] on 1st floor. If you have a lab appointment with the [facility] please come in thru the  Main Entrance and check in at the main information desk  You need to re-schedule your appointment should you arrive 10 or more minutes late.  We strive to give you quality time with our providers, and arriving late affects you and other patients whose appointments are after yours.  Also, if you no show three or more times for appointments you may be dismissed from the clinic at the providers discretion.     Again, thank you for choosing [facility].  Our hope is that these requests will decrease the amount of time that you wait before being seen by our physicians.       _____________________________________________________________  Should you have questions after your visit to [facility], please contact our office at [phone] between the hours of 8:30 a.m. and 4:30 p.m.  Voicemails left after 4:30 p.m. will not be returned until the following business day.  For prescription refill requests, have your pharmacy contact our office.         Resources For Cancer Patients and their Caregivers ? American Cancer Society: Can assist with transportation, wigs, general needs, runs Look Good Feel Better.        (726) 374-2163 ? Cancer Care: Provides  financial assistance, online support groups, medication/co-pay assistance.  1-800-813-HOPE 726-780-5872) ? [place] Assists [place] cancer patients and their families through emotional , educational and financial support.  (479)275-2511 ? [place] DSS Where to apply for food stamps, Medicaid and utility assistance. (212)306-6980 ? RCATS: Transportation to medical appointments. (504) 737-2649 ? Social Security Administration: May apply for disability if have a Stage IV cancer. (678) 057-1093 807-001-8300 ? [employer], Disability and Transit Services: Assists with nutrition, care and transit needs. [place] Support Programs: @10RELATIVEDAYS @ > Cancer Support Group  2nd Tuesday of the month 1pm-2pm, Journey Room  > Creative Journey  3rd Tuesday of the month 1130am-1pm, Journey Room  > Look Good Feel Better  1st Wednesday of the month 10am-12 noon, Journey Room (Call [place] to register [phone])

## 2016-05-25 ENCOUNTER — Encounter (HOSPITAL_COMMUNITY): Payer: Medicare Other

## 2016-05-25 ENCOUNTER — Encounter (HOSPITAL_COMMUNITY): Payer: Medicare Other | Attending: Hematology & Oncology

## 2016-05-25 ENCOUNTER — Encounter (HOSPITAL_COMMUNITY): Payer: Self-pay

## 2016-05-25 VITALS — BP 136/61 | HR 60 | Temp 98.2°F | Resp 18

## 2016-05-25 DIAGNOSIS — D631 Anemia in chronic kidney disease: Secondary | ICD-10-CM

## 2016-05-25 DIAGNOSIS — D649 Anemia, unspecified: Secondary | ICD-10-CM

## 2016-05-25 DIAGNOSIS — N184 Chronic kidney disease, stage 4 (severe): Secondary | ICD-10-CM | POA: Diagnosis not present

## 2016-05-25 DIAGNOSIS — N189 Chronic kidney disease, unspecified: Secondary | ICD-10-CM

## 2016-05-25 LAB — COMPREHENSIVE METABOLIC PANEL
ALBUMIN: 3.3 g/dL — AB (ref 3.5–5.0)
ALK PHOS: 73 U/L (ref 38–126)
ALT: 11 U/L — AB (ref 14–54)
AST: 16 U/L (ref 15–41)
Anion gap: 6 (ref 5–15)
BILIRUBIN TOTAL: 0.3 mg/dL (ref 0.3–1.2)
BUN: 35 mg/dL — AB (ref 6–20)
CALCIUM: 8.6 mg/dL — AB (ref 8.9–10.3)
CO2: 22 mmol/L (ref 22–32)
CREATININE: 3.76 mg/dL — AB (ref 0.44–1.00)
Chloride: 110 mmol/L (ref 101–111)
GFR calc Af Amer: 14 mL/min — ABNORMAL LOW (ref >60)
GFR calc non Af Amer: 12 mL/min — ABNORMAL LOW (ref >60)
GLUCOSE: 135 mg/dL — AB (ref 65–99)
POTASSIUM: 4.3 mmol/L (ref 3.5–5.1)
Sodium: 138 mmol/L (ref 135–145)
TOTAL PROTEIN: 6.6 g/dL (ref 6.5–8.1)

## 2016-05-25 LAB — CBC
HEMATOCRIT: 29.4 % — AB (ref 36.0–46.0)
HEMOGLOBIN: 9.7 g/dL — AB (ref 12.0–15.0)
MCH: 32.2 pg (ref 26.0–34.0)
MCHC: 33 g/dL (ref 30.0–36.0)
MCV: 97.7 fL (ref 78.0–100.0)
Platelets: 242 10*3/uL (ref 150–400)
RBC: 3.01 MIL/uL — AB (ref 3.87–5.11)
RDW: 13.6 % (ref 11.5–15.5)
WBC: 8.4 10*3/uL (ref 4.0–10.5)

## 2016-05-25 LAB — FERRITIN: Ferritin: 341 ng/mL — ABNORMAL HIGH (ref 11–307)

## 2016-05-25 MED ORDER — DARBEPOETIN ALFA 100 MCG/0.5ML IJ SOSY
80.0000 ug | PREFILLED_SYRINGE | Freq: Once | INTRAMUSCULAR | Status: AC
  Administered 2016-05-25: 80 ug via SUBCUTANEOUS
  Filled 2016-05-25: qty 0.5

## 2016-05-25 NOTE — Patient Instructions (Signed)
[place] at [facility] Discharge Instructions  RECOMMENDATIONS MADE BY THE CONSULTANT AND ANY TEST RESULTS WILL BE SENT TO YOUR REFERRING PHYSICIAN.  Aranesp injection today. Return as scheduled for lab work and injections.   Thank you for choosing [place] at [facility] to provide your oncology and hematology care.  To afford each patient quality time with our provider, please arrive at least 15 minutes before your scheduled appointment time.    If you have a lab appointment with the [facility] please come in thru the  Main Entrance and check in at the main information desk  You need to re-schedule your appointment should you arrive 10 or more minutes late.  We strive to give you quality time with our providers, and arriving late affects you and other patients whose appointments are after yours.  Also, if you no show three or more times for appointments you may be dismissed from the clinic at the providers discretion.     Again, thank you for choosing [facility].  Our hope is that these requests will decrease the amount of time that you wait before being seen by our physicians.       _____________________________________________________________  Should you have questions after your visit to [facility], please contact our office at [phone] between the hours of 8:30 a.m. and 4:30 p.m.  Voicemails left after 4:30 p.m. will not be returned until the following business day.  For prescription refill requests, have your pharmacy contact our office.       Resources For Cancer Patients and their Caregivers ? American Cancer Society: Can assist with transportation, wigs, general needs, runs Look Good Feel Better.        6104424744 ? Cancer Care: Provides financial assistance, online support groups, medication/co-pay assistance.  1-800-813-HOPE 470-225-0717) ? [facility] Assists  [place] cancer patients and their families through emotional , educational and financial support.  713-495-2140 ? [place] DSS Where to apply for food stamps, Medicaid and utility assistance. (562)554-6019 ? RCATS: Transportation to medical appointments. 623-879-8083 ? Social Security Administration: May apply for disability if have a Stage IV cancer. 3098753841 727-880-1154 ? [employer], Disability and Transit Services: Assists with nutrition, care and transit needs. [place] Support Programs: @10RELATIVEDAYS @ > Cancer Support Group  2nd Tuesday of the month 1pm-2pm, Journey Room  > Creative Journey  3rd Tuesday of the month 1130am-1pm, Journey Room  > Look Good Feel Better  1st Wednesday of the month 10am-12 noon, Journey Room (Call [place] to register [phone])

## 2016-05-25 NOTE — Progress Notes (Signed)
Shannon [name] presents today for injection per the provider's orders.  Aranesp administration without incident; see MAR for injection details.  Patient tolerated procedure well and without incident.  No questions or complaints noted at this time.

## 2016-06-22 ENCOUNTER — Ambulatory Visit (HOSPITAL_COMMUNITY): Payer: Medicare Other | Admitting: Oncology

## 2016-06-22 ENCOUNTER — Other Ambulatory Visit (HOSPITAL_COMMUNITY): Payer: Medicare Other

## 2016-06-22 ENCOUNTER — Encounter (HOSPITAL_COMMUNITY): Payer: Medicare Other

## 2016-06-22 ENCOUNTER — Ambulatory Visit (HOSPITAL_COMMUNITY): Payer: Medicare Other

## 2016-06-22 ENCOUNTER — Encounter (HOSPITAL_COMMUNITY): Payer: Medicare Other | Attending: Hematology & Oncology

## 2016-06-22 DIAGNOSIS — N189 Chronic kidney disease, unspecified: Secondary | ICD-10-CM

## 2016-06-22 DIAGNOSIS — D649 Anemia, unspecified: Secondary | ICD-10-CM | POA: Diagnosis present

## 2016-06-22 DIAGNOSIS — D631 Anemia in chronic kidney disease: Secondary | ICD-10-CM

## 2016-06-22 LAB — COMPREHENSIVE METABOLIC PANEL
ALBUMIN: 3.4 g/dL — AB (ref 3.5–5.0)
ALK PHOS: 82 U/L (ref 38–126)
ALT: 11 U/L — ABNORMAL LOW (ref 14–54)
AST: 15 U/L (ref 15–41)
Anion gap: 7 (ref 5–15)
BUN: 44 mg/dL — ABNORMAL HIGH (ref 6–20)
CALCIUM: 8.7 mg/dL — AB (ref 8.9–10.3)
CO2: 20 mmol/L — AB (ref 22–32)
Chloride: 112 mmol/L — ABNORMAL HIGH (ref 101–111)
Creatinine, Ser: 3.99 mg/dL — ABNORMAL HIGH (ref 0.44–1.00)
GFR calc Af Amer: 13 mL/min — ABNORMAL LOW (ref >60)
GFR calc non Af Amer: 11 mL/min — ABNORMAL LOW (ref >60)
GLUCOSE: 91 mg/dL (ref 65–99)
POTASSIUM: 4.5 mmol/L (ref 3.5–5.1)
SODIUM: 139 mmol/L (ref 135–145)
Total Bilirubin: 0.4 mg/dL (ref 0.3–1.2)
Total Protein: 6.8 g/dL (ref 6.5–8.1)

## 2016-06-22 LAB — CBC
HCT: 31.4 % — ABNORMAL LOW (ref 36.0–46.0)
HEMOGLOBIN: 10.4 g/dL — AB (ref 12.0–15.0)
MCH: 32.1 pg (ref 26.0–34.0)
MCHC: 33.1 g/dL (ref 30.0–36.0)
MCV: 96.9 fL (ref 78.0–100.0)
Platelets: 246 10*3/uL (ref 150–400)
RBC: 3.24 MIL/uL — ABNORMAL LOW (ref 3.87–5.11)
RDW: 13.6 % (ref 11.5–15.5)
WBC: 9.5 10*3/uL (ref 4.0–10.5)

## 2016-06-22 NOTE — Progress Notes (Signed)
Hgb 10.4 today so Aranesp held since Hgb greater than 10 per orders.Results and change in appt date given to pt who verbalized understanding Pt discharged in satisfactory condition

## 2016-06-23 LAB — FERRITIN: Ferritin: 303 ng/mL (ref 11–307)

## 2016-07-20 ENCOUNTER — Other Ambulatory Visit (HOSPITAL_COMMUNITY): Payer: Self-pay | Admitting: *Deleted

## 2016-07-20 DIAGNOSIS — D631 Anemia in chronic kidney disease: Secondary | ICD-10-CM

## 2016-07-20 DIAGNOSIS — N189 Chronic kidney disease, unspecified: Secondary | ICD-10-CM

## 2016-07-23 ENCOUNTER — Ambulatory Visit (HOSPITAL_COMMUNITY): Payer: Medicare Other

## 2016-07-23 ENCOUNTER — Other Ambulatory Visit (HOSPITAL_COMMUNITY): Payer: Medicare Other

## 2016-07-23 ENCOUNTER — Ambulatory Visit (HOSPITAL_COMMUNITY): Payer: Medicare Other | Admitting: Adult Health

## 2016-07-25 ENCOUNTER — Other Ambulatory Visit (HOSPITAL_COMMUNITY): Payer: Self-pay

## 2016-07-25 DIAGNOSIS — N183 Chronic kidney disease, stage 3 unspecified: Secondary | ICD-10-CM

## 2016-07-26 ENCOUNTER — Encounter (HOSPITAL_COMMUNITY): Payer: Self-pay

## 2016-07-26 ENCOUNTER — Encounter (HOSPITAL_COMMUNITY): Payer: Medicare Other

## 2016-07-26 ENCOUNTER — Other Ambulatory Visit (HOSPITAL_COMMUNITY): Payer: Self-pay | Admitting: Oncology

## 2016-07-26 ENCOUNTER — Encounter (HOSPITAL_COMMUNITY): Payer: Medicare Other | Attending: Oncology

## 2016-07-26 VITALS — BP 143/68 | HR 65 | Temp 98.1°F | Resp 18

## 2016-07-26 DIAGNOSIS — N183 Chronic kidney disease, stage 3 unspecified: Secondary | ICD-10-CM

## 2016-07-26 DIAGNOSIS — D631 Anemia in chronic kidney disease: Secondary | ICD-10-CM | POA: Insufficient documentation

## 2016-07-26 DIAGNOSIS — N189 Chronic kidney disease, unspecified: Secondary | ICD-10-CM

## 2016-07-26 LAB — CBC WITH DIFFERENTIAL/PLATELET
BASOS PCT: 0 %
Basophils Absolute: 0 10*3/uL (ref 0.0–0.1)
EOS ABS: 0.3 10*3/uL (ref 0.0–0.7)
Eosinophils Relative: 4 %
HCT: 30.7 % — ABNORMAL LOW (ref 36.0–46.0)
Hemoglobin: 10.2 g/dL — ABNORMAL LOW (ref 12.0–15.0)
Lymphocytes Relative: 18 %
Lymphs Abs: 1.7 10*3/uL (ref 0.7–4.0)
MCH: 32.3 pg (ref 26.0–34.0)
MCHC: 33.2 g/dL (ref 30.0–36.0)
MCV: 97.2 fL (ref 78.0–100.0)
MONO ABS: 0.3 10*3/uL (ref 0.1–1.0)
MONOS PCT: 3 %
Neutro Abs: 7.1 10*3/uL (ref 1.7–7.7)
Neutrophils Relative %: 75 %
Platelets: 254 10*3/uL (ref 150–400)
RBC: 3.16 MIL/uL — ABNORMAL LOW (ref 3.87–5.11)
RDW: 13.5 % (ref 11.5–15.5)
WBC: 9.5 10*3/uL (ref 4.0–10.5)

## 2016-07-26 LAB — RENAL FUNCTION PANEL
Albumin: 3.3 g/dL — ABNORMAL LOW (ref 3.5–5.0)
Anion gap: 7 (ref 5–15)
BUN: 39 mg/dL — ABNORMAL HIGH (ref 6–20)
CALCIUM: 8.6 mg/dL — AB (ref 8.9–10.3)
CO2: 22 mmol/L (ref 22–32)
CREATININE: 3.78 mg/dL — AB (ref 0.44–1.00)
Chloride: 107 mmol/L (ref 101–111)
GFR calc Af Amer: 14 mL/min — ABNORMAL LOW (ref >60)
GFR calc non Af Amer: 12 mL/min — ABNORMAL LOW (ref >60)
Glucose, Bld: 100 mg/dL — ABNORMAL HIGH (ref 65–99)
Phosphorus: 4.5 mg/dL (ref 2.5–4.6)
Potassium: 4.5 mmol/L (ref 3.5–5.1)
SODIUM: 136 mmol/L (ref 135–145)

## 2016-07-26 LAB — PROTEIN / CREATININE RATIO, URINE
CREATININE, URINE: 78.77 mg/dL
PROTEIN CREATININE RATIO: 1.07 mg/mg{creat} — AB (ref 0.00–0.15)
TOTAL PROTEIN, URINE: 84 mg/dL

## 2016-07-26 LAB — COMPREHENSIVE METABOLIC PANEL
ALK PHOS: 75 U/L (ref 38–126)
ALT: 10 U/L — AB (ref 14–54)
AST: 13 U/L — ABNORMAL LOW (ref 15–41)
Albumin: 3.4 g/dL — ABNORMAL LOW (ref 3.5–5.0)
Anion gap: 7 (ref 5–15)
BILIRUBIN TOTAL: 0.4 mg/dL (ref 0.3–1.2)
BUN: 40 mg/dL — ABNORMAL HIGH (ref 6–20)
CALCIUM: 8.7 mg/dL — AB (ref 8.9–10.3)
CO2: 22 mmol/L (ref 22–32)
CREATININE: 3.83 mg/dL — AB (ref 0.44–1.00)
Chloride: 107 mmol/L (ref 101–111)
GFR, EST AFRICAN AMERICAN: 14 mL/min — AB (ref >60)
GFR, EST NON AFRICAN AMERICAN: 12 mL/min — AB (ref >60)
Glucose, Bld: 99 mg/dL (ref 65–99)
Potassium: 4.5 mmol/L (ref 3.5–5.1)
Sodium: 136 mmol/L (ref 135–145)
TOTAL PROTEIN: 6.8 g/dL (ref 6.5–8.1)

## 2016-07-26 LAB — IRON AND TIBC
Iron: 46 ug/dL (ref 28–170)
Saturation Ratios: 20 % (ref 10.4–31.8)
TIBC: 228 ug/dL — ABNORMAL LOW (ref 250–450)
UIBC: 182 ug/dL

## 2016-07-26 LAB — FERRITIN: FERRITIN: 314 ng/mL — AB (ref 11–307)

## 2016-07-26 MED ORDER — DARBEPOETIN ALFA 100 MCG/0.5ML IJ SOSY
80.0000 ug | PREFILLED_SYRINGE | Freq: Once | INTRAMUSCULAR | Status: DC
  Filled 2016-07-26: qty 0.5

## 2016-07-26 NOTE — Progress Notes (Signed)
Pt states she is not supposed to get her Aranesp injection if her hemoglobin is greater than 10 per Dr. Lowanda Foster.  Caroleen Hamman, PA notified and supportive therapy plan changed to reflect this. Aranesp held today for hemoglobin of 10.2.

## 2016-07-26 NOTE — Patient Instructions (Signed)
[facility] at [facility] Discharge Instructions  RECOMMENDATIONS MADE BY THE CONSULTANT AND ANY TEST RESULTS WILL BE SENT TO YOUR REFERRING PHYSICIAN.  Aranesp injection today. Return as scheduled.   Thank you for choosing [facility] at [facility] to provide your oncology and hematology care.  To afford each patient quality time with our provider, please arrive at least 15 minutes before your scheduled appointment time.    If you have a lab appointment with the Cancer Center please come in thru the  Main Entrance and check in at the main information desk  You need to re-schedule your appointment should you arrive 10 or more minutes late.  We strive to give you quality time with our providers, and arriving late affects you and other patients whose appointments are after yours.  Also, if you no show three or more times for appointments you may be dismissed from the clinic at the providers discretion.     Again, thank you for choosing [facility].  Our hope is that these requests will decrease the amount of time that you wait before being seen by our physicians.       _____________________________________________________________  Should you have questions after your visit to [facility], please contact our office at [phone] between the hours of 8:30 a.m. and 4:30 p.m.  Voicemails left after 4:30 p.m. will not be returned until the following business day.  For prescription refill requests, have your pharmacy contact our office.       Resources For Cancer Patients and their Caregivers ? American Cancer Society: Can assist with transportation, wigs, general needs, runs Look Good Feel Better.        1-888-227-6333 ? Cancer Care: Provides financial assistance, online support groups, medication/co-pay assistance.  1-800-813-HOPE (4673) ? [facility] Assists [place] cancer patients and  their families through emotional , educational and financial support.  336-427-4357 ? [place] DSS Where to apply for food stamps, Medicaid and utility assistance. 336-342-1394 ? RCATS: Transportation to medical appointments. 336-347-2287 ? Social Security Administration: May apply for disability if have a Stage IV cancer. 336-342-7796 1-800-772-1213 ? [place] A[employer], Disability and Transit Services: Assists with nutrition, care and transit needs. 336-349-2343  Cancer Center Support Programs: @10RELATIVEDAYS@ > Cancer Support Group  2nd Tuesday of the month 1pm-2pm, Journey Room  > Creative Journey  3rd Tuesday of the month 1130am-1pm, Journey Room  > Look Good Feel Better  1st Wednesday of the month 10am-12 noon, Journey Room (Call [employer] to register [phone])   

## 2016-07-27 LAB — VITAMIN D 25 HYDROXY (VIT D DEFICIENCY, FRACTURES): VIT D 25 HYDROXY: 31 ng/mL (ref 30.0–100.0)

## 2016-07-27 LAB — PTH, INTACT AND CALCIUM
Calcium, Total (PTH): 8.7 mg/dL (ref 8.7–10.3)
PTH: 37 pg/mL (ref 15–65)

## 2016-08-13 ENCOUNTER — Ambulatory Visit (HOSPITAL_COMMUNITY): Payer: Medicare Other

## 2016-08-13 ENCOUNTER — Other Ambulatory Visit (HOSPITAL_COMMUNITY): Payer: Medicare Other

## 2016-08-27 ENCOUNTER — Encounter (HOSPITAL_COMMUNITY): Payer: Self-pay

## 2016-08-27 ENCOUNTER — Encounter (HOSPITAL_COMMUNITY): Payer: Medicare Other

## 2016-08-27 ENCOUNTER — Encounter (HOSPITAL_BASED_OUTPATIENT_CLINIC_OR_DEPARTMENT_OTHER): Payer: Medicare Other

## 2016-08-27 ENCOUNTER — Encounter (HOSPITAL_COMMUNITY): Payer: Medicare Other | Attending: Oncology | Admitting: Oncology

## 2016-08-27 VITALS — BP 128/52 | HR 72 | Temp 98.1°F | Resp 16

## 2016-08-27 DIAGNOSIS — N189 Chronic kidney disease, unspecified: Secondary | ICD-10-CM | POA: Insufficient documentation

## 2016-08-27 DIAGNOSIS — Z720 Tobacco use: Secondary | ICD-10-CM | POA: Diagnosis not present

## 2016-08-27 DIAGNOSIS — N184 Chronic kidney disease, stage 4 (severe): Secondary | ICD-10-CM

## 2016-08-27 DIAGNOSIS — D649 Anemia, unspecified: Secondary | ICD-10-CM

## 2016-08-27 DIAGNOSIS — D631 Anemia in chronic kidney disease: Secondary | ICD-10-CM | POA: Diagnosis present

## 2016-08-27 DIAGNOSIS — I10 Essential (primary) hypertension: Secondary | ICD-10-CM | POA: Diagnosis not present

## 2016-08-27 DIAGNOSIS — R5383 Other fatigue: Secondary | ICD-10-CM

## 2016-08-27 LAB — CBC WITH DIFFERENTIAL/PLATELET
BASOS ABS: 0 10*3/uL (ref 0.0–0.1)
Basophils Relative: 0 %
Eosinophils Absolute: 0.2 10*3/uL (ref 0.0–0.7)
Eosinophils Relative: 3 %
HCT: 29.4 % — ABNORMAL LOW (ref 36.0–46.0)
HEMOGLOBIN: 9.5 g/dL — AB (ref 12.0–15.0)
LYMPHS ABS: 1.7 10*3/uL (ref 0.7–4.0)
LYMPHS PCT: 19 %
MCH: 31.6 pg (ref 26.0–34.0)
MCHC: 32.3 g/dL (ref 30.0–36.0)
MCV: 97.7 fL (ref 78.0–100.0)
Monocytes Absolute: 0.2 10*3/uL (ref 0.1–1.0)
Monocytes Relative: 2 %
NEUTROS PCT: 76 %
Neutro Abs: 6.7 10*3/uL (ref 1.7–7.7)
Platelets: 231 10*3/uL (ref 150–400)
RBC: 3.01 MIL/uL — AB (ref 3.87–5.11)
RDW: 14 % (ref 11.5–15.5)
WBC: 8.8 10*3/uL (ref 4.0–10.5)

## 2016-08-27 LAB — COMPREHENSIVE METABOLIC PANEL
ALT: 13 U/L — AB (ref 14–54)
AST: 16 U/L (ref 15–41)
Albumin: 3.5 g/dL (ref 3.5–5.0)
Alkaline Phosphatase: 76 U/L (ref 38–126)
Anion gap: 8 (ref 5–15)
BUN: 48 mg/dL — ABNORMAL HIGH (ref 6–20)
CHLORIDE: 110 mmol/L (ref 101–111)
CO2: 22 mmol/L (ref 22–32)
CREATININE: 4.09 mg/dL — AB (ref 0.44–1.00)
Calcium: 8.6 mg/dL — ABNORMAL LOW (ref 8.9–10.3)
GFR, EST AFRICAN AMERICAN: 13 mL/min — AB (ref >60)
GFR, EST NON AFRICAN AMERICAN: 11 mL/min — AB (ref >60)
Glucose, Bld: 133 mg/dL — ABNORMAL HIGH (ref 65–99)
POTASSIUM: 4.3 mmol/L (ref 3.5–5.1)
Sodium: 140 mmol/L (ref 135–145)
Total Bilirubin: 0.3 mg/dL (ref 0.3–1.2)
Total Protein: 6.9 g/dL (ref 6.5–8.1)

## 2016-08-27 LAB — FERRITIN: FERRITIN: 336 ng/mL — AB (ref 11–307)

## 2016-08-27 MED ORDER — DARBEPOETIN ALFA 100 MCG/0.5ML IJ SOSY
80.0000 ug | PREFILLED_SYRINGE | Freq: Once | INTRAMUSCULAR | Status: AC
  Administered 2016-08-27: 80 ug via SUBCUTANEOUS
  Filled 2016-08-27: qty 0.5

## 2016-08-27 NOTE — Progress Notes (Signed)
Shannon [name] presents today for injection per MD orders. Aranesp 80 mcg administered SQ in left Abdomen. Administration without incident. Patient tolerated well.

## 2016-08-27 NOTE — Progress Notes (Signed)
[facility] Hematology/Oncology Consultation   Name: Shannon [name]      MRN: [id number]     Date: 08/27/2016 Time:2:58 PM   REFERRING PHYSICIAN:  Audree Camel, MD (Medical Oncology)  REASON FOR CONSULT:  Transfer of hematologic care   DIAGNOSIS:  Anemia of chronic renal disease, Stage IV  HISTORY OF PRESENT ILLNESS:   Shannon [name] is a 61 y.o. female with a medical history significant for CHF, MI, HTN, asthma, obesity, AVMs and esophageal varices who is referred to the [facility] for anemia of chronic renal disease, she was previously on  Aranesp every 4 weeks at a dose of 500 g. Currenly now on renal dosing at 80 ug every 2 weeks. Counts are excellent.   Patient's last aranesp injection was on 05/25/16 because her hemoglobin has stayed consistently above 10 g/dL. Today it is 9.5 g/dL.  The patient's nephrologist is Fran Lowes, MD (Nephrology).  She note that her renal function has been stable x 3 years.  She has not needed dialysis.  On review of systems, the patient reports some mild fatigue. She denies chest or abdominal pain. Denies SOB. Overall she feels well.  Review of Systems  Constitutional: Positive for malaise/fatigue (Mild).  HENT: Negative.   Eyes: Negative.   Respiratory: Negative.  Negative for shortness of breath.   Cardiovascular: Negative.  Negative for chest pain.  Gastrointestinal: Negative.  Negative for abdominal pain.  Genitourinary: Negative.   Musculoskeletal: Negative.   Skin: Negative.   Neurological: Negative.   Endo/Heme/Allergies: Negative.   Psychiatric/Behavioral: Negative.   All other systems reviewed and are negative. 14 point review of systems was performed and is negative except as detailed under history of present illness and above   PAST MEDICAL HISTORY:   Past Medical History:  Diagnosis Date  . Absolute anemia 11/10/2015  . Anemia   . Anemia of renal disease 11/10/2015  . Asthma    has Albuterol inhaler prn  . CHF (congestive heart failure) ([place])    last echo showed EF 20%  . Chronic kidney disease    Membranous nephropathy ( Seen at [facility])  . Gastric ulcer   . History of blood transfusion    no abnormal reaction noted  . History of bronchitis    last time 3-19yrs ago  . Hyperlipidemia    takes Pravastatin daily  . Hypertension    takes Imdur and Hydralazine daily as well as Coreg  . Hypothyroidism    takes Synthroid daily  . Itching    on legs-has a Kenalog cream  . Metabolic bone disease   . Nausea    takes Zofran prn  . Obesity   . Peripheral edema    takes Torsemide daily  . Thyroid disease    hypothyroidism    ALLERGIES: Allergies  Allergen Reactions  . Corticosteroids Hives, Itching and Nausea And Vomiting  . Rituximab     wheezing      MEDICATIONS: I have reviewed the patient's current medications.    Current Outpatient Prescriptions on File Prior to Visit  Medication Sig Dispense Refill  . albuterol (PROVENTIL HFA;VENTOLIN HFA) 108 (90 BASE) MCG/ACT inhaler Inhale into the lungs every 6 (six) hours as needed for wheezing.    . carvedilol (COREG) 25 MG tablet Take 25 mg by mouth daily.     . clobetasol cream (TEMOVATE) 9.52 % Apply 1 application topically 2 (two) times daily.    . diphenoxylate-atropine (  LOMOTIL) 2.5-0.025 MG tablet Take 1 tablet by mouth 4 (four) times daily as needed for diarrhea or loose stools. 15 tablet 0  . hydrALAZINE (APRESOLINE) 50 MG tablet Take 50 mg by mouth every 8 (eight) hours.    . isosorbide mononitrate (IMDUR) 30 MG 24 hr tablet Take 30 mg by mouth daily.    Marland Kitchen lisinopril (PRINIVIL,ZESTRIL) 20 MG tablet Take 1 tablet by mouth daily.    . metolazone (ZAROXOLYN) 5 MG tablet Take 5 mg by mouth 2 (two) times daily as needed. Reported on 10/05/2015    . ondansetron (ZOFRAN ODT) 8 MG disintegrating tablet Take 1 tablet (8 mg total) by mouth every 8 (eight) hours as needed for nausea or vomiting. 20 tablet 0  .  pravastatin (PRAVACHOL) 80 MG tablet Take 80 mg by mouth daily.    . raloxifene (EVISTA) 60 MG tablet Take 60 mg by mouth daily.    . sodium bicarbonate 650 MG tablet Take 650 mg by mouth 3 (three) times daily.    Marland Kitchen torsemide (DEMADEX) 20 MG tablet Take 20 mg by mouth daily.    . Vitamin D, Ergocalciferol, (DRISDOL) 50000 UNITS CAPS Take 50,000 Units by mouth every 7 (seven) days. On Tuesday     No current facility-administered medications on file prior to visit.      PAST SURGICAL HISTORY Past Surgical History:  Procedure Laterality Date  . AV FISTULA PLACEMENT Left 07/22/2012   Procedure: INSERTION OF ARTERIOVENOUS GORE-TEX GRAFT ARM;  Surgeon: Angelia Mould, MD;  Location: [facility];  Service: Vascular;  Laterality: Left;  . CESAREAN SECTION     30+yrs ago  . COLONOSCOPY    . left arm surgery with pin     15+yrs ago    FAMILY HISTORY: Family History  Problem Relation Age of Onset  . Kidney disease Other   . Diabetes Mother     SOCIAL HISTORY:  reports that she has been smoking Cigarettes.  She started smoking about 48 years ago. She has a 40.00 pack-year smoking history. She has never used smokeless tobacco. She reports that she does not drink alcohol or use drugs.  Social History   Social History  . Marital status: Single    Spouse name: N/A  . Number of children: N/A  . Years of education: N/A   Social History Main Topics  . Smoking status: Current Every Day Smoker    Packs/day: 1.00    Years: 40.00    Types: Cigarettes    Start date: 05/14/1968  . Smokeless tobacco: Never Used  . Alcohol use No     Comment: nothing in 2-18yrs   . Drug use: No  . Sexual activity: Not Currently   Other Topics Concern  . Not on file   Social History Narrative  . No narrative on file    PERFORMANCE STATUS: The patient's performance status is 1 - Symptomatic but completely ambulatory   PHYSICAL EXAM: There were no vitals taken for this visit. Vitals with BMI 08/27/2016    Height   Weight   BMI   Systolic 616  Diastolic 52  Pulse 72  Respirations 16   Physical Exam  Constitutional: She is oriented to person, place, and time. She appears well-developed and well-nourished.  Eyes: Conjunctivae are normal. Pupils are equal, round, and reactive to light. No scleral icterus.  Neck: Neck supple.  Cardiovascular: Normal rate, regular rhythm and normal heart sounds.  Exam reveals no gallop and no friction rub.  No murmur heard. Pulmonary/Chest: Effort normal and breath sounds normal. No respiratory distress. She has no wheezes. She has no rales. She exhibits no tenderness.  Abdominal: Soft. Bowel sounds are normal. She exhibits no distension and no mass. There is no tenderness. There is no rebound and no guarding.  Neurological: She is alert and oriented to person, place, and time.  Skin: Skin is warm and dry.  Psychiatric: She has a normal mood and affect. Her behavior is normal. Judgment and thought content normal.    LABORATORY DATA:  I have personally reviewed the laboratory studied detailed below:  Results for Shannon, [name] (MRN [id number]) as of 08/27/2016 14:57  Ref. Range 08/27/2016 14:18  WBC Latest Ref Range: 4.0 - 10.5 K/uL 8.8  RBC Latest Ref Range: 3.87 - 5.11 MIL/uL 3.01 (L)  Hemoglobin Latest Ref Range: 12.0 - 15.0 g/dL 9.5 (L)  HCT Latest Ref Range: 36.0 - 46.0 % 29.4 (L)  MCV Latest Ref Range: 78.0 - 100.0 fL 97.7  MCH Latest Ref Range: 26.0 - 34.0 pg 31.6  MCHC Latest Ref Range: 30.0 - 36.0 g/dL 32.3  RDW Latest Ref Range: 11.5 - 15.5 % 14.0  Platelets Latest Ref Range: 150 - 400 K/uL 231  Neutrophils Latest Units: % 76  Lymphocytes Latest Units: % 19  Monocytes Relative Latest Units: % 2  Eosinophil Latest Units: % 3  Basophil Latest Units: % 0  NEUT# Latest Ref Range: 1.7 - 7.7 K/uL 6.7  Lymphocyte # Latest Ref Range: 0.7 - 4.0 K/uL 1.7  Monocyte # Latest Ref Range: 0.1 - 1.0 K/uL 0.2  Eosinophils Absolute Latest Ref Range:  0.0 - 0.7 K/uL 0.2  Basophils Absolute Latest Ref Range: 0.0 - 0.1 K/uL 0.0   RADIOGRAPHY: No results found.     PATHOLOGY:  N/A  ASSESSMENT/PLAN:  Absolute anemia Anemia secondary to stage IV CKD History of AVMs History of esophageal varices  PLAN: - Aranesp today.  - RTC in 3 months for follow up with CBC, CMP, and iron studies. She will return for labs every 2 weeks to assess for need for aranesp based on her hemoglobin..  - Goal is to keep ferritin greater than or equal to 100 ng/ml. Last ferritin 314 in March 2018.  All questions were answered. The patient knows to call the clinic with any problems, questions or concerns. We can certainly see the patient much sooner if necessary.  This document serves as a record of services personally performed by Twana First, MD. It was created on her behalf by Maryla Morrow, a trained medical scribe. The creation of this record is based on the scribe's personal observations and the provider's statements to them. This document has been checked and approved by the attending provider.  I have reviewed the above documentation for accuracy and completeness and I agree with the above.  This note is electronically signed by:  Twana First, MD  08/27/2016 2:58 PM

## 2016-08-27 NOTE — Patient Instructions (Signed)
Shannon [name] at [facility] Discharge Instructions  RECOMMENDATIONS MADE BY THE CONSULTANT AND ANY TEST RESULTS WILL BE SENT TO YOUR REFERRING PHYSICIAN.  Hemoglobin 9.5. Aranesp 80 mcg injection given as ordered. Return as scheduled.  Thank you for choosing [place] at [facility] to provide your oncology and hematology care.  To afford each patient quality time with our provider, please arrive at least 15 minutes before your scheduled appointment time.    If you have a lab appointment with the [facility] please come in thru the  Main Entrance and check in at the main information desk  You need to re-schedule your appointment should you arrive 10 or more minutes late.  We strive to give you quality time with our providers, and arriving late affects you and other patients whose appointments are after yours.  Also, if you no show three or more times for appointments you may be dismissed from the clinic at the providers discretion.     Again, thank you for choosing [facility].  Our hope is that these requests will decrease the amount of time that you wait before being seen by our physicians.       _____________________________________________________________  Should you have questions after your visit to [facility], please contact our office at [phone] between the hours of 8:30 a.m. and 4:30 p.m.  Voicemails left after 4:30 p.m. will not be returned until the following business day.  For prescription refill requests, have your pharmacy contact our office.       Resources For Cancer Patients and their Caregivers ? American Cancer Society: Can assist with transportation, wigs, general needs, runs Look Good Feel Better.        704-489-8909 ? Cancer Care: Provides financial assistance, online support groups, medication/co-pay assistance.  1-800-813-HOPE 7757890802) ? [place] Assists  [place] cancer patients and their families through emotional , educational and financial support.  671-860-6154 ? [place] DSS Where to apply for food stamps, Medicaid and utility assistance. (623) 395-3690 ? RCATS: Transportation to medical appointments. 670-312-2572 ? Social Security Administration: May apply for disability if have a Stage IV cancer. 763-785-0395 7093817292 ? [employer], Disability and Transit Services: Assists with nutrition, care and transit needs. [place] Support Programs: @10RELATIVEDAYS @ > Cancer Support Group  2nd Tuesday of the month 1pm-2pm, Journey Room  > Creative Journey  3rd Tuesday of the month 1130am-1pm, Journey Room  > Look Good Feel Better  1st Wednesday of the month 10am-12 noon, Journey Room (Call [place] to register [phone])

## 2016-08-27 NOTE — Patient Instructions (Signed)
[place] at [facility] Discharge Instructions  RECOMMENDATIONS MADE BY THE CONSULTANT AND ANY TEST RESULTS WILL BE SENT TO YOUR REFERRING PHYSICIAN.  You were seen today by Dr. Twana First You will get your injection today and will continue it monthly Follow up in 3 months with lab work See Amy up front for appointments   Thank you for choosing [place] at [facility] to provide your oncology and hematology care.  To afford each patient quality time with our provider, please arrive at least 15 minutes before your scheduled appointment time.    If you have a lab appointment with the [facility] please come in thru the  Main Entrance and check in at the main information desk  You need to re-schedule your appointment should you arrive 10 or more minutes late.  We strive to give you quality time with our providers, and arriving late affects you and other patients whose appointments are after yours.  Also, if you no show three or more times for appointments you may be dismissed from the clinic at the providers discretion.     Again, thank you for choosing [facility].  Our hope is that these requests will decrease the amount of time that you wait before being seen by our physicians.       _____________________________________________________________  Should you have questions after your visit to [facility], please contact our office at [phone] between the hours of 8:30 a.m. and 4:30 p.m.  Voicemails left after 4:30 p.m. will not be returned until the following business day.  For prescription refill requests, have your pharmacy contact our office.       Resources For Cancer Patients and their Caregivers ? American Cancer Society: Can assist with transportation, wigs, general needs, runs Look Good Feel Better.        ([phone] [phone] ? Cancer Care: Provides financial assistance, online support groups,  medication/co-pay assistance.  1-800-813-HOPE 773 816 4286) ? [place] Assists [place] cancer patients and their families through emotional , educational and financial support.  219-138-0285 ? [place] DSS Where to apply for food stamps, Medicaid and utility assistance. 437-156-8280 ? RCATS: Transportation to medical appointments. 7072823139 ? Social Security Administration: May apply for disability if have a Stage IV cancer. 215-643-1311 281-289-5158 ? [employer], Disability and Transit Services: Assists with nutrition, care and transit needs. [place] Support Programs: @10RELATIVEDAYS @ > Cancer Support Group  2nd Tuesday of the month 1pm-2pm, Journey Room  > Creative Journey  3rd Tuesday of the month 1130am-1pm, Journey Room  > Look Good Feel Better  1st Wednesday of the month 10am-12 noon, Journey Room (Call [place] to register [phone])

## 2016-09-24 ENCOUNTER — Encounter (HOSPITAL_COMMUNITY): Payer: Medicare Other

## 2016-09-24 ENCOUNTER — Encounter (HOSPITAL_COMMUNITY): Payer: Medicare Other | Attending: Oncology

## 2016-09-24 VITALS — BP 115/49 | HR 67 | Temp 98.1°F | Resp 18

## 2016-09-24 DIAGNOSIS — N189 Chronic kidney disease, unspecified: Secondary | ICD-10-CM | POA: Insufficient documentation

## 2016-09-24 DIAGNOSIS — D631 Anemia in chronic kidney disease: Secondary | ICD-10-CM | POA: Diagnosis not present

## 2016-09-24 DIAGNOSIS — N184 Chronic kidney disease, stage 4 (severe): Secondary | ICD-10-CM | POA: Diagnosis not present

## 2016-09-24 LAB — COMPREHENSIVE METABOLIC PANEL
ALK PHOS: 72 U/L (ref 38–126)
ALT: 14 U/L (ref 14–54)
ANION GAP: 8 (ref 5–15)
AST: 19 U/L (ref 15–41)
Albumin: 3.3 g/dL — ABNORMAL LOW (ref 3.5–5.0)
BUN: 41 mg/dL — ABNORMAL HIGH (ref 6–20)
CALCIUM: 8.4 mg/dL — AB (ref 8.9–10.3)
CO2: 21 mmol/L — AB (ref 22–32)
Chloride: 108 mmol/L (ref 101–111)
Creatinine, Ser: 3.71 mg/dL — ABNORMAL HIGH (ref 0.44–1.00)
GFR calc non Af Amer: 12 mL/min — ABNORMAL LOW (ref >60)
GFR, EST AFRICAN AMERICAN: 14 mL/min — AB (ref >60)
Glucose, Bld: 150 mg/dL — ABNORMAL HIGH (ref 65–99)
Potassium: 4.2 mmol/L (ref 3.5–5.1)
SODIUM: 137 mmol/L (ref 135–145)
TOTAL PROTEIN: 6.5 g/dL (ref 6.5–8.1)
Total Bilirubin: 0.6 mg/dL (ref 0.3–1.2)

## 2016-09-24 LAB — CBC WITH DIFFERENTIAL/PLATELET
Basophils Absolute: 0 10*3/uL (ref 0.0–0.1)
Basophils Relative: 0 %
EOS ABS: 0.3 10*3/uL (ref 0.0–0.7)
EOS PCT: 3 %
HCT: 29.9 % — ABNORMAL LOW (ref 36.0–46.0)
HEMOGLOBIN: 9.7 g/dL — AB (ref 12.0–15.0)
LYMPHS ABS: 1.4 10*3/uL (ref 0.7–4.0)
Lymphocytes Relative: 14 %
MCH: 32.1 pg (ref 26.0–34.0)
MCHC: 32.4 g/dL (ref 30.0–36.0)
MCV: 99 fL (ref 78.0–100.0)
MONOS PCT: 4 %
Monocytes Absolute: 0.4 10*3/uL (ref 0.1–1.0)
NEUTROS PCT: 79 %
Neutro Abs: 7.9 10*3/uL — ABNORMAL HIGH (ref 1.7–7.7)
Platelets: 225 10*3/uL (ref 150–400)
RBC: 3.02 MIL/uL — ABNORMAL LOW (ref 3.87–5.11)
RDW: 13.9 % (ref 11.5–15.5)
WBC: 10.1 10*3/uL (ref 4.0–10.5)

## 2016-09-24 LAB — FERRITIN: FERRITIN: 292 ng/mL (ref 11–307)

## 2016-09-24 MED ORDER — DARBEPOETIN ALFA 100 MCG/0.5ML IJ SOSY
PREFILLED_SYRINGE | INTRAMUSCULAR | Status: AC
  Filled 2016-09-24: qty 0.5

## 2016-09-24 MED ORDER — DARBEPOETIN ALFA 100 MCG/0.5ML IJ SOSY
80.0000 ug | PREFILLED_SYRINGE | Freq: Once | INTRAMUSCULAR | Status: AC
  Administered 2016-09-24: 80 ug via SUBCUTANEOUS

## 2016-09-24 NOTE — Progress Notes (Signed)
Shannon [name] presents today for injection per MD orders. Aranesp 80 mcg administered SQ in right Abdomen. Administration without incident. Patient tolerated well. Stable and ambulatory on discharge home to self.

## 2016-09-24 NOTE — Patient Instructions (Signed)
[place] at [facility] Discharge Instructions  RECOMMENDATIONS MADE BY THE CONSULTANT AND ANY TEST RESULTS WILL BE SENT TO YOUR REFERRING PHYSICIAN.  Hemoglobin 9.7. Aranesp 80 mcg injection given as ordered. Return as scheduled.  Thank you for choosing [place] at [facility] to provide your oncology and hematology care.  To afford each patient quality time with our provider, please arrive at least 15 minutes before your scheduled appointment time.    If you have a lab appointment with the [facility] [place] please come in thru the  Main Entrance and check in at the main information desk  You need to re-schedule your appointment should you arrive 10 or more minutes late.  We strive to give you quality time with our providers, and arriving late affects you and other patients whose appointments are after yours.  Also, if you no show three or more times for appointments you may be dismissed from the clinic at the providers discretion.     Again, thank you for choosing [facility].  Our hope is that these requests will decrease the amount of time that you wait before being seen by our physicians.       _____________________________________________________________  Should you have questions after your visit to [facility], please contact our office at [phone] between the hours of 8:30 a.m. and 4:30 p.m.  Voicemails left after 4:30 p.m. will not be returned until the following business day.  For prescription refill requests, have your pharmacy contact our office.       Resources For Cancer Patients and their Caregivers ? American Cancer Society: Can assist with transportation, wigs, general needs, runs Look Good Feel Better.        (815)122-4240 ? Cancer Care: Provides financial assistance, online support groups, medication/co-pay assistance.  1-800-813-HOPE 954-740-8477) ? [place] Assists  [place] cancer patients and their families through emotional , educational and financial support.  832-677-8965 ? [place] DSS Where to apply for food stamps, Medicaid and utility assistance. 951 740 6989 ? RCATS: Transportation to medical appointments. 216 640 2467 ? Social Security Administration: May apply for disability if have a Stage IV cancer. (416) 347-3545 425-874-7058 ? [employer], Disability and Transit Services: Assists with nutrition, care and transit needs. [place] Support Programs: @10RELATIVEDAYS @ > Cancer Support Group  2nd Tuesday of the month 1pm-2pm, Journey Room  > Creative Journey  3rd Tuesday of the month 1130am-1pm, Journey Room  > Look Good Feel Better  1st Wednesday of the month 10am-12 noon, Journey Room (Call [place] to register [phone])

## 2016-10-22 ENCOUNTER — Ambulatory Visit (HOSPITAL_COMMUNITY): Payer: Medicare Other

## 2016-10-22 ENCOUNTER — Other Ambulatory Visit (HOSPITAL_COMMUNITY): Payer: Medicare Other

## 2016-10-29 ENCOUNTER — Encounter (HOSPITAL_BASED_OUTPATIENT_CLINIC_OR_DEPARTMENT_OTHER): Payer: Medicare Other

## 2016-10-29 ENCOUNTER — Encounter (HOSPITAL_COMMUNITY): Payer: Self-pay

## 2016-10-29 ENCOUNTER — Encounter (HOSPITAL_COMMUNITY): Payer: Medicare Other | Attending: Oncology

## 2016-10-29 VITALS — BP 136/67 | HR 67 | Temp 98.1°F | Resp 18

## 2016-10-29 DIAGNOSIS — N184 Chronic kidney disease, stage 4 (severe): Secondary | ICD-10-CM

## 2016-10-29 DIAGNOSIS — D631 Anemia in chronic kidney disease: Secondary | ICD-10-CM | POA: Diagnosis not present

## 2016-10-29 DIAGNOSIS — N183 Chronic kidney disease, stage 3 (moderate): Secondary | ICD-10-CM | POA: Diagnosis not present

## 2016-10-29 DIAGNOSIS — N189 Chronic kidney disease, unspecified: Secondary | ICD-10-CM

## 2016-10-29 LAB — CBC WITH DIFFERENTIAL/PLATELET
Basophils Absolute: 0 10*3/uL (ref 0.0–0.1)
Basophils Relative: 0 %
EOS ABS: 0.2 10*3/uL (ref 0.0–0.7)
EOS PCT: 2 %
HCT: 29.9 % — ABNORMAL LOW (ref 36.0–46.0)
Hemoglobin: 9.7 g/dL — ABNORMAL LOW (ref 12.0–15.0)
LYMPHS ABS: 1.3 10*3/uL (ref 0.7–4.0)
Lymphocytes Relative: 16 %
MCH: 32 pg (ref 26.0–34.0)
MCHC: 32.4 g/dL (ref 30.0–36.0)
MCV: 98.7 fL (ref 78.0–100.0)
MONOS PCT: 4 %
Monocytes Absolute: 0.3 10*3/uL (ref 0.1–1.0)
Neutro Abs: 6 10*3/uL (ref 1.7–7.7)
Neutrophils Relative %: 78 %
PLATELETS: 222 10*3/uL (ref 150–400)
RBC: 3.03 MIL/uL — ABNORMAL LOW (ref 3.87–5.11)
RDW: 13.7 % (ref 11.5–15.5)
WBC: 7.8 10*3/uL (ref 4.0–10.5)

## 2016-10-29 LAB — COMPREHENSIVE METABOLIC PANEL WITH GFR
ALT: 13 U/L — ABNORMAL LOW (ref 14–54)
AST: 16 U/L (ref 15–41)
Albumin: 3.3 g/dL — ABNORMAL LOW (ref 3.5–5.0)
Alkaline Phosphatase: 67 U/L (ref 38–126)
Anion gap: 9 (ref 5–15)
BUN: 34 mg/dL — ABNORMAL HIGH (ref 6–20)
CO2: 21 mmol/L — ABNORMAL LOW (ref 22–32)
Calcium: 8.5 mg/dL — ABNORMAL LOW (ref 8.9–10.3)
Chloride: 112 mmol/L — ABNORMAL HIGH (ref 101–111)
Creatinine, Ser: 3.82 mg/dL — ABNORMAL HIGH (ref 0.44–1.00)
GFR calc Af Amer: 14 mL/min — ABNORMAL LOW
GFR calc non Af Amer: 12 mL/min — ABNORMAL LOW
Glucose, Bld: 104 mg/dL — ABNORMAL HIGH (ref 65–99)
Potassium: 4.3 mmol/L (ref 3.5–5.1)
Sodium: 142 mmol/L (ref 135–145)
Total Bilirubin: 0.5 mg/dL (ref 0.3–1.2)
Total Protein: 6.7 g/dL (ref 6.5–8.1)

## 2016-10-29 LAB — FERRITIN: Ferritin: 323 ng/mL — ABNORMAL HIGH (ref 11–307)

## 2016-10-29 MED ORDER — DARBEPOETIN ALFA 100 MCG/0.5ML IJ SOSY
PREFILLED_SYRINGE | INTRAMUSCULAR | Status: AC
  Filled 2016-10-29: qty 0.5

## 2016-10-29 MED ORDER — DARBEPOETIN ALFA 100 MCG/0.5ML IJ SOSY
80.0000 ug | PREFILLED_SYRINGE | Freq: Once | INTRAMUSCULAR | Status: AC
  Administered 2016-10-29: 80 ug via SUBCUTANEOUS

## 2016-10-29 NOTE — Patient Instructions (Signed)
[facility] at [facility] Discharge Instructions  RECOMMENDATIONS MADE BY THE CONSULTANT AND ANY TEST RESULTS WILL BE SENT TO YOUR REFERRING PHYSICIAN.  Aranesp given Follow up as scheduled  Thank you for choosing [facility] at [facility] to provide your oncology and hematology care.  To afford each patient quality time with our provider, please arrive at least 15 minutes before your scheduled appointment time.    If you have a lab appointment with the Cancer Center please come in thru the  Main Entrance and check in at the main information desk  You need to re-schedule your appointment should you arrive 10 or more minutes late.  We strive to give you quality time with our providers, and arriving late affects you and other patients whose appointments are after yours.  Also, if you no show three or more times for appointments you may be dismissed from the clinic at the providers discretion.     Again, thank you for choosing [facility].  Our hope is that these requests will decrease the amount of time that you wait before being seen by our physicians.       _____________________________________________________________  Should you have questions after your visit to [facility], please contact our office at [phone] between the hours of 8:30 a.m. and 4:30 p.m.  Voicemails left after 4:30 p.m. will not be returned until the following business day.  For prescription refill requests, have your pharmacy contact our office.       Resources For Cancer Patients and their Caregivers ? American Cancer Society: Can assist with transportation, wigs, general needs, runs Look Good Feel Better.        1-888-227-6333 ? Cancer Care: Provides financial assistance, online support groups, medication/co-pay assistance.  1-800-813-HOPE (4673) ? [facility] Assists [place] cancer patients and their  families through emotional , educational and financial support.  336-427-4357 ? [place] DSS Where to apply for food stamps, Medicaid and utility assistance. 336-342-1394 ? RCATS: Transportation to medical appointments. 336-347-2287 ? Social Security Administration: May apply for disability if have a Stage IV cancer. 336-342-7796 1-800-772-1213 ? [place] Aging, Disability and Transit Services: Assists with nutrition, care and transit needs. 336-349-2343  Cancer Center Support Programs: @10RELATIVEDAYS@ > Cancer Support Group  2nd Tuesday of the month 1pm-2pm, Journey Room  > Creative Journey  3rd Tuesday of the month 1130am-1pm, Journey Room  > Look Good Feel Better  1st Wednesday of the month 10am-12 noon, Journey Room (Call [employer] to register [phone])   

## 2016-10-29 NOTE — Progress Notes (Signed)
Shannon [name] presents today for injection per MD orders. Aranesp 13mcg administered SQ in right Abdomen. Administration without incident. Patient tolerated well. Vitals stable and discharged home ambulatory from clinic. Follow up as scheduled.

## 2016-11-19 ENCOUNTER — Ambulatory Visit (HOSPITAL_COMMUNITY): Payer: Medicare Other

## 2016-11-19 ENCOUNTER — Other Ambulatory Visit (HOSPITAL_COMMUNITY): Payer: Medicare Other

## 2016-11-26 ENCOUNTER — Encounter (HOSPITAL_BASED_OUTPATIENT_CLINIC_OR_DEPARTMENT_OTHER): Payer: Medicare Other

## 2016-11-26 ENCOUNTER — Encounter (HOSPITAL_COMMUNITY): Payer: Self-pay | Admitting: Oncology

## 2016-11-26 ENCOUNTER — Encounter (HOSPITAL_COMMUNITY): Payer: Medicare Other | Attending: Oncology

## 2016-11-26 ENCOUNTER — Encounter (HOSPITAL_BASED_OUTPATIENT_CLINIC_OR_DEPARTMENT_OTHER): Payer: Medicare Other | Admitting: Oncology

## 2016-11-26 VITALS — BP 109/56 | HR 63 | Temp 98.0°F | Resp 16 | Wt 238.7 lb

## 2016-11-26 DIAGNOSIS — N189 Chronic kidney disease, unspecified: Secondary | ICD-10-CM

## 2016-11-26 DIAGNOSIS — N184 Chronic kidney disease, stage 4 (severe): Secondary | ICD-10-CM

## 2016-11-26 DIAGNOSIS — D631 Anemia in chronic kidney disease: Secondary | ICD-10-CM | POA: Diagnosis not present

## 2016-11-26 DIAGNOSIS — D649 Anemia, unspecified: Secondary | ICD-10-CM

## 2016-11-26 LAB — COMPREHENSIVE METABOLIC PANEL
ALK PHOS: 72 U/L (ref 38–126)
ALT: 13 U/L — ABNORMAL LOW (ref 14–54)
AST: 15 U/L (ref 15–41)
Albumin: 3.2 g/dL — ABNORMAL LOW (ref 3.5–5.0)
Anion gap: 7 (ref 5–15)
BILIRUBIN TOTAL: 0.5 mg/dL (ref 0.3–1.2)
BUN: 43 mg/dL — ABNORMAL HIGH (ref 6–20)
CALCIUM: 8.5 mg/dL — AB (ref 8.9–10.3)
CO2: 22 mmol/L (ref 22–32)
Chloride: 112 mmol/L — ABNORMAL HIGH (ref 101–111)
Creatinine, Ser: 4.1 mg/dL — ABNORMAL HIGH (ref 0.44–1.00)
GFR, EST AFRICAN AMERICAN: 13 mL/min — AB (ref >60)
GFR, EST NON AFRICAN AMERICAN: 11 mL/min — AB (ref >60)
GLUCOSE: 141 mg/dL — AB (ref 65–99)
Potassium: 4.4 mmol/L (ref 3.5–5.1)
Sodium: 141 mmol/L (ref 135–145)
TOTAL PROTEIN: 6.6 g/dL (ref 6.5–8.1)

## 2016-11-26 LAB — CBC WITH DIFFERENTIAL/PLATELET
Basophils Absolute: 0 10*3/uL (ref 0.0–0.1)
Basophils Relative: 0 %
Eosinophils Absolute: 0.3 10*3/uL (ref 0.0–0.7)
Eosinophils Relative: 4 %
HEMATOCRIT: 28.9 % — AB (ref 36.0–46.0)
HEMOGLOBIN: 9.3 g/dL — AB (ref 12.0–15.0)
LYMPHS ABS: 1.4 10*3/uL (ref 0.7–4.0)
Lymphocytes Relative: 19 %
MCH: 31.4 pg (ref 26.0–34.0)
MCHC: 32.2 g/dL (ref 30.0–36.0)
MCV: 97.6 fL (ref 78.0–100.0)
MONO ABS: 0.3 10*3/uL (ref 0.1–1.0)
MONOS PCT: 4 %
NEUTROS ABS: 5.5 10*3/uL (ref 1.7–7.7)
NEUTROS PCT: 73 %
Platelets: 234 10*3/uL (ref 150–400)
RBC: 2.96 MIL/uL — ABNORMAL LOW (ref 3.87–5.11)
RDW: 13.8 % (ref 11.5–15.5)
WBC: 7.5 10*3/uL (ref 4.0–10.5)

## 2016-11-26 LAB — IRON AND TIBC
IRON: 44 ug/dL (ref 28–170)
SATURATION RATIOS: 21 % (ref 10.4–31.8)
TIBC: 214 ug/dL — AB (ref 250–450)
UIBC: 170 ug/dL

## 2016-11-26 LAB — FERRITIN: Ferritin: 277 ng/mL (ref 11–307)

## 2016-11-26 MED ORDER — DARBEPOETIN ALFA 150 MCG/0.3ML IJ SOSY
PREFILLED_SYRINGE | INTRAMUSCULAR | Status: AC
  Filled 2016-11-26: qty 0.3

## 2016-11-26 MED ORDER — DARBEPOETIN ALFA 150 MCG/0.3ML IJ SOSY
150.0000 ug | PREFILLED_SYRINGE | Freq: Once | INTRAMUSCULAR | Status: AC
  Administered 2016-11-26: 150 ug via SUBCUTANEOUS

## 2016-11-26 NOTE — Progress Notes (Signed)
Shannon [name] presents today for injection per the provider's orders.  Aranesp administration without incident; see MAR for injection details.  Patient tolerated procedure well and without incident.  No questions or complaints noted at this time.  Discharged ambulatory.

## 2016-11-26 NOTE — Progress Notes (Signed)
[facility] Hematology/Oncology Consultation   Name: Shannon [name]      MRN: [id number]     Date: 11/26/2016 Time:11:47 AM   REFERRING PHYSICIAN:  Audree Camel, MD (Medical Oncology)  REASON FOR CONSULT:  Transfer of hematologic care   DIAGNOSIS:  Anemia of chronic renal disease, Stage IV  HISTORY OF PRESENT ILLNESS:   Shannon [name] is a 61 y.o. female with a medical history significant for CHF, MI, HTN, asthma, obesity, AVMs and esophageal varices who is referred to the [facility] for anemia of chronic renal disease.  Today patient presents for continued follow up. She has no complaints. Her hemoglobin has been in the 9 g/dl range in the past few months, and is 9.3 g/dl today. She is currently on aranesp 80 mcg qmonthly and is tolerating it well.  Review of Systems  Constitutional: Negative for malaise/fatigue.  HENT: Negative.   Eyes: Negative.   Respiratory: Negative.  Negative for shortness of breath.   Cardiovascular: Negative.  Negative for chest pain.  Gastrointestinal: Negative.  Negative for abdominal pain.  Genitourinary: Negative.   Musculoskeletal: Negative.   Skin: Negative.   Neurological: Negative.   Endo/Heme/Allergies: Negative.   Psychiatric/Behavioral: Negative.   All other systems reviewed and are negative. 14 point review of systems was performed and is negative except as detailed under history of present illness and above   PAST MEDICAL HISTORY:   Past Medical History:  Diagnosis Date  . Absolute anemia 11/10/2015  . Anemia   . Anemia of renal disease 11/10/2015  . Asthma    has Albuterol inhaler prn  . CHF (congestive heart failure) ([place])    last echo showed EF 20%  . Chronic kidney disease    Membranous nephropathy ( Seen at [facility])  . Gastric ulcer   . History of blood transfusion    no abnormal reaction noted  . History of bronchitis    last time 3-54yrs ago  . Hyperlipidemia    takes Pravastatin  daily  . Hypertension    takes Imdur and Hydralazine daily as well as Coreg  . Hypothyroidism    takes Synthroid daily  . Itching    on legs-has a Kenalog cream  . Metabolic bone disease   . Nausea    takes Zofran prn  . Obesity   . Peripheral edema    takes Torsemide daily  . Thyroid disease    hypothyroidism    ALLERGIES: Allergies  Allergen Reactions  . Corticosteroids Hives, Itching and Nausea And Vomiting  . Rituximab     wheezing      MEDICATIONS: I have reviewed the patient's current medications.    Current Outpatient Prescriptions on File Prior to Visit  Medication Sig Dispense Refill  . albuterol (PROVENTIL HFA;VENTOLIN HFA) 108 (90 BASE) MCG/ACT inhaler Inhale into the lungs every 6 (six) hours as needed for wheezing.    . carvedilol (COREG) 25 MG tablet Take 25 mg by mouth daily.     . clobetasol cream (TEMOVATE) 9.92 % Apply 1 application topically 2 (two) times daily.    . diphenoxylate-atropine (LOMOTIL) 2.5-0.025 MG tablet Take 1 tablet by mouth 4 (four) times daily as needed for diarrhea or loose stools. 15 tablet 0  . hydrALAZINE (APRESOLINE) 50 MG tablet Take 50 mg by mouth every 8 (eight) hours.    . isosorbide mononitrate (IMDUR) 30 MG 24 hr tablet Take 30 mg by mouth daily.    Marland Kitchen  lisinopril (PRINIVIL,ZESTRIL) 20 MG tablet Take 1 tablet by mouth daily.    . metolazone (ZAROXOLYN) 5 MG tablet Take 5 mg by mouth 2 (two) times daily as needed. Reported on 10/05/2015    . ondansetron (ZOFRAN ODT) 8 MG disintegrating tablet Take 1 tablet (8 mg total) by mouth every 8 (eight) hours as needed for nausea or vomiting. 20 tablet 0  . pravastatin (PRAVACHOL) 80 MG tablet Take 80 mg by mouth daily.    . raloxifene (EVISTA) 60 MG tablet Take 60 mg by mouth daily.    . sodium bicarbonate 650 MG tablet Take 650 mg by mouth 3 (three) times daily.    Marland Kitchen torsemide (DEMADEX) 20 MG tablet Take 20 mg by mouth daily.    . Vitamin D, Ergocalciferol, (DRISDOL) 50000 UNITS CAPS Take  50,000 Units by mouth every 7 (seven) days. On Tuesday     Current Facility-Administered Medications on File Prior to Visit  Medication Dose Route Frequency Provider Last Rate Last Dose  . Darbepoetin Alfa (ARANESP) injection 150 mcg  150 mcg Subcutaneous Once Twana First, MD         PAST SURGICAL HISTORY Past Surgical History:  Procedure Laterality Date  . AV FISTULA PLACEMENT Left 07/22/2012   Procedure: INSERTION OF ARTERIOVENOUS GORE-TEX GRAFT ARM;  Surgeon: Angelia Mould, MD;  Location: [place];  Service: Vascular;  Laterality: Left;  . CESAREAN SECTION     30+yrs ago  . COLONOSCOPY    . left arm surgery with pin     15+yrs ago    FAMILY HISTORY: Family History  Problem Relation Age of Onset  . Kidney disease Other   . Diabetes Mother     SOCIAL HISTORY:  reports that she has been smoking Cigarettes.  She started smoking about 48 years ago. She has a 40.00 pack-year smoking history. She has never used smokeless tobacco. She reports that she does not drink alcohol or use drugs.  Social History   Social History  . Marital status: Single    Spouse name: N/A  . Number of children: N/A  . Years of education: N/A   Social History Main Topics  . Smoking status: Current Every Day Smoker    Packs/day: 1.00    Years: 40.00    Types: Cigarettes    Start date: 05/14/1968  . Smokeless tobacco: Never Used  . Alcohol use No     Comment: nothing in 2-61yrs   . Drug use: No  . Sexual activity: Not Currently   Other Topics Concern  . None   Social History Narrative  . None    PERFORMANCE STATUS: The patient's performance status is 1 - Symptomatic but completely ambulatory   PHYSICAL EXAM: Blood pressure (!) 109/56, pulse 63, temperature 98 F (36.7 C), resp. rate 16, weight 238 lb 11.2 oz (108.3 kg), SpO2 100 %. Vitals with BMI 08/27/2016  Height   Weight   BMI   Systolic 831  Diastolic 52  Pulse 72  Respirations 16   Physical Exam  Constitutional: She is  oriented to person, place, and time. She appears well-developed and well-nourished.  Eyes: Pupils are equal, round, and reactive to light. Conjunctivae are normal. No scleral icterus.  Neck: Neck supple.  Cardiovascular: Normal rate, regular rhythm and normal heart sounds.  Exam reveals no gallop and no friction rub.   No murmur heard. Pulmonary/Chest: Effort normal and breath sounds normal. No respiratory distress. She has no wheezes. She has no rales. She exhibits  no tenderness.  Abdominal: Soft. Bowel sounds are normal. She exhibits no distension and no mass. There is no tenderness. There is no rebound and no guarding.  Neurological: She is alert and oriented to person, place, and time.  Skin: Skin is warm and dry.  Psychiatric: She has a normal mood and affect. Her behavior is normal. Judgment and thought content normal.    LABORATORY DATA:  I have personally reviewed the laboratory studied detailed below: Results for ONITA, PFLUGER (MRN [id number]) as of 11/26/2016 11:35  Ref. Range 11/26/2016 10:55  Sodium Latest Ref Range: 135 - 145 mmol/L 141  Potassium Latest Ref Range: 3.5 - 5.1 mmol/L 4.4  Chloride Latest Ref Range: 101 - 111 mmol/L 112 (H)  CO2 Latest Ref Range: 22 - 32 mmol/L 22  Glucose Latest Ref Range: 65 - 99 mg/dL 141 (H)  BUN Latest Ref Range: 6 - 20 mg/dL 43 (H)  Creatinine Latest Ref Range: 0.44 - 1.00 mg/dL 4.10 (H)  Calcium Latest Ref Range: 8.9 - 10.3 mg/dL 8.5 (L)  Anion gap Latest Ref Range: 5 - 15  7  Alkaline Phosphatase Latest Ref Range: 38 - 126 U/L 72  Albumin Latest Ref Range: 3.5 - 5.0 g/dL 3.2 (L)  AST Latest Ref Range: 15 - 41 U/L 15  ALT Latest Ref Range: 14 - 54 U/L 13 (L)  Total Protein Latest Ref Range: 6.5 - 8.1 g/dL 6.6  Total Bilirubin Latest Ref Range: 0.3 - 1.2 mg/dL 0.5  GFR, Est African American Latest Ref Range: >60 mL/min 13 (L)  GFR, Est Non African American Latest Ref Range: >60 mL/min 11 (L)  WBC Latest Ref Range: 4.0 - 10.5 K/uL  7.5  RBC Latest Ref Range: 3.87 - 5.11 MIL/uL 2.96 (L)  Hemoglobin Latest Ref Range: 12.0 - 15.0 g/dL 9.3 (L)  HCT Latest Ref Range: 36.0 - 46.0 % 28.9 (L)  MCV Latest Ref Range: 78.0 - 100.0 fL 97.6  MCH Latest Ref Range: 26.0 - 34.0 pg 31.4  MCHC Latest Ref Range: 30.0 - 36.0 g/dL 32.2  RDW Latest Ref Range: 11.5 - 15.5 % 13.8  Platelets Latest Ref Range: 150 - 400 K/uL 234      PATHOLOGY:  N/A  ASSESSMENT/PLAN:   Anemia secondary to stage IV CKD History of AVMs History of esophageal varices  PLAN: - Aranesp today. Will change the dose of her aranesp up to 150 mcg q monthly so that we can get her as close to a hemoglobin of 10g/dl as possible. She states her nephrologist does not want her hemoglobin higher than 10 due to issues with hypertension. - RTC in 3 months for follow up with CBC, CMP, and iron studies.   All questions were answered. The patient knows to call the clinic with any problems, questions or concerns. We can certainly see the patient much sooner if necessary.  This note is electronically signed by:  Twana First, MD  11/26/2016 11:47 AM

## 2016-12-21 ENCOUNTER — Other Ambulatory Visit (HOSPITAL_COMMUNITY): Payer: Self-pay | Admitting: *Deleted

## 2016-12-21 DIAGNOSIS — D508 Other iron deficiency anemias: Secondary | ICD-10-CM

## 2016-12-24 ENCOUNTER — Encounter (HOSPITAL_COMMUNITY): Payer: Self-pay

## 2016-12-24 ENCOUNTER — Encounter (HOSPITAL_COMMUNITY): Payer: Medicare Other | Attending: Oncology

## 2016-12-24 ENCOUNTER — Encounter (HOSPITAL_COMMUNITY): Payer: Medicare Other

## 2016-12-24 DIAGNOSIS — D649 Anemia, unspecified: Secondary | ICD-10-CM | POA: Insufficient documentation

## 2016-12-24 DIAGNOSIS — N189 Chronic kidney disease, unspecified: Secondary | ICD-10-CM

## 2016-12-24 DIAGNOSIS — D508 Other iron deficiency anemias: Secondary | ICD-10-CM

## 2016-12-24 DIAGNOSIS — D631 Anemia in chronic kidney disease: Secondary | ICD-10-CM

## 2016-12-24 LAB — CBC WITH DIFFERENTIAL/PLATELET
BASOS PCT: 0 %
Basophils Absolute: 0 10*3/uL (ref 0.0–0.1)
EOS ABS: 0.2 10*3/uL (ref 0.0–0.7)
Eosinophils Relative: 3 %
HEMATOCRIT: 30.8 % — AB (ref 36.0–46.0)
Hemoglobin: 10.1 g/dL — ABNORMAL LOW (ref 12.0–15.0)
LYMPHS ABS: 1.9 10*3/uL (ref 0.7–4.0)
Lymphocytes Relative: 21 %
MCH: 31.8 pg (ref 26.0–34.0)
MCHC: 32.8 g/dL (ref 30.0–36.0)
MCV: 96.9 fL (ref 78.0–100.0)
MONO ABS: 0.3 10*3/uL (ref 0.1–1.0)
MONOS PCT: 3 %
Neutro Abs: 6.3 10*3/uL (ref 1.7–7.7)
Neutrophils Relative %: 73 %
Platelets: 262 10*3/uL (ref 150–400)
RBC: 3.18 MIL/uL — ABNORMAL LOW (ref 3.87–5.11)
RDW: 13.9 % (ref 11.5–15.5)
WBC: 8.7 10*3/uL (ref 4.0–10.5)

## 2016-12-24 LAB — FERRITIN: Ferritin: 258 ng/mL (ref 11–307)

## 2016-12-24 LAB — COMPREHENSIVE METABOLIC PANEL
ALBUMIN: 3.6 g/dL (ref 3.5–5.0)
ALT: 14 U/L (ref 14–54)
ANION GAP: 10 (ref 5–15)
AST: 18 U/L (ref 15–41)
Alkaline Phosphatase: 74 U/L (ref 38–126)
BILIRUBIN TOTAL: 0.4 mg/dL (ref 0.3–1.2)
BUN: 44 mg/dL — ABNORMAL HIGH (ref 6–20)
CO2: 20 mmol/L — ABNORMAL LOW (ref 22–32)
Calcium: 9.1 mg/dL (ref 8.9–10.3)
Chloride: 109 mmol/L (ref 101–111)
Creatinine, Ser: 4.58 mg/dL — ABNORMAL HIGH (ref 0.44–1.00)
GFR calc non Af Amer: 9 mL/min — ABNORMAL LOW (ref >60)
GFR, EST AFRICAN AMERICAN: 11 mL/min — AB (ref >60)
GLUCOSE: 91 mg/dL (ref 65–99)
POTASSIUM: 4.2 mmol/L (ref 3.5–5.1)
Sodium: 139 mmol/L (ref 135–145)
Total Protein: 7.1 g/dL (ref 6.5–8.1)

## 2016-12-24 MED ORDER — DARBEPOETIN ALFA 150 MCG/0.3ML IJ SOSY
150.0000 ug | PREFILLED_SYRINGE | Freq: Once | INTRAMUSCULAR | Status: DC
  Filled 2016-12-24: qty 0.3

## 2016-12-24 NOTE — Progress Notes (Deleted)
[name] presents today for injection per the provider's orders.  Aranesp administration without incident; see MAR for injection details.  Patient tolerated procedure well and without incident.  No questions or complaints noted at this time.  Discharged ambulatory.

## 2016-12-24 NOTE — Progress Notes (Signed)
Aranesp injection held today d/t hgb of 10.1, as is according to her supportive therapy plan.

## 2017-01-21 ENCOUNTER — Encounter (HOSPITAL_COMMUNITY): Payer: Medicare Other

## 2017-01-21 ENCOUNTER — Encounter (HOSPITAL_COMMUNITY): Payer: Self-pay

## 2017-01-21 ENCOUNTER — Encounter (HOSPITAL_COMMUNITY): Payer: Medicare Other | Attending: Oncology

## 2017-01-21 VITALS — BP 124/62 | HR 61 | Temp 98.1°F | Resp 18

## 2017-01-21 DIAGNOSIS — D631 Anemia in chronic kidney disease: Secondary | ICD-10-CM

## 2017-01-21 DIAGNOSIS — N189 Chronic kidney disease, unspecified: Secondary | ICD-10-CM

## 2017-01-21 DIAGNOSIS — N184 Chronic kidney disease, stage 4 (severe): Secondary | ICD-10-CM

## 2017-01-21 DIAGNOSIS — D508 Other iron deficiency anemias: Secondary | ICD-10-CM | POA: Insufficient documentation

## 2017-01-21 LAB — COMPREHENSIVE METABOLIC PANEL
ALT: 21 U/L (ref 14–54)
ANION GAP: 7 (ref 5–15)
AST: 18 U/L (ref 15–41)
Albumin: 3.4 g/dL — ABNORMAL LOW (ref 3.5–5.0)
Alkaline Phosphatase: 69 U/L (ref 38–126)
BUN: 50 mg/dL — ABNORMAL HIGH (ref 6–20)
CALCIUM: 8.5 mg/dL — AB (ref 8.9–10.3)
CO2: 22 mmol/L (ref 22–32)
CREATININE: 3.95 mg/dL — AB (ref 0.44–1.00)
Chloride: 110 mmol/L (ref 101–111)
GFR, EST AFRICAN AMERICAN: 13 mL/min — AB (ref >60)
GFR, EST NON AFRICAN AMERICAN: 11 mL/min — AB (ref >60)
Glucose, Bld: 95 mg/dL (ref 65–99)
POTASSIUM: 5.1 mmol/L (ref 3.5–5.1)
Sodium: 139 mmol/L (ref 135–145)
TOTAL PROTEIN: 6.9 g/dL (ref 6.5–8.1)
Total Bilirubin: 0.4 mg/dL (ref 0.3–1.2)

## 2017-01-21 LAB — CBC WITH DIFFERENTIAL/PLATELET
Basophils Absolute: 0 10*3/uL (ref 0.0–0.1)
Basophils Relative: 0 %
EOS PCT: 1 %
Eosinophils Absolute: 0.2 10*3/uL (ref 0.0–0.7)
HCT: 29.4 % — ABNORMAL LOW (ref 36.0–46.0)
Hemoglobin: 9.6 g/dL — ABNORMAL LOW (ref 12.0–15.0)
LYMPHS ABS: 1.5 10*3/uL (ref 0.7–4.0)
LYMPHS PCT: 14 %
MCH: 31.9 pg (ref 26.0–34.0)
MCHC: 32.7 g/dL (ref 30.0–36.0)
MCV: 97.7 fL (ref 78.0–100.0)
MONO ABS: 0.5 10*3/uL (ref 0.1–1.0)
MONOS PCT: 4 %
NEUTROS ABS: 8.3 10*3/uL — AB (ref 1.7–7.7)
Neutrophils Relative %: 81 %
Platelets: 195 10*3/uL (ref 150–400)
RBC: 3.01 MIL/uL — ABNORMAL LOW (ref 3.87–5.11)
RDW: 14.4 % (ref 11.5–15.5)
WBC: 10.4 10*3/uL (ref 4.0–10.5)

## 2017-01-21 MED ORDER — DARBEPOETIN ALFA 150 MCG/0.3ML IJ SOSY
PREFILLED_SYRINGE | INTRAMUSCULAR | Status: AC
  Filled 2017-01-21: qty 0.3

## 2017-01-21 MED ORDER — DARBEPOETIN ALFA 150 MCG/0.3ML IJ SOSY
150.0000 ug | PREFILLED_SYRINGE | Freq: Once | INTRAMUSCULAR | Status: AC
  Administered 2017-01-21: 150 ug via SUBCUTANEOUS

## 2017-01-21 NOTE — Patient Instructions (Signed)
[place] at [facility] Discharge Instructions  RECOMMENDATIONS MADE BY THE CONSULTANT AND ANY TEST RESULTS WILL BE SENT TO YOUR REFERRING PHYSICIAN.  You received your Aranesp injection today. Your Hgb was 9.4. Follow up in 28 days for next injection.  Thank you for choosing [place] at [facility] to provide your oncology and hematology care.  To afford each patient quality time with our provider, please arrive at least 15 minutes before your scheduled appointment time.    If you have a lab appointment with the [place] please come in thru the  Main Entrance and check in at the main information desk  You need to re-schedule your appointment should you arrive 10 or more minutes late.  We strive to give you quality time with our providers, and arriving late affects you and other patients whose appointments are after yours.  Also, if you no show three or more times for appointments you may be dismissed from the clinic at the providers discretion.     Again, thank you for choosing [facility].  Our hope is that these requests will decrease the amount of time that you wait before being seen by our physicians.       _____________________________________________________________  Should you have questions after your visit to [facility], please contact our office at [phone] between the hours of 8:30 a.m. and 4:30 p.m.  Voicemails left after 4:30 p.m. will not be returned until the following business day.  For prescription refill requests, have your pharmacy contact our office.       Resources For Cancer Patients and their Caregivers ? American Cancer Society: Can assist with transportation, wigs, general needs, runs Look Good Feel Better.        931-068-0990 ? Cancer Care: Provides financial assistance, online support groups, medication/co-pay assistance.  1-800-813-HOPE (830) 670-0337) ? [place] Assists [place] cancer patients and their families through emotional , educational and financial support.  276-313-3318 ? [place] DSS Where to apply for food stamps, Medicaid and utility assistance. 443 470 0488 ? RCATS: Transportation to medical appointments. 346-759-6773 ? Social Security Administration: May apply for disability if have a Stage IV cancer. ([phone] 873-7996 3095404129 ? [employer], Disability and Transit Services: Assists with nutrition, care and transit needs. [place] Support Programs: @10RELATIVEDAYS @ > Cancer Support Group  2nd Tuesday of the month 1pm-2pm, Journey Room  > Creative Journey  3rd Tuesday of the month 1130am-1pm, Journey Room  > Look Good Feel Better  1st Wednesday of the month 10am-12 noon, Journey Room (Call [place] to register [phone])

## 2017-01-21 NOTE — Progress Notes (Signed)
Shannon [name] presents today for injection per MD orders. Aranesp 150 mcg administered SQ in right lower abdomen. Administration without incident. Patient tolerated well. Patient tolerated treatment without incidence. Patient discharged ambulatory and in stable condition from clinic to self. Patient to follow up as scheduled.

## 2017-01-22 LAB — FERRITIN: FERRITIN: 331 ng/mL — AB (ref 11–307)

## 2017-01-23 MED ORDER — FULVESTRANT 250 MG/5ML IM SOLN
INTRAMUSCULAR | Status: AC
  Filled 2017-01-23: qty 10

## 2017-02-18 ENCOUNTER — Ambulatory Visit (HOSPITAL_COMMUNITY): Payer: Medicare Other

## 2017-02-18 ENCOUNTER — Encounter (HOSPITAL_COMMUNITY): Payer: Medicare Other | Attending: Oncology | Admitting: Oncology

## 2017-02-18 ENCOUNTER — Encounter (HOSPITAL_COMMUNITY): Payer: Medicare Other

## 2017-02-18 ENCOUNTER — Other Ambulatory Visit (HOSPITAL_COMMUNITY): Payer: Medicare Other

## 2017-02-18 ENCOUNTER — Encounter (HOSPITAL_COMMUNITY): Payer: Self-pay

## 2017-02-18 ENCOUNTER — Encounter (HOSPITAL_BASED_OUTPATIENT_CLINIC_OR_DEPARTMENT_OTHER): Payer: Medicare Other

## 2017-02-18 VITALS — BP 130/61 | HR 65 | Resp 18 | Ht 63.0 in | Wt 237.0 lb

## 2017-02-18 DIAGNOSIS — Z841 Family history of disorders of kidney and ureter: Secondary | ICD-10-CM | POA: Insufficient documentation

## 2017-02-18 DIAGNOSIS — E039 Hypothyroidism, unspecified: Secondary | ICD-10-CM | POA: Diagnosis not present

## 2017-02-18 DIAGNOSIS — Z79899 Other long term (current) drug therapy: Secondary | ICD-10-CM | POA: Insufficient documentation

## 2017-02-18 DIAGNOSIS — I252 Old myocardial infarction: Secondary | ICD-10-CM | POA: Diagnosis not present

## 2017-02-18 DIAGNOSIS — I509 Heart failure, unspecified: Secondary | ICD-10-CM | POA: Insufficient documentation

## 2017-02-18 DIAGNOSIS — N184 Chronic kidney disease, stage 4 (severe): Secondary | ICD-10-CM

## 2017-02-18 DIAGNOSIS — Z8711 Personal history of peptic ulcer disease: Secondary | ICD-10-CM | POA: Insufficient documentation

## 2017-02-18 DIAGNOSIS — D631 Anemia in chronic kidney disease: Secondary | ICD-10-CM | POA: Diagnosis not present

## 2017-02-18 DIAGNOSIS — E669 Obesity, unspecified: Secondary | ICD-10-CM | POA: Diagnosis not present

## 2017-02-18 DIAGNOSIS — N189 Chronic kidney disease, unspecified: Secondary | ICD-10-CM

## 2017-02-18 DIAGNOSIS — E785 Hyperlipidemia, unspecified: Secondary | ICD-10-CM | POA: Diagnosis not present

## 2017-02-18 DIAGNOSIS — Z23 Encounter for immunization: Secondary | ICD-10-CM | POA: Diagnosis not present

## 2017-02-18 DIAGNOSIS — I130 Hypertensive heart and chronic kidney disease with heart failure and stage 1 through stage 4 chronic kidney disease, or unspecified chronic kidney disease: Secondary | ICD-10-CM | POA: Diagnosis not present

## 2017-02-18 DIAGNOSIS — F17210 Nicotine dependence, cigarettes, uncomplicated: Secondary | ICD-10-CM | POA: Diagnosis not present

## 2017-02-18 DIAGNOSIS — Z888 Allergy status to other drugs, medicaments and biological substances status: Secondary | ICD-10-CM | POA: Diagnosis not present

## 2017-02-18 DIAGNOSIS — J45909 Unspecified asthma, uncomplicated: Secondary | ICD-10-CM | POA: Insufficient documentation

## 2017-02-18 DIAGNOSIS — D508 Other iron deficiency anemias: Secondary | ICD-10-CM | POA: Insufficient documentation

## 2017-02-18 DIAGNOSIS — Z833 Family history of diabetes mellitus: Secondary | ICD-10-CM | POA: Insufficient documentation

## 2017-02-18 LAB — COMPREHENSIVE METABOLIC PANEL
ALBUMIN: 3.5 g/dL (ref 3.5–5.0)
ALK PHOS: 74 U/L (ref 38–126)
ALT: 13 U/L — AB (ref 14–54)
AST: 15 U/L (ref 15–41)
Anion gap: 9 (ref 5–15)
BUN: 41 mg/dL — AB (ref 6–20)
CALCIUM: 8.9 mg/dL (ref 8.9–10.3)
CO2: 23 mmol/L (ref 22–32)
CREATININE: 4.13 mg/dL — AB (ref 0.44–1.00)
Chloride: 108 mmol/L (ref 101–111)
GFR calc Af Amer: 12 mL/min — ABNORMAL LOW (ref >60)
GFR calc non Af Amer: 11 mL/min — ABNORMAL LOW (ref >60)
GLUCOSE: 92 mg/dL (ref 65–99)
Potassium: 4.1 mmol/L (ref 3.5–5.1)
SODIUM: 140 mmol/L (ref 135–145)
Total Bilirubin: 0.4 mg/dL (ref 0.3–1.2)
Total Protein: 6.9 g/dL (ref 6.5–8.1)

## 2017-02-18 LAB — CBC WITH DIFFERENTIAL/PLATELET
BASOS PCT: 0 %
Basophils Absolute: 0 10*3/uL (ref 0.0–0.1)
EOS ABS: 0.2 10*3/uL (ref 0.0–0.7)
Eosinophils Relative: 2 %
HCT: 30.3 % — ABNORMAL LOW (ref 36.0–46.0)
HEMOGLOBIN: 9.8 g/dL — AB (ref 12.0–15.0)
LYMPHS ABS: 2 10*3/uL (ref 0.7–4.0)
Lymphocytes Relative: 21 %
MCH: 31.6 pg (ref 26.0–34.0)
MCHC: 32.3 g/dL (ref 30.0–36.0)
MCV: 97.7 fL (ref 78.0–100.0)
Monocytes Absolute: 0.3 10*3/uL (ref 0.1–1.0)
Monocytes Relative: 3 %
NEUTROS ABS: 6.9 10*3/uL (ref 1.7–7.7)
NEUTROS PCT: 74 %
Platelets: 261 10*3/uL (ref 150–400)
RBC: 3.1 MIL/uL — AB (ref 3.87–5.11)
RDW: 14.9 % (ref 11.5–15.5)
WBC: 9.5 10*3/uL (ref 4.0–10.5)

## 2017-02-18 LAB — FERRITIN: Ferritin: 313 ng/mL — ABNORMAL HIGH (ref 11–307)

## 2017-02-18 MED ORDER — INFLUENZA VAC SPLIT QUAD 0.5 ML IM SUSY
0.5000 mL | PREFILLED_SYRINGE | Freq: Once | INTRAMUSCULAR | Status: AC
  Administered 2017-02-18: 0.5 mL via INTRAMUSCULAR

## 2017-02-18 MED ORDER — INFLUENZA VAC SPLIT QUAD 0.5 ML IM SUSY
PREFILLED_SYRINGE | INTRAMUSCULAR | Status: AC
  Filled 2017-02-18: qty 0.5

## 2017-02-18 MED ORDER — DARBEPOETIN ALFA 150 MCG/0.3ML IJ SOSY
150.0000 ug | PREFILLED_SYRINGE | Freq: Once | INTRAMUSCULAR | Status: AC
  Administered 2017-02-18: 150 ug via SUBCUTANEOUS

## 2017-02-18 MED ORDER — DARBEPOETIN ALFA 150 MCG/0.3ML IJ SOSY
PREFILLED_SYRINGE | INTRAMUSCULAR | Status: AC
  Filled 2017-02-18: qty 0.3

## 2017-02-18 NOTE — Patient Instructions (Signed)
[place] at [facility] Discharge Instructions  RECOMMENDATIONS MADE BY THE CONSULTANT AND ANY TEST RESULTS WILL BE SENT TO YOUR REFERRING PHYSICIAN.  Aranesp given, flu vaccine given also. Follow up as scheduled.  Thank you for choosing [place] at [facility] to provide your oncology and hematology care.  To afford each patient quality time with our provider, please arrive at least 15 minutes before your scheduled appointment time.    If you have a lab appointment with the [place] please come in thru the  Main Entrance and check in at the main information desk  You need to re-schedule your appointment should you arrive 10 or more minutes late.  We strive to give you quality time with our providers, and arriving late affects you and other patients whose appointments are after yours.  Also, if you no show three or more times for appointments you may be dismissed from the clinic at the providers discretion.     Again, thank you for choosing [facility].  Our hope is that these requests will decrease the amount of time that you wait before being seen by our physicians.       _____________________________________________________________  Should you have questions after your visit to [facility], please contact our office at [phone] between the hours of 8:30 a.m. and 4:30 p.m.  Voicemails left after 4:30 p.m. will not be returned until the following business day.  For prescription refill requests, have your pharmacy contact our office.       Resources For Cancer Patients and their Caregivers ? American Cancer Society: Can assist with transportation, wigs, general needs, runs Look Good Feel Better.        413 496 7990 ? Cancer Care: Provides financial assistance, online support groups, medication/co-pay assistance.  1-800-813-HOPE 913-754-6306) ? [place] Assists [place]  cancer patients and their families through emotional , educational and financial support.  8632194136 ? [place] DSS Where to apply for food stamps, Medicaid and utility assistance. (480)048-3523 ? RCATS: Transportation to medical appointments. (925)877-0481 ? Social Security Administration: May apply for disability if have a Stage IV cancer. 929-500-5013 (936) 231-3785 ? [employer], Disability and Transit Services: Assists with nutrition, care and transit needs. [place] Support Programs: @10RELATIVEDAYS @ > Cancer Support Group  2nd Tuesday of the month 1pm-2pm, Journey Room  > Creative Journey  3rd Tuesday of the month 1130am-1pm, Journey Room  > Look Good Feel Better  1st Wednesday of the month 10am-12 noon, Journey Room (Call [place] to register [phone])

## 2017-02-18 NOTE — Progress Notes (Signed)
[facility] Hematology/Oncology Consultation   Name: Shannon [name]      MRN: [id number]     Date: 02/18/2017 Time:3:15 PM   REFERRING PHYSICIAN:  Audree Camel, MD (Medical Oncology)  REASON FOR CONSULT:  Transfer of hematologic care   DIAGNOSIS:  Anemia of chronic renal disease, Stage IV  HISTORY OF PRESENT ILLNESS:   Shannon [name] is a 61 y.o. female with a medical history significant for CHF, MI, HTN, asthma, obesity, AVMs and esophageal varices who is referred to the [facility] for anemia of chronic renal disease.  Today patient presents for continued follow up. She has no complaints. Her hemoglobin has been in the 9 g/dl range in the past few months, and is 9.8 g/dl today. She is currently on aranesp 150 mcg qmonthly and is tolerating it well. She states that since we increased the dose of her aranesp on her last visit, she feels like her energy has improved.   Review of Systems  Constitutional: Negative for malaise/fatigue.  HENT: Negative.   Eyes: Negative.   Respiratory: Negative.  Negative for shortness of breath.   Cardiovascular: Negative.  Negative for chest pain.  Gastrointestinal: Negative.  Negative for abdominal pain.  Genitourinary: Negative.   Musculoskeletal: Negative.   Skin: Negative.   Neurological: Negative.   Endo/Heme/Allergies: Negative.   Psychiatric/Behavioral: Negative.   All other systems reviewed and are negative. 14 point review of systems was performed and is negative except as detailed under history of present illness and above   PAST MEDICAL HISTORY:   Past Medical History:  Diagnosis Date  . Absolute anemia 11/10/2015  . Anemia   . Anemia of renal disease 11/10/2015  . Asthma    has Albuterol inhaler prn  . CHF (congestive heart failure) ([place])    last echo showed EF 20%  . Chronic kidney disease    Membranous nephropathy ( Seen at [facility])  . Gastric ulcer   . History of blood transfusion    no abnormal reaction noted  . History of bronchitis    last time 3-32yrs ago  . Hyperlipidemia    takes Pravastatin daily  . Hypertension    takes Imdur and Hydralazine daily as well as Coreg  . Hypothyroidism    takes Synthroid daily  . Itching    on legs-has a Kenalog cream  . Metabolic bone disease   . Nausea    takes Zofran prn  . Obesity   . Peripheral edema    takes Torsemide daily  . Thyroid disease    hypothyroidism    ALLERGIES: Allergies  Allergen Reactions  . Corticosteroids Hives, Itching and Nausea And Vomiting  . Rituximab     wheezing      MEDICATIONS: I have reviewed the patient's current medications.    Current Outpatient Prescriptions on File Prior to Visit  Medication Sig Dispense Refill  . albuterol (PROVENTIL HFA;VENTOLIN HFA) 108 (90 BASE) MCG/ACT inhaler Inhale into the lungs every 6 (six) hours as needed for wheezing.    . carvedilol (COREG) 25 MG tablet Take 25 mg by mouth daily.     . clobetasol cream (TEMOVATE) 6.19 % Apply 1 application topically 2 (two) times daily.    . hydrALAZINE (APRESOLINE) 50 MG tablet Take 50 mg by mouth every 8 (eight) hours.    . isosorbide mononitrate (IMDUR) 30 MG 24 hr tablet Take 30 mg by mouth daily.    Marland Kitchen lisinopril (PRINIVIL,ZESTRIL)  20 MG tablet Take 1 tablet by mouth daily.    . metolazone (ZAROXOLYN) 5 MG tablet Take 5 mg by mouth 2 (two) times daily as needed. Reported on 10/05/2015    . ondansetron (ZOFRAN ODT) 8 MG disintegrating tablet Take 1 tablet (8 mg total) by mouth every 8 (eight) hours as needed for nausea or vomiting. 20 tablet 0  . pravastatin (PRAVACHOL) 80 MG tablet Take 80 mg by mouth daily.    . raloxifene (EVISTA) 60 MG tablet Take 60 mg by mouth daily.    Marland Kitchen RAYALDEE 30 MCG CPCR     . sodium bicarbonate 650 MG tablet Take 650 mg by mouth 3 (three) times daily.    Marland Kitchen torsemide (DEMADEX) 20 MG tablet Take 20 mg by mouth daily.     No current facility-administered medications on file prior to  visit.      PAST SURGICAL HISTORY Past Surgical History:  Procedure Laterality Date  . AV FISTULA PLACEMENT Left 07/22/2012   Procedure: INSERTION OF ARTERIOVENOUS GORE-TEX GRAFT ARM;  Surgeon: Angelia Mould, MD;  Location: [place];  Service: Vascular;  Laterality: Left;  . CESAREAN SECTION     30+yrs ago  . COLONOSCOPY    . left arm surgery with pin     15+yrs ago    FAMILY HISTORY: Family History  Problem Relation Age of Onset  . Kidney disease Other   . Diabetes Mother     SOCIAL HISTORY:  reports that she has been smoking Cigarettes.  She started smoking about 48 years ago. She has a 40.00 pack-year smoking history. She has never used smokeless tobacco. She reports that she does not drink alcohol or use drugs.  Social History   Social History  . Marital status: Single    Spouse name: N/A  . Number of children: N/A  . Years of education: N/A   Social History Main Topics  . Smoking status: Current Every Day Smoker    Packs/day: 1.00    Years: 40.00    Types: Cigarettes    Start date: 05/14/1968  . Smokeless tobacco: Never Used  . Alcohol use No     Comment: nothing in 2-67yrs   . Drug use: No  . Sexual activity: Not Currently   Other Topics Concern  . Not on file   Social History Narrative  . No narrative on file    PERFORMANCE STATUS: The patient's performance status is 1 - Symptomatic but completely ambulatory   PHYSICAL EXAM: Blood pressure 130/61, pulse 65, resp. rate 18, height 5\' 3"  (1.6 m), weight 237 lb (107.5 kg), SpO2 100 %.  Physical Exam  Constitutional: She is oriented to person, place, and time. She appears well-developed and well-nourished.  Eyes: Pupils are equal, round, and reactive to light. Conjunctivae are normal. No scleral icterus.  Neck: Neck supple.  Cardiovascular: Normal rate, regular rhythm and normal heart sounds.  Exam reveals no gallop and no friction rub.   No murmur heard. Pulmonary/Chest: Effort normal and breath  sounds normal. No respiratory distress. She has no wheezes. She has no rales. She exhibits no tenderness.  Abdominal: Soft. Bowel sounds are normal. She exhibits no distension and no mass. There is no tenderness. There is no rebound and no guarding.  Neurological: She is alert and oriented to person, place, and time.  Skin: Skin is warm and dry.  Psychiatric: She has a normal mood and affect. Her behavior is normal. Judgment and thought content normal.  LABORATORY DATA:  I have personally reviewed the laboratory studied detailed below: CBC    Component Value Date/Time   WBC 9.5 02/18/2017 1459   RBC 3.10 (L) 02/18/2017 1459   HGB 9.8 (L) 02/18/2017 1459   HCT 30.3 (L) 02/18/2017 1459   PLT 261 02/18/2017 1459   MCV 97.7 02/18/2017 1459   MCH 31.6 02/18/2017 1459   MCHC 32.3 02/18/2017 1459   RDW 14.9 02/18/2017 1459   LYMPHSABS 2.0 02/18/2017 1459   MONOABS 0.3 02/18/2017 1459   EOSABS 0.2 02/18/2017 1459   BASOSABS 0.0 02/18/2017 1459   CMP Latest Ref Rng & Units 01/21/2017 12/24/2016 11/26/2016  Glucose 65 - 99 mg/dL 95 91 141(H)  BUN 6 - 20 mg/dL 50(H) 44(H) 43(H)  Creatinine 0.44 - 1.00 mg/dL 3.95(H) 4.58(H) 4.10(H)  Sodium 135 - 145 mmol/L 139 139 141  Potassium 3.5 - 5.1 mmol/L 5.1 4.2 4.4  Chloride 101 - 111 mmol/L 110 109 112(H)  CO2 22 - 32 mmol/L 22 20(L) 22  Calcium 8.9 - 10.3 mg/dL 8.5(L) 9.1 8.5(L)  Total Protein 6.5 - 8.1 g/dL 6.9 7.1 6.6  Total Bilirubin 0.3 - 1.2 mg/dL 0.4 0.4 0.5  Alkaline Phos 38 - 126 U/L 69 74 72  AST 15 - 41 U/L 18 18 15   ALT 14 - 54 U/L 21 14 13(L)      PATHOLOGY:  N/A  ASSESSMENT/PLAN:   Anemia secondary to stage IV CKD History of AVMs History of esophageal varices  PLAN: - Aranesp today, hemoglobin 9.8 g/dL. - Cont aranesp 150 mcg q monthly so that we can get her as close to a hemoglobin of 10g/dl as possible. She states her nephrologist does not want her hemoglobin higher than 10 due to issues with hypertension. - RTC in 4  months for follow up with CBC, CMP, and iron studies.    All questions were answered. The patient knows to call the clinic with any problems, questions or concerns. We can certainly see the patient much sooner if necessary.  This note is electronically signed by:  Twana First, MD  02/18/2017 3:15 PM

## 2017-02-18 NOTE — Progress Notes (Signed)
Shannon [name] presents today for injection per MD orders. Aranesp 182mcg administered SQ in right Abdomen. Administration without incident. Patient tolerated well.   Shannon [name] presents today for injection per MD orders. Fluarix 0.5 ml administered IM in right Upper Arm. Administration without incident. Patient tolerated well.  Treatment given per orders. Patient tolerated it well without problems. Vitals stable and discharged home from clinic ambulatory. Follow up as scheduled.

## 2017-02-20 MED ORDER — POTASSIUM CHLORIDE CRYS ER 20 MEQ PO TBCR
EXTENDED_RELEASE_TABLET | ORAL | Status: AC
  Filled 2017-02-20: qty 1

## 2017-03-21 ENCOUNTER — Encounter (HOSPITAL_COMMUNITY): Payer: Medicare Other | Attending: Oncology

## 2017-03-21 ENCOUNTER — Encounter (HOSPITAL_COMMUNITY): Payer: Medicare Other

## 2017-03-21 DIAGNOSIS — D508 Other iron deficiency anemias: Secondary | ICD-10-CM

## 2017-03-21 LAB — CBC WITH DIFFERENTIAL/PLATELET
Basophils Absolute: 0 10*3/uL (ref 0.0–0.1)
Basophils Relative: 0 %
EOS PCT: 3 %
Eosinophils Absolute: 0.3 10*3/uL (ref 0.0–0.7)
HCT: 32.3 % — ABNORMAL LOW (ref 36.0–46.0)
HEMOGLOBIN: 10.6 g/dL — AB (ref 12.0–15.0)
LYMPHS ABS: 1.6 10*3/uL (ref 0.7–4.0)
LYMPHS PCT: 19 %
MCH: 32.1 pg (ref 26.0–34.0)
MCHC: 32.8 g/dL (ref 30.0–36.0)
MCV: 97.9 fL (ref 78.0–100.0)
MONOS PCT: 3 %
Monocytes Absolute: 0.3 10*3/uL (ref 0.1–1.0)
NEUTROS PCT: 75 %
Neutro Abs: 6.4 10*3/uL (ref 1.7–7.7)
Platelets: 227 10*3/uL (ref 150–400)
RBC: 3.3 MIL/uL — ABNORMAL LOW (ref 3.87–5.11)
RDW: 14.3 % (ref 11.5–15.5)
WBC: 8.6 10*3/uL (ref 4.0–10.5)

## 2017-03-21 LAB — COMPREHENSIVE METABOLIC PANEL
ALK PHOS: 71 U/L (ref 38–126)
ALT: 12 U/L — AB (ref 14–54)
AST: 16 U/L (ref 15–41)
Albumin: 3.4 g/dL — ABNORMAL LOW (ref 3.5–5.0)
Anion gap: 10 (ref 5–15)
BUN: 38 mg/dL — ABNORMAL HIGH (ref 6–20)
CALCIUM: 9.2 mg/dL (ref 8.9–10.3)
CO2: 22 mmol/L (ref 22–32)
CREATININE: 3.92 mg/dL — AB (ref 0.44–1.00)
Chloride: 106 mmol/L (ref 101–111)
GFR calc non Af Amer: 11 mL/min — ABNORMAL LOW (ref >60)
GFR, EST AFRICAN AMERICAN: 13 mL/min — AB (ref >60)
Glucose, Bld: 105 mg/dL — ABNORMAL HIGH (ref 65–99)
Potassium: 4 mmol/L (ref 3.5–5.1)
Sodium: 138 mmol/L (ref 135–145)
TOTAL PROTEIN: 7 g/dL (ref 6.5–8.1)
Total Bilirubin: 0.7 mg/dL (ref 0.3–1.2)

## 2017-03-21 LAB — FERRITIN: Ferritin: 352 ng/mL — ABNORMAL HIGH (ref 11–307)

## 2017-03-21 NOTE — Progress Notes (Signed)
Hgb 10.6 today so pt's Aranesp injection was held per parameters given. Reviewed this information with the pt who verbalized understanding. Pt discharged self ambulatory in satisfactory condition

## 2017-04-19 ENCOUNTER — Encounter (HOSPITAL_COMMUNITY): Payer: Medicare Other | Attending: Oncology

## 2017-04-19 ENCOUNTER — Encounter (HOSPITAL_COMMUNITY): Payer: Self-pay

## 2017-04-19 ENCOUNTER — Encounter (HOSPITAL_COMMUNITY): Payer: Medicare Other

## 2017-04-19 ENCOUNTER — Other Ambulatory Visit: Payer: Self-pay

## 2017-04-19 VITALS — BP 111/52 | HR 61 | Temp 97.8°F | Resp 18

## 2017-04-19 DIAGNOSIS — N184 Chronic kidney disease, stage 4 (severe): Secondary | ICD-10-CM

## 2017-04-19 DIAGNOSIS — D508 Other iron deficiency anemias: Secondary | ICD-10-CM | POA: Insufficient documentation

## 2017-04-19 DIAGNOSIS — D631 Anemia in chronic kidney disease: Secondary | ICD-10-CM

## 2017-04-19 DIAGNOSIS — N189 Chronic kidney disease, unspecified: Secondary | ICD-10-CM

## 2017-04-19 LAB — COMPREHENSIVE METABOLIC PANEL WITH GFR
ALT: 16 U/L (ref 14–54)
AST: 15 U/L (ref 15–41)
Albumin: 3.5 g/dL (ref 3.5–5.0)
Alkaline Phosphatase: 72 U/L (ref 38–126)
Anion gap: 7 (ref 5–15)
BUN: 50 mg/dL — ABNORMAL HIGH (ref 6–20)
CO2: 19 mmol/L — ABNORMAL LOW (ref 22–32)
Calcium: 9.2 mg/dL (ref 8.9–10.3)
Chloride: 112 mmol/L — ABNORMAL HIGH (ref 101–111)
Creatinine, Ser: 4.53 mg/dL — ABNORMAL HIGH (ref 0.44–1.00)
GFR calc Af Amer: 11 mL/min — ABNORMAL LOW
GFR calc non Af Amer: 10 mL/min — ABNORMAL LOW
Glucose, Bld: 110 mg/dL — ABNORMAL HIGH (ref 65–99)
Potassium: 4.3 mmol/L (ref 3.5–5.1)
Sodium: 138 mmol/L (ref 135–145)
Total Bilirubin: 0.4 mg/dL (ref 0.3–1.2)
Total Protein: 6.9 g/dL (ref 6.5–8.1)

## 2017-04-19 LAB — CBC WITH DIFFERENTIAL/PLATELET
BASOS ABS: 0 10*3/uL (ref 0.0–0.1)
Basophils Relative: 0 %
Eosinophils Absolute: 0.2 10*3/uL (ref 0.0–0.7)
Eosinophils Relative: 2 %
HEMATOCRIT: 30.3 % — AB (ref 36.0–46.0)
Hemoglobin: 9.6 g/dL — ABNORMAL LOW (ref 12.0–15.0)
LYMPHS PCT: 18 %
Lymphs Abs: 1.4 10*3/uL (ref 0.7–4.0)
MCH: 31.3 pg (ref 26.0–34.0)
MCHC: 31.7 g/dL (ref 30.0–36.0)
MCV: 98.7 fL (ref 78.0–100.0)
MONO ABS: 0.3 10*3/uL (ref 0.1–1.0)
MONOS PCT: 4 %
NEUTROS ABS: 6 10*3/uL (ref 1.7–7.7)
Neutrophils Relative %: 76 %
Platelets: 224 10*3/uL (ref 150–400)
RBC: 3.07 MIL/uL — ABNORMAL LOW (ref 3.87–5.11)
RDW: 14 % (ref 11.5–15.5)
WBC: 7.9 10*3/uL (ref 4.0–10.5)

## 2017-04-19 LAB — FERRITIN: FERRITIN: 352 ng/mL — AB (ref 11–307)

## 2017-04-19 MED ORDER — DARBEPOETIN ALFA 150 MCG/0.3ML IJ SOSY
PREFILLED_SYRINGE | INTRAMUSCULAR | Status: AC
  Filled 2017-04-19: qty 0.3

## 2017-04-19 MED ORDER — DARBEPOETIN ALFA 150 MCG/0.3ML IJ SOSY
150.0000 ug | PREFILLED_SYRINGE | Freq: Once | INTRAMUSCULAR | Status: AC
  Administered 2017-04-19: 150 ug via SUBCUTANEOUS

## 2017-04-19 NOTE — Patient Instructions (Signed)
[facility] at [facility] Discharge Instructions  RECOMMENDATIONS MADE BY THE CONSULTANT AND ANY TEST RESULTS WILL BE SENT TO YOUR REFERRING PHYSICIAN.  Aranesp given Follow up as scheduled  Thank you for choosing [facility] at [facility] to provide your oncology and hematology care.  To afford each patient quality time with our provider, please arrive at least 15 minutes before your scheduled appointment time.    If you have a lab appointment with the Cancer Center please come in thru the  Main Entrance and check in at the main information desk  You need to re-schedule your appointment should you arrive 10 or more minutes late.  We strive to give you quality time with our providers, and arriving late affects you and other patients whose appointments are after yours.  Also, if you no show three or more times for appointments you may be dismissed from the clinic at the providers discretion.     Again, thank you for choosing [facility].  Our hope is that these requests will decrease the amount of time that you wait before being seen by our physicians.       _____________________________________________________________  Should you have questions after your visit to  Cancer Center, please contact our office at [phone] between the hours of 8:30 a.m. and 4:30 p.m.  Voicemails left after 4:30 p.m. will not be returned until the following business day.  For prescription refill requests, have your pharmacy contact our office.       Resources For Cancer Patients and their Caregivers ? American Cancer Society: Can assist with transportation, wigs, general needs, runs Look Good Feel Better.        1-888-227-6333 ? Cancer Care: Provides financial assistance, online support groups, medication/co-pay assistance.  1-800-813-HOPE (4673) ? [facility] Assists [place] cancer patients and their  families through emotional , educational and financial support.  336-427-4357 ? [place] DSS Where to apply for food stamps, Medicaid and utility assistance. 336-342-1394 ? [facility]: Transportation to medical appointments. 336-347-2287 ? Social Security Administration: May apply for disability if have a Stage IV cancer. 336-342-7796 1-800-772-1213 ? [place] Aging, Disability and Transit Services: Assists with nutrition, care and transit needs. 336-349-2343  Cancer Center Support Programs: @10RELATIVEDAYS@ > Cancer Support Group  2nd Tuesday of the month 1pm-2pm, Journey Room  > Creative Journey  3rd Tuesday of the month 1130am-1pm, Journey Room  > Look Good Feel Better  1st Wednesday of the month 10am-12 noon, Journey Room (Call American Cancer Society to register 1-800-395-5775)   

## 2017-04-19 NOTE — Progress Notes (Signed)
Shannon [name] presents today for injection per MD orders. Aranesp 137mcg administered SQ in right abdomen. Administration without incident. Patient tolerated well.  Treatment given per orders. Patient tolerated it well without problems. Vitals stable and discharged home from clinic ambulatory. Follow up as scheduled.

## 2017-04-30 ENCOUNTER — Other Ambulatory Visit: Payer: Self-pay

## 2017-04-30 DIAGNOSIS — T82868A Thrombosis of vascular prosthetic devices, implants and grafts, initial encounter: Secondary | ICD-10-CM

## 2017-04-30 DIAGNOSIS — N184 Chronic kidney disease, stage 4 (severe): Secondary | ICD-10-CM

## 2017-05-10 ENCOUNTER — Ambulatory Visit (INDEPENDENT_AMBULATORY_CARE_PROVIDER_SITE_OTHER)
Admission: RE | Admit: 2017-05-10 | Discharge: 2017-05-10 | Disposition: A | Discharge status: Home / Self Care | Payer: Medicare Other | Source: Ambulatory Visit | Attending: Vascular Surgery | Admitting: Vascular Surgery

## 2017-05-10 ENCOUNTER — Ambulatory Visit (HOSPITAL_COMMUNITY)
Admission: RE | Admit: 2017-05-10 | Discharge: 2017-05-10 | Disposition: A | Discharge status: Home / Self Care | Payer: Medicare Other | Source: Ambulatory Visit | Attending: Vascular Surgery | Admitting: Vascular Surgery

## 2017-05-10 DIAGNOSIS — N184 Chronic kidney disease, stage 4 (severe): Secondary | ICD-10-CM | POA: Diagnosis not present

## 2017-05-10 DIAGNOSIS — Z01818 Encounter for other preprocedural examination: Secondary | ICD-10-CM | POA: Insufficient documentation

## 2017-05-10 DIAGNOSIS — T82868A Thrombosis of vascular prosthetic devices, implants and grafts, initial encounter: Secondary | ICD-10-CM

## 2017-05-10 DIAGNOSIS — Y832 Surgical operation with anastomosis, bypass or graft as the cause of abnormal reaction of the patient, or of later complication, without mention of misadventure at the time of the procedure: Secondary | ICD-10-CM | POA: Insufficient documentation

## 2017-05-21 ENCOUNTER — Other Ambulatory Visit (HOSPITAL_COMMUNITY)
Admission: RE | Admit: 2017-05-21 | Discharge: 2017-05-21 | Disposition: A | Discharge status: Home / Self Care | Payer: Medicare Other | Source: Ambulatory Visit | Attending: Adult Health | Admitting: Adult Health

## 2017-05-21 ENCOUNTER — Other Ambulatory Visit (HOSPITAL_COMMUNITY): Payer: Self-pay | Admitting: Emergency Medicine

## 2017-05-21 ENCOUNTER — Inpatient Hospital Stay (HOSPITAL_COMMUNITY): Payer: Medicare Other | Attending: Oncology

## 2017-05-21 ENCOUNTER — Encounter (HOSPITAL_COMMUNITY): Payer: Self-pay

## 2017-05-21 ENCOUNTER — Inpatient Hospital Stay (HOSPITAL_COMMUNITY): Payer: Medicare Other

## 2017-05-21 VITALS — BP 140/64 | HR 63 | Temp 98.4°F | Resp 18

## 2017-05-21 DIAGNOSIS — N184 Chronic kidney disease, stage 4 (severe): Secondary | ICD-10-CM | POA: Insufficient documentation

## 2017-05-21 DIAGNOSIS — D631 Anemia in chronic kidney disease: Secondary | ICD-10-CM | POA: Diagnosis present

## 2017-05-21 DIAGNOSIS — D508 Other iron deficiency anemias: Secondary | ICD-10-CM | POA: Diagnosis present

## 2017-05-21 DIAGNOSIS — N189 Chronic kidney disease, unspecified: Secondary | ICD-10-CM

## 2017-05-21 LAB — CBC WITH DIFFERENTIAL/PLATELET
BASOS ABS: 0 10*3/uL (ref 0.0–0.1)
Basophils Relative: 0 %
Eosinophils Absolute: 0.2 10*3/uL (ref 0.0–0.7)
Eosinophils Relative: 1 %
HCT: 31.8 % — ABNORMAL LOW (ref 36.0–46.0)
Hemoglobin: 9.9 g/dL — ABNORMAL LOW (ref 12.0–15.0)
Lymphocytes Relative: 7 %
Lymphs Abs: 1 10*3/uL (ref 0.7–4.0)
MCH: 30.9 pg (ref 26.0–34.0)
MCHC: 31.1 g/dL (ref 30.0–36.0)
MCV: 99.4 fL (ref 78.0–100.0)
MONO ABS: 0.6 10*3/uL (ref 0.1–1.0)
Monocytes Relative: 4 %
NEUTROS ABS: 12.6 10*3/uL — AB (ref 1.7–7.7)
Neutrophils Relative %: 88 %
PLATELETS: 236 10*3/uL (ref 150–400)
RBC: 3.2 MIL/uL — ABNORMAL LOW (ref 3.87–5.11)
RDW: 14.4 % (ref 11.5–15.5)
WBC: 14.4 10*3/uL — ABNORMAL HIGH (ref 4.0–10.5)

## 2017-05-21 LAB — COMPREHENSIVE METABOLIC PANEL
ALBUMIN: 3.3 g/dL — AB (ref 3.5–5.0)
ALT: 16 U/L (ref 14–54)
AST: 16 U/L (ref 15–41)
Alkaline Phosphatase: 76 U/L (ref 38–126)
Anion gap: 9 (ref 5–15)
BUN: 58 mg/dL — AB (ref 6–20)
CHLORIDE: 110 mmol/L (ref 101–111)
CO2: 21 mmol/L — ABNORMAL LOW (ref 22–32)
Calcium: 8.9 mg/dL (ref 8.9–10.3)
Creatinine, Ser: 4.32 mg/dL — ABNORMAL HIGH (ref 0.44–1.00)
GFR calc Af Amer: 12 mL/min — ABNORMAL LOW (ref >60)
GFR calc non Af Amer: 10 mL/min — ABNORMAL LOW (ref >60)
GLUCOSE: 109 mg/dL — AB (ref 65–99)
POTASSIUM: 4.8 mmol/L (ref 3.5–5.1)
SODIUM: 140 mmol/L (ref 135–145)
Total Bilirubin: 0.2 mg/dL — ABNORMAL LOW (ref 0.3–1.2)
Total Protein: 6.6 g/dL (ref 6.5–8.1)

## 2017-05-21 LAB — RENAL FUNCTION PANEL
ALBUMIN: 3.3 g/dL — AB (ref 3.5–5.0)
Anion gap: 9 (ref 5–15)
BUN: 59 mg/dL — AB (ref 6–20)
CO2: 21 mmol/L — ABNORMAL LOW (ref 22–32)
CREATININE: 4.29 mg/dL — AB (ref 0.44–1.00)
Calcium: 8.8 mg/dL — ABNORMAL LOW (ref 8.9–10.3)
Chloride: 108 mmol/L (ref 101–111)
GFR calc Af Amer: 12 mL/min — ABNORMAL LOW (ref >60)
GFR calc non Af Amer: 10 mL/min — ABNORMAL LOW (ref >60)
GLUCOSE: 107 mg/dL — AB (ref 65–99)
PHOSPHORUS: 3.2 mg/dL (ref 2.5–4.6)
POTASSIUM: 4.8 mmol/L (ref 3.5–5.1)
SODIUM: 138 mmol/L (ref 135–145)

## 2017-05-21 LAB — FERRITIN: FERRITIN: 351 ng/mL — AB (ref 11–307)

## 2017-05-21 LAB — IRON AND TIBC
Iron: 59 ug/dL (ref 28–170)
Saturation Ratios: 28 % (ref 10.4–31.8)
TIBC: 210 ug/dL — ABNORMAL LOW (ref 250–450)
UIBC: 151 ug/dL

## 2017-05-21 LAB — PROTEIN / CREATININE RATIO, URINE
Creatinine, Urine: 71.3 mg/dL
PROTEIN CREATININE RATIO: 0.62 mg/mg{creat} — AB (ref 0.00–0.15)
Total Protein, Urine: 44 mg/dL

## 2017-05-21 MED ORDER — DARBEPOETIN ALFA 150 MCG/0.3ML IJ SOSY
150.0000 ug | PREFILLED_SYRINGE | Freq: Once | INTRAMUSCULAR | Status: AC
  Administered 2017-05-21: 150 ug via SUBCUTANEOUS
  Filled 2017-05-21: qty 0.3

## 2017-05-21 MED ORDER — HEPARIN SOD (PORK) LOCK FLUSH 100 UNIT/ML IV SOLN
INTRAVENOUS | Status: AC
  Filled 2017-05-21: qty 5

## 2017-05-21 NOTE — Patient Instructions (Signed)
[facility] at [facility]  Discharge Instructions:  You received an aranesp shot today.  _______________________________________________________________  Thank you for choosing [facility] at [facility] to provide your oncology and hematology care.  To afford each patient quality time with our providers, please arrive at least 15 minutes before your scheduled appointment.  You need to re-schedule your appointment if you arrive 10 or more minutes late.  We strive to give you quality time with our providers, and arriving late affects you and other patients whose appointments are after yours.  Also, if you no show three or more times for appointments you may be dismissed from the clinic.  Again, thank you for choosing  [facility] Center at [facility]. Our hope is that these requests will allow you access to exceptional care and in a timely manner. _______________________________________________________________  If you have questions after your visit, please contact our office at [phone] between the hours of 8:30 a.m. and 5:00 p.m. Voicemails left after 4:30 p.m. will not be returned until the following business day. _______________________________________________________________  For prescription refill requests, have your pharmacy contact our office. _______________________________________________________________  Recommendations made by the consultant and any test results will be sent to your referring physician. _______________________________________________________________ 

## 2017-05-21 NOTE — Progress Notes (Signed)
Patient tolerated aranesp shot with no complaints voiced.  Injection site clean and dry with no bruising or swelling noted at site.  Band aid applied.  VSS with discharge and left ambulatory with no s/s of distress noted.   

## 2017-05-22 ENCOUNTER — Encounter: Payer: Self-pay | Admitting: *Deleted

## 2017-05-22 ENCOUNTER — Other Ambulatory Visit: Payer: Self-pay | Admitting: *Deleted

## 2017-05-22 ENCOUNTER — Ambulatory Visit: Payer: Medicare Other | Admitting: Vascular Surgery

## 2017-05-22 ENCOUNTER — Encounter: Payer: Self-pay | Admitting: Vascular Surgery

## 2017-05-22 VITALS — BP 156/73 | HR 67 | Temp 97.1°F | Resp 18 | Ht 63.0 in | Wt 240.0 lb

## 2017-05-22 DIAGNOSIS — N186 End stage renal disease: Secondary | ICD-10-CM | POA: Diagnosis not present

## 2017-05-22 DIAGNOSIS — Z992 Dependence on renal dialysis: Secondary | ICD-10-CM | POA: Diagnosis not present

## 2017-05-22 LAB — VITAMIN D 25 HYDROXY (VIT D DEFICIENCY, FRACTURES): Vit D, 25-Hydroxy: 51.8 ng/mL (ref 30.0–100.0)

## 2017-05-22 LAB — PARATHYROID HORMONE, INTACT (NO CA): PTH: 119 pg/mL — ABNORMAL HIGH (ref 15–65)

## 2017-05-22 NOTE — Progress Notes (Signed)
Patient name: Shannon [name] MRN: [id number] DOB: 1956/03/10 Sex: female  REASON FOR VISIT:   Clotted AV access.  The consult is requested by Dr. Lowanda Foster.  HPI:   Shannon [name] is a pleasant 62 y.o. female who I last saw a year and a half ago on 10/05/2015.  Patient had a new left upper arm graft placed in March 2014.  She was not a candidate for a fistula in that arm.  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  At the time of her last visit, the graft was occluded.  It was not clear how long the graft had been occluded.  I did not feel would be wise to chase the graft as she was not yet on dialysis.  I felt we should place a new graft that she was closer to needing dialysis.  With  I have reviewed the records that were sent from the referring office.  Her GFR is down to 11.  She is asymptomatic.  Her hypertension has been under good control.  She also has metabolic bone disease.  Past Medical History:  Diagnosis Date  . Absolute anemia 11/10/2015  . Anemia   . Anemia of renal disease 11/10/2015  . Asthma    has Albuterol inhaler prn  . CHF (congestive heart failure) ([place])    last echo showed EF 20%  . Chronic kidney disease    Membranous nephropathy ( Seen at [facility])  . Gastric ulcer   . History of blood transfusion    no abnormal reaction noted  . History of bronchitis    last time 3-28yrs ago  . Hyperlipidemia    takes Pravastatin daily  . Hypertension    takes Imdur and Hydralazine daily as well as Coreg  . Hypothyroidism    takes Synthroid daily  . Itching    on legs-has a Kenalog cream  . Metabolic bone disease   . Nausea    takes Zofran prn  . Obesity   . Peripheral edema    takes Torsemide daily  . Thyroid disease    hypothyroidism    Family History  Problem Relation Age of Onset  . Kidney disease Other   . Diabetes Mother     SOCIAL HISTORY: Social History   Tobacco Use  . Smoking status: Current Every Day  Smoker    Packs/day: 1.00    Years: 40.00    Pack years: 40.00    Types: Cigarettes    Start date: 05/14/1968  . Smokeless tobacco: Never Used  Substance Use Topics  . Alcohol use: No    Comment: nothing in 2-69yrs     Allergies  Allergen Reactions  . Corticosteroids Hives, Itching and Nausea And Vomiting  . Rituximab     wheezing    Current Outpatient Medications  Medication Sig Dispense Refill  . albuterol (PROVENTIL HFA;VENTOLIN HFA) 108 (90 BASE) MCG/ACT inhaler Inhale into the lungs every 6 (six) hours as needed for wheezing.    . carvedilol (COREG) 25 MG tablet Take 25 mg by mouth daily.     . clobetasol cream (TEMOVATE) 9.83 % Apply 1 application topically 2 (two) times daily.    . hydrALAZINE (APRESOLINE) 50 MG tablet Take 50 mg by mouth every 8 (eight) hours.    . isosorbide mononitrate (IMDUR) 30 MG 24 hr tablet Take 30 mg by mouth daily.    Marland Kitchen lisinopril (PRINIVIL,ZESTRIL) 20 MG tablet Take 1 tablet by mouth daily.    Marland Kitchen  metolazone (ZAROXOLYN) 5 MG tablet Take 5 mg by mouth 2 (two) times daily as needed. Reported on 10/05/2015    . ondansetron (ZOFRAN ODT) 8 MG disintegrating tablet Take 1 tablet (8 mg total) by mouth every 8 (eight) hours as needed for nausea or vomiting. 20 tablet 0  . pravastatin (PRAVACHOL) 80 MG tablet Take 80 mg by mouth daily.    . raloxifene (EVISTA) 60 MG tablet Take 60 mg by mouth daily.    Marland Kitchen RAYALDEE 30 MCG CPCR     . sodium bicarbonate 650 MG tablet Take 650 mg by mouth 3 (three) times daily.    Marland Kitchen torsemide (DEMADEX) 20 MG tablet Take 20 mg by mouth daily.     No current facility-administered medications for this visit.     REVIEW OF SYSTEMS:  [X]  denotes positive finding, [ ]  denotes negative finding Cardiac  Comments:  Chest pain or chest pressure:    Shortness of breath upon exertion:    Short of breath when lying flat:    Irregular heart rhythm:        Vascular    Pain in calf, thigh, or hip brought on by ambulation:    Pain in  feet at night that wakes you up from your sleep:     Blood clot in your veins:    Leg swelling:         Pulmonary    Oxygen at home:    Productive cough:     Wheezing:  X       Neurologic    Sudden weakness in arms or legs:     Sudden numbness in arms or legs:     Sudden onset of difficulty speaking or slurred speech:    Temporary loss of vision in one eye:     Problems with dizziness:         Gastrointestinal    Blood in stool:     Vomited blood:         Genitourinary    Burning when urinating:     Blood in urine:        Psychiatric    Major depression:         Hematologic    Bleeding problems:    Problems with blood clotting too easily:        Skin    Rashes or ulcers:        Constitutional    Fever or chills:     PHYSICAL EXAM:   Vitals:   05/22/17 0902 05/22/17 0903  BP: (!) 150/74 (!) 156/73  Pulse: 67   Resp: 18   Temp: (!) 97.1 F (36.2 C)   TempSrc: Oral   SpO2: 100%   Weight: 240 lb (108.9 kg)   Height: 5\' 3"  (1.6 m)     GENERAL: The patient is a well-nourished female, in no acute distress. The vital signs are documented above. CARDIAC: There is a regular rate and rhythm.  VASCULAR: She has a palpable brachial and radial pulse bilaterally. She has a nonfunctioning left upper arm graft. PULMONARY: There is good air exchange bilaterally without wheezing or rales. ABDOMEN: Soft and non-tender with normal pitched bowel sounds.  MUSCULOSKELETAL: There are no major deformities or cyanosis. NEUROLOGIC: No focal weakness or paresthesias are detected. SKIN: There are no ulcers or rashes noted. PSYCHIATRIC: The patient has a normal affect.  DATA:    VEIN MAP: I reviewed the vein map that was done on 05/10/2017.  This shows that  the forearm cephalic vein is small.  The cephalic vein at the antecubital level and distal upper arm could not be visualized.  The basilic vein was marginal in size.  MEDICAL ISSUES:   STAGE IV CHRONIC KIDNEY DISEASE: This  patient is nearing dialysis and we have been asked to place a new access.  Given that she has an upper arm graft on the left that goes fairly high I would recommend placing access in the right arm.  It does not appear that she is a candidate for a forearm or upper arm cephalic vein fistula but could potentially have a basilic vein transposition although I think the chance of this is fairly small.  More likely she will require placement of an AV graft.  We have discussed the indications for the procedure and the potential complications and she is agreeable to proceed.  If she can get a ride were trying to schedule this for 05/24/2017.  [name] [facility] [phone]

## 2017-05-22 NOTE — H&P (View-Only) (Signed)
Patient name: Shannon [name] MRN: [id number] DOB: 03/04/1956 Sex: female  REASON FOR VISIT:   Clotted AV access.  The consult is requested by Dr. Lowanda Foster.  HPI:   Shannon [name] is a pleasant 62 y.o. female who I last saw a year and a half ago on 10/05/2015.  Patient had a new left upper arm graft placed in March 2014.  She was not a candidate for a fistula in that arm.  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  At the time of her last visit, the graft was occluded.  It was not clear how long the graft had been occluded.  I did not feel would be wise to chase the graft as she was not yet on dialysis.  I felt we should place a new graft that she was closer to needing dialysis.  With  I have reviewed the records that were sent from the referring office.  Her GFR is down to 11.  She is asymptomatic.  Her hypertension has been under good control.  She also has metabolic bone disease.  Past Medical History:  Diagnosis Date  . Absolute anemia 11/10/2015  . Anemia   . Anemia of renal disease 11/10/2015  . Asthma    has Albuterol inhaler prn  . CHF (congestive heart failure) ([place])    last echo showed EF 20%  . Chronic kidney disease    Membranous nephropathy ( Seen at [facility])  . Gastric ulcer   . History of blood transfusion    no abnormal reaction noted  . History of bronchitis    last time 3-74yrs ago  . Hyperlipidemia    takes Pravastatin daily  . Hypertension    takes Imdur and Hydralazine daily as well as Coreg  . Hypothyroidism    takes Synthroid daily  . Itching    on legs-has a Kenalog cream  . Metabolic bone disease   . Nausea    takes Zofran prn  . Obesity   . Peripheral edema    takes Torsemide daily  . Thyroid disease    hypothyroidism    Family History  Problem Relation Age of Onset  . Kidney disease Other   . Diabetes Mother     SOCIAL HISTORY: Social History   Tobacco Use  . Smoking status: Current Every Day  Smoker    Packs/day: 1.00    Years: 40.00    Pack years: 40.00    Types: Cigarettes    Start date: 05/14/1968  . Smokeless tobacco: Never Used  Substance Use Topics  . Alcohol use: No    Comment: nothing in 2-2yrs     Allergies  Allergen Reactions  . Corticosteroids Hives, Itching and Nausea And Vomiting  . Rituximab     wheezing    Current Outpatient Medications  Medication Sig Dispense Refill  . albuterol (PROVENTIL HFA;VENTOLIN HFA) 108 (90 BASE) MCG/ACT inhaler Inhale into the lungs every 6 (six) hours as needed for wheezing.    . carvedilol (COREG) 25 MG tablet Take 25 mg by mouth daily.     . clobetasol cream (TEMOVATE) 9.41 % Apply 1 application topically 2 (two) times daily.    . hydrALAZINE (APRESOLINE) 50 MG tablet Take 50 mg by mouth every 8 (eight) hours.    . isosorbide mononitrate (IMDUR) 30 MG 24 hr tablet Take 30 mg by mouth daily.    Marland Kitchen lisinopril (PRINIVIL,ZESTRIL) 20 MG tablet Take 1 tablet by mouth daily.    Marland Kitchen  metolazone (ZAROXOLYN) 5 MG tablet Take 5 mg by mouth 2 (two) times daily as needed. Reported on 10/05/2015    . ondansetron (ZOFRAN ODT) 8 MG disintegrating tablet Take 1 tablet (8 mg total) by mouth every 8 (eight) hours as needed for nausea or vomiting. 20 tablet 0  . pravastatin (PRAVACHOL) 80 MG tablet Take 80 mg by mouth daily.    . raloxifene (EVISTA) 60 MG tablet Take 60 mg by mouth daily.    Marland Kitchen RAYALDEE 30 MCG CPCR     . sodium bicarbonate 650 MG tablet Take 650 mg by mouth 3 (three) times daily.    Marland Kitchen torsemide (DEMADEX) 20 MG tablet Take 20 mg by mouth daily.     No current facility-administered medications for this visit.     REVIEW OF SYSTEMS:  [X]  denotes positive finding, [ ]  denotes negative finding Cardiac  Comments:  Chest pain or chest pressure:    Shortness of breath upon exertion:    Short of breath when lying flat:    Irregular heart rhythm:        Vascular    Pain in calf, thigh, or hip brought on by ambulation:    Pain in  feet at night that wakes you up from your sleep:     Blood clot in your veins:    Leg swelling:         Pulmonary    Oxygen at home:    Productive cough:     Wheezing:  X       Neurologic    Sudden weakness in arms or legs:     Sudden numbness in arms or legs:     Sudden onset of difficulty speaking or slurred speech:    Temporary loss of vision in one eye:     Problems with dizziness:         Gastrointestinal    Blood in stool:     Vomited blood:         Genitourinary    Burning when urinating:     Blood in urine:        Psychiatric    Major depression:         Hematologic    Bleeding problems:    Problems with blood clotting too easily:        Skin    Rashes or ulcers:        Constitutional    Fever or chills:     PHYSICAL EXAM:   Vitals:   05/22/17 0902 05/22/17 0903  BP: (!) 150/74 (!) 156/73  Pulse: 67   Resp: 18   Temp: (!) 97.1 F (36.2 C)   TempSrc: Oral   SpO2: 100%   Weight: 240 lb (108.9 kg)   Height: 5\' 3"  (1.6 m)     GENERAL: The patient is a well-nourished female, in no acute distress. The vital signs are documented above. CARDIAC: There is a regular rate and rhythm.  VASCULAR: She has a palpable brachial and radial pulse bilaterally. She has a nonfunctioning left upper arm graft. PULMONARY: There is good air exchange bilaterally without wheezing or rales. ABDOMEN: Soft and non-tender with normal pitched bowel sounds.  MUSCULOSKELETAL: There are no major deformities or cyanosis. NEUROLOGIC: No focal weakness or paresthesias are detected. SKIN: There are no ulcers or rashes noted. PSYCHIATRIC: The patient has a normal affect.  DATA:    VEIN MAP: I reviewed the vein map that was done on 05/10/2017.  This shows that  the forearm cephalic vein is small.  The cephalic vein at the antecubital level and distal upper arm could not be visualized.  The basilic vein was marginal in size.  MEDICAL ISSUES:   STAGE IV CHRONIC KIDNEY DISEASE: This  patient is nearing dialysis and we have been asked to place a new access.  Given that she has an upper arm graft on the left that goes fairly high I would recommend placing access in the right arm.  It does not appear that she is a candidate for a forearm or upper arm cephalic vein fistula but could potentially have a basilic vein transposition although I think the chance of this is fairly small.  More likely she will require placement of an AV graft.  We have discussed the indications for the procedure and the potential complications and she is agreeable to proceed.  If she can get a ride were trying to schedule this for 05/24/2017.  [name] [facility] [phone]

## 2017-05-23 ENCOUNTER — Encounter (HOSPITAL_COMMUNITY): Payer: Self-pay | Admitting: Vascular Surgery

## 2017-05-23 ENCOUNTER — Other Ambulatory Visit: Payer: Self-pay

## 2017-05-23 MED ORDER — ACETAMINOPHEN 500 MG PO TABS
ORAL_TABLET | ORAL | Status: AC
  Filled 2017-05-23: qty 2

## 2017-05-23 NOTE — Progress Notes (Signed)
Anesthesia Chart Review: SAME DAY WORK-UP.  Patient is a 62 year old female scheduled for right arm AVF versus AVGG of 05/24/17 by Dr. [name]. She is not currently on hemodialysis.  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 - Hospitalized [facility] 12/2011 with acute respiratory failure with CAP and diagnosed with new acute systolic CHF with "concurrent STEMI vs. Acute myocarditis (takotsubo vs. Acute myocarditis)". Cardiac cath showed normal coronaries. Follow-up echo showed recovery of EF. BMI is consistent with morbid obesity.  - PCP is Dr. Jerene Bears. - Nephrologist is Dr. Fran Lowes. - Hematologist is Dr. Trisha Mangle, last visit 02/18/17. - Cardiologist is Dr. Kate Sable, last visit 04/21/13 in consultation for follow-up CHF. Echo repeated and showed normalization of her EF.   Meds include albuterol, Coreg, hydralazine, Imdur, Zaroxolyn, pravastatin, Evista, Rayaldee, sodium bicarbonate, torsemide.  Echo 04/29/13: Study Conclusions - Left ventricle: The cavity size was normal. Wall thickness was increased in a pattern of mild LVH. Systolic function was normal. The estimated ejection fraction was in the range of 60% to 65%. Wall motion was normal; there were no regional wall motion abnormalities. Features are consistent with a pseudonormal left ventricular filling pattern, with concomitant abnormal relaxation and increased filling pressure (grade 2 diastolic dysfunction). - Aortic valve: Mildly calcified annulus. Trileaflet; mildly thickened, mildly calcified leaflets. No significant regurgitation. Mean gradient: 71mm Hg (S). VTI ratio of LVOT to aortic valve: 0.75. Valve area: 1.7cm^2(VTI). - Mitral valve: Mildly thickened leaflets . Trivial regurgitation. - Left atrium: The atrium was mildly dilated. - Right  atrium: Central venous pressure: 25mm Hg (est). - Tricuspid valve: Mild regurgitation. - Pulmonic valve: The valve appears to be grossly normal. Transvalvular velocity was mildlyincreased. Cannot exclude a very mild degree of infundibular stenosis. Trivial regurgitation. Peak gradient: 60mm Hg (S). - Pulmonary arteries: PA peak pressure: 92mm Hg (S). - Pericardium, extracardiac: There was no pericardial effusion. Impressions: - Unable to compare with prior outside study from March 2012. There is mild LVH with LVEF 55-73%, grade 2 diastolic dysfunction. Mild left atrial enlargement. Mildly thickened mitral leaflets with trivial mitral regurgitation. Aortic valve appears sclerotic, although with adequate cusp excursion. Transvalvular velocity is mildly increased, mean gradient 15 mm mercury. Mild tricuspid regurgitation with PASP 23 mm mercury. The trans-pulmonic gradient is also mildly increased, question very mild degree of infundibular stenosis - doubt major clinical significance.  Cardiac cath 01/18/12 ([facility]): - Normal coronary arteries. Right dominance. - Mildly increased filling pressures.  (EF by 01/08/12 echo 22%, [facility].)  She is a same day work-up. Based on currently available records, she will need labs and new EKG. Anesthesiologist will need to evaluate on the day of surgery to ensure no acute CHF. Last EF was normal. She is on b-blocker therapy.  George Hugh [facility] Short Stay Center/Anesthesiology Phone [phone] 05/23/2017 12:19 PM

## 2017-05-23 NOTE — Progress Notes (Signed)
Pt denies SOB, chest pain, and being under the care of a cardiologist. Pt denies having a stress test. Pt denies having an EKG and chest x ray within the last year.Pt made aware to stop taking otc vitamins, fish oil and herbal medications. Do not take any NSAIDs ie: Ibuprofen, Advil, Naproxen (Aleve), Motrin, BC and Goody Powder. Pt verbalized understanding of all pre-op instructions. Anesthesia asked to review pt history (see note).

## 2017-05-24 ENCOUNTER — Ambulatory Visit (HOSPITAL_COMMUNITY): Payer: Medicare Other | Admitting: Vascular Surgery

## 2017-05-24 ENCOUNTER — Encounter (HOSPITAL_COMMUNITY)
Admission: RE | Disposition: A | Discharge status: Home / Self Care | Payer: Self-pay | Source: Ambulatory Visit | Attending: Vascular Surgery

## 2017-05-24 ENCOUNTER — Other Ambulatory Visit: Payer: Self-pay

## 2017-05-24 ENCOUNTER — Ambulatory Visit (HOSPITAL_COMMUNITY)
Admission: RE | Admit: 2017-05-24 | Discharge: 2017-05-24 | Disposition: A | Discharge status: Home / Self Care | Payer: Medicare Other | Source: Ambulatory Visit | Attending: Vascular Surgery | Admitting: Vascular Surgery

## 2017-05-24 DIAGNOSIS — Z79899 Other long term (current) drug therapy: Secondary | ICD-10-CM | POA: Insufficient documentation

## 2017-05-24 DIAGNOSIS — I130 Hypertensive heart and chronic kidney disease with heart failure and stage 1 through stage 4 chronic kidney disease, or unspecified chronic kidney disease: Secondary | ICD-10-CM | POA: Insufficient documentation

## 2017-05-24 DIAGNOSIS — E039 Hypothyroidism, unspecified: Secondary | ICD-10-CM | POA: Diagnosis not present

## 2017-05-24 DIAGNOSIS — N184 Chronic kidney disease, stage 4 (severe): Secondary | ICD-10-CM | POA: Diagnosis not present

## 2017-05-24 DIAGNOSIS — Z888 Allergy status to other drugs, medicaments and biological substances status: Secondary | ICD-10-CM | POA: Insufficient documentation

## 2017-05-24 DIAGNOSIS — I700 Atherosclerosis of aorta: Secondary | ICD-10-CM | POA: Diagnosis not present

## 2017-05-24 DIAGNOSIS — E785 Hyperlipidemia, unspecified: Secondary | ICD-10-CM | POA: Diagnosis not present

## 2017-05-24 DIAGNOSIS — E669 Obesity, unspecified: Secondary | ICD-10-CM | POA: Insufficient documentation

## 2017-05-24 DIAGNOSIS — J45909 Unspecified asthma, uncomplicated: Secondary | ICD-10-CM | POA: Diagnosis not present

## 2017-05-24 DIAGNOSIS — Z8719 Personal history of other diseases of the digestive system: Secondary | ICD-10-CM | POA: Insufficient documentation

## 2017-05-24 DIAGNOSIS — I509 Heart failure, unspecified: Secondary | ICD-10-CM | POA: Diagnosis not present

## 2017-05-24 DIAGNOSIS — Z8711 Personal history of peptic ulcer disease: Secondary | ICD-10-CM | POA: Insufficient documentation

## 2017-05-24 DIAGNOSIS — Z6841 Body Mass Index (BMI) 40.0 and over, adult: Secondary | ICD-10-CM | POA: Diagnosis not present

## 2017-05-24 DIAGNOSIS — I081 Rheumatic disorders of both mitral and tricuspid valves: Secondary | ICD-10-CM | POA: Insufficient documentation

## 2017-05-24 DIAGNOSIS — E8889 Other specified metabolic disorders: Secondary | ICD-10-CM | POA: Insufficient documentation

## 2017-05-24 DIAGNOSIS — F17210 Nicotine dependence, cigarettes, uncomplicated: Secondary | ICD-10-CM | POA: Insufficient documentation

## 2017-05-24 HISTORY — DX: Other cardiomyopathies: I42.8

## 2017-05-24 HISTORY — PX: AV FISTULA PLACEMENT: SHX1204

## 2017-05-24 LAB — GLUCOSE, CAPILLARY: Glucose-Capillary: 91 mg/dL (ref 65–99)

## 2017-05-24 LAB — POCT I-STAT 4, (NA,K, GLUC, HGB,HCT)
Glucose, Bld: 89 mg/dL (ref 65–99)
HEMATOCRIT: 31 % — AB (ref 36.0–46.0)
HEMOGLOBIN: 10.5 g/dL — AB (ref 12.0–15.0)
POTASSIUM: 5.3 mmol/L — AB (ref 3.5–5.1)
Sodium: 141 mmol/L (ref 135–145)

## 2017-05-24 SURGERY — INSERTION OF ARTERIOVENOUS (AV) GORE-TEX GRAFT ARM
Anesthesia: Monitor Anesthesia Care | Site: Arm Lower | Laterality: Right

## 2017-05-24 MED ORDER — OXYCODONE HCL 5 MG/5ML PO SOLN: 5.0000 mg | Freq: Once | ORAL | Status: DC | PRN

## 2017-05-24 MED ORDER — OXYCODONE HCL 5 MG PO TABS: 5.0000 mg | ORAL_TABLET | Freq: Once | ORAL | Status: DC | PRN

## 2017-05-24 MED ORDER — CHLORHEXIDINE GLUCONATE 4 % EX LIQD: 60.0000 mL | Freq: Once | CUTANEOUS | Status: DC

## 2017-05-24 MED ORDER — PROPOFOL 10 MG/ML IV BOLUS
INTRAVENOUS | Status: DC | PRN
  Administered 2017-05-24: 30 mg via INTRAVENOUS

## 2017-05-24 MED ORDER — LIDOCAINE HCL (PF) 1 % IJ SOLN
INTRAMUSCULAR | Status: AC
Start: 2017-05-24 — End: ?
  Filled 2017-05-24: qty 30

## 2017-05-24 MED ORDER — FENTANYL CITRATE (PF) 100 MCG/2ML IJ SOLN: 25.0000 ug | INTRAMUSCULAR | Status: DC | PRN

## 2017-05-24 MED ORDER — PHENYLEPHRINE HCL 10 MG/ML IJ SOLN
INTRAMUSCULAR | Status: DC | PRN
  Administered 2017-05-24 (×2): 80 ug via INTRAVENOUS
  Administered 2017-05-24: 120 ug via INTRAVENOUS
  Administered 2017-05-24 (×2): 80 ug via INTRAVENOUS

## 2017-05-24 MED ORDER — PROTAMINE SULFATE 10 MG/ML IV SOLN
INTRAVENOUS | Status: DC | PRN
  Administered 2017-05-24: 40 mg via INTRAVENOUS

## 2017-05-24 MED ORDER — FENTANYL CITRATE (PF) 100 MCG/2ML IJ SOLN
INTRAMUSCULAR | Status: DC | PRN
  Administered 2017-05-24: 50 ug via INTRAVENOUS

## 2017-05-24 MED ORDER — SODIUM CHLORIDE 0.9 % IV SOLN
INTRAVENOUS | Status: DC
  Administered 2017-05-24 (×3): via INTRAVENOUS

## 2017-05-24 MED ORDER — MIDAZOLAM HCL 2 MG/2ML IJ SOLN
INTRAMUSCULAR | Status: DC | PRN
  Administered 2017-05-24: 2 mg via INTRAVENOUS

## 2017-05-24 MED ORDER — PROPOFOL 500 MG/50ML IV EMUL
INTRAVENOUS | Status: DC | PRN
  Administered 2017-05-24: 100 ug/kg/min via INTRAVENOUS

## 2017-05-24 MED ORDER — OXYCODONE-ACETAMINOPHEN 5-325 MG PO TABS: 1.0000 | ORAL_TABLET | Freq: Four times a day (QID) | ORAL | 0 refills | Status: AC | PRN

## 2017-05-24 MED ORDER — PAPAVERINE HCL 30 MG/ML IJ SOLN
INTRAMUSCULAR | Status: AC
  Filled 2017-05-24: qty 2

## 2017-05-24 MED ORDER — LIDOCAINE HCL (PF) 1 % IJ SOLN
INTRAMUSCULAR | Status: AC
  Filled 2017-05-24: qty 30

## 2017-05-24 MED ORDER — HEPARIN SODIUM (PORCINE) 1000 UNIT/ML IJ SOLN
INTRAMUSCULAR | Status: DC | PRN
  Administered 2017-05-24: 9000 [IU] via INTRAVENOUS

## 2017-05-24 MED ORDER — LIDOCAINE HCL (PF) 1 % IJ SOLN
INTRAMUSCULAR | Status: DC | PRN
  Administered 2017-05-24 (×2): 30 mL

## 2017-05-24 MED ORDER — LIDOCAINE HCL (CARDIAC) 20 MG/ML IV SOLN
INTRAVENOUS | Status: DC | PRN
  Administered 2017-05-24: 100 mg via INTRAVENOUS

## 2017-05-24 MED ORDER — MIDAZOLAM HCL 2 MG/2ML IJ SOLN
INTRAMUSCULAR | Status: AC
  Filled 2017-05-24: qty 2

## 2017-05-24 MED ORDER — ONDANSETRON HCL 4 MG/2ML IJ SOLN
INTRAMUSCULAR | Status: DC | PRN
  Administered 2017-05-24: 4 mg via INTRAVENOUS

## 2017-05-24 MED ORDER — DEXTROSE 5 % IV SOLN
1.5000 g | INTRAVENOUS | Status: AC
  Administered 2017-05-24: 1.5 g via INTRAVENOUS
  Filled 2017-05-24: qty 1.5

## 2017-05-24 MED ORDER — 0.9 % SODIUM CHLORIDE (POUR BTL) OPTIME
TOPICAL | Status: DC | PRN
  Administered 2017-05-24: 1000 mL

## 2017-05-24 MED ORDER — FENTANYL CITRATE (PF) 250 MCG/5ML IJ SOLN
INTRAMUSCULAR | Status: AC
  Filled 2017-05-24: qty 5

## 2017-05-24 MED ORDER — LIDOCAINE-EPINEPHRINE (PF) 1 %-1:200000 IJ SOLN
INTRAMUSCULAR | Status: AC
  Filled 2017-05-24: qty 30

## 2017-05-24 MED ORDER — SODIUM CHLORIDE 0.9 % IV SOLN
INTRAVENOUS | Status: DC | PRN
  Administered 2017-05-24: 500 mL

## 2017-05-24 SURGICAL SUPPLY — 31 items
ARMBAND PINK RESTRICT EXTREMIT (MISCELLANEOUS) ×6 IMPLANT
CANISTER SUCT 3000ML PPV (MISCELLANEOUS) ×3 IMPLANT
CANNULA VESSEL 3MM 2 BLNT TIP (CANNULA) ×3 IMPLANT
CLIP VESOCCLUDE MED 6/CT (CLIP) ×3 IMPLANT
CLIP VESOCCLUDE SM WIDE 6/CT (CLIP) ×3 IMPLANT
COVER PROBE W GEL 5X96 (DRAPES) IMPLANT
DECANTER SPIKE VIAL GLASS SM (MISCELLANEOUS) ×3 IMPLANT
DERMABOND ADVANCED (GAUZE/BANDAGES/DRESSINGS) ×1
DERMABOND ADVANCED .7 DNX12 (GAUZE/BANDAGES/DRESSINGS) ×2 IMPLANT
ELECT REM PT RETURN 9FT ADLT (ELECTROSURGICAL) ×3
ELECTRODE REM PT RTRN 9FT ADLT (ELECTROSURGICAL) ×2 IMPLANT
GLOVE BIO SURGEON STRL SZ7.5 (GLOVE) ×3 IMPLANT
GLOVE BIOGEL PI IND STRL 8 (GLOVE) ×2 IMPLANT
GLOVE BIOGEL PI INDICATOR 8 (GLOVE) ×1
GOWN STRL REUS W/ TWL LRG LVL3 (GOWN DISPOSABLE) ×6 IMPLANT
GOWN STRL REUS W/TWL LRG LVL3 (GOWN DISPOSABLE) ×3
GRAFT GORETEX STRT 4-7X45 (Vascular Products) ×3 IMPLANT
KIT BASIN OR (CUSTOM PROCEDURE TRAY) ×3 IMPLANT
KIT ROOM TURNOVER OR (KITS) ×3 IMPLANT
NS IRRIG 1000ML POUR BTL (IV SOLUTION) ×3 IMPLANT
PACK CV ACCESS (CUSTOM PROCEDURE TRAY) ×3 IMPLANT
PAD ARMBOARD 7.5X6 YLW CONV (MISCELLANEOUS) ×6 IMPLANT
SLEEVE SURGEON STRL (DRAPES) ×3 IMPLANT
SPONGE SURGIFOAM ABS GEL 100 (HEMOSTASIS) IMPLANT
SUT PROLENE 6 0 BV (SUTURE) ×9 IMPLANT
SUT VIC AB 3-0 SH 27 (SUTURE) ×1
SUT VIC AB 3-0 SH 27X BRD (SUTURE) ×2 IMPLANT
SUT VICRYL 4-0 PS2 18IN ABS (SUTURE) ×3 IMPLANT
TOWEL GREEN STERILE (TOWEL DISPOSABLE) ×3 IMPLANT
UNDERPAD 30X30 (UNDERPADS AND DIAPERS) ×3 IMPLANT
WATER STERILE IRR 1000ML POUR (IV SOLUTION) ×3 IMPLANT

## 2017-05-24 NOTE — Transfer of Care (Signed)
Immediate Anesthesia Transfer of Care Note  Patient: Shannon [name]  Procedure(s) Performed: INSERTION OF ARTERIOVENOUS (AV) GORE-TEX GRAFT RIGHT ARM (Right Arm Lower)  Patient Location: PACU  Anesthesia Type:MAC  Level of Consciousness: awake, alert  and oriented  Airway & Oxygen Therapy: Patient Spontanous Breathing and Patient connected to face mask oxygen  Post-op Assessment: Report given to RN and Post -op Vital signs reviewed and stable  Post vital signs: Reviewed and stable  Last Vitals:  Vitals:   05/24/17 1247 05/24/17 1747  BP: (!) 118/47   Pulse: (!) 58   Resp: 20   Temp: 36.5 C 36.7 C    Last Pain:  Vitals:   05/24/17 1247  TempSrc: Oral      Patients Stated Pain Goal: 2 (41/74/08 1448)  Complications: No apparent anesthesia complications

## 2017-05-24 NOTE — Op Note (Signed)
    NAME: DYMON SUMMERHILL    MRN: [id number] DOB: [birth year]    DATE OF OPERATION: 05/24/2017  PREOP DIAGNOSIS:    Stage IV chronic kidney disease  POSTOP DIAGNOSIS:    Same  PROCEDURE:    Right forearm AV graft (4-7 mm PTFE graft)  SURGEON: Judeth Cornfield. Scot Dock, MD, FACS  ASSIST: Arlee Muslim PA  ANESTHESIA: Local with sedation  EBL: Minimal  INDICATIONS:    Shannon [name] is a 62 y.o. female who presents for new access.  FINDINGS:   Patient did not have an adequate vein for a fistula.  A forearm graft was placed.  The arterial aspect of the graft is along the lateral aspect of the forearm.  TECHNIQUE:   The patient was taken to the operating room and sedated by anesthesia.  The right forearm was prepped and draped in usual sterile fashion.  After the skin was anesthetized with 1% lidocaine and a longitudinal incision was made just above the antecubital level.  Here the brachial vein was dissected free was about a 3-1/2 mm vein.  The adjacent brachial artery was dissected free.  Using 1 distal counterincision, a 4-7 mm PTFE graft was tunneled in a loop fashion in the forearm with the arterial aspect of the graft along the lateral aspect of the forearm.  The patient was heparinized.  The brachial artery was clamped proximally and distally and a longitudinal arteriotomy was made.  A segment of the 4 mm the graft was excised, the graft slightly spatulated and sewn end to side to the artery using continuous 6-0 Prolene suture.  The graft was then pulled the appropriate length for anastomosis to the brachial vein.  The vein was ligated distally and spatulated proximally.  The graft was cut to the appropriate length, spatulated and sewn into into the vein using 2 continuous 6-0 Prolene sutures.  At the completion there was a good thrill in the graft.  There was a radial and ulnar signal with the Doppler.  There was a palpable radial pulse.  The heparin was partially reversed  with protamine.  Each of the wounds was closed with a deep layer of 3-0 Vicryl the skin closed with 4-0 Vicryl.  Dermabond was applied.  Patient tolerated the procedure well was transferred to the recovery room in stable condition.  All needle and sponge counts were correct.  [name], MD, FACS [facility]  DATE OF DICTATION:   05/24/2017

## 2017-05-24 NOTE — Discharge Instructions (Signed)
° °  [facility] [place] ° °Discharge Instructions ° °AV Fistula or Graft Surgery for Dialysis Access ° °Please refer to the following instructions for your post-procedure care. Your surgeon or physician assistant will discuss any changes with you. ° °Activity ° °You may drive the day following your surgery, if you are comfortable and no longer taking prescription pain medication. Resume full activity as the soreness in your incision resolves. ° °Bathing/Showering ° °You may shower after you go home. Keep your incision dry for 48 hours. Do not soak in a bathtub, hot tub, or swim until the incision heals completely. You may not shower if you have a hemodialysis catheter. ° °Incision Care ° °Clean your incision with mild soap and water after 48 hours. Pat the area dry with a clean towel. You do not need a bandage unless otherwise instructed. Do not apply any ointments or creams to your incision. You may have skin glue on your incision. Do not peel it off. It will come off on its own in about one week. Your arm may swell a bit after surgery. To reduce swelling use pillows to elevate your arm so it is above your heart. Your doctor will tell you if you need to lightly wrap your arm with an ACE bandage. ° °Diet ° °Resume your normal diet. There are not special food restrictions following this procedure. In order to heal from your surgery, it is CRITICAL to get adequate nutrition. Your body requires vitamins, minerals, and protein. Vegetables are the best source of vitamins and minerals. Vegetables also provide the perfect balance of protein. Processed food has little nutritional value, so try to avoid this. ° °Medications ° °Resume taking all of your medications. If your incision is causing pain, you may take over-the counter pain relievers such as acetaminophen (Tylenol). If you were prescribed a stronger pain medication, please be aware these medications can cause nausea and constipation. Prevent  nausea by taking the medication with a snack or meal. Avoid constipation by drinking plenty of fluids and eating foods with high amount of fiber, such as fruits, vegetables, and grains. Do not take Tylenol if you are taking prescription pain medications. ° ° ° ° °Follow up °Your surgeon may want to see you in the office following your access surgery. If so, this will be arranged at the time of your surgery. ° °Please call us immediately for any of the following conditions: ° °Increased pain, redness, drainage (pus) from your incision site °Fever of 101 degrees or higher °Severe or worsening pain at your incision site °Hand pain or numbness. ° °Reduce your risk of vascular disease: ° °Stop smoking. If you would like help, call [facility] at 1-800-QUIT-NOW (1-800-784-8669) or [place] at 336-586-4000 ° °Manage your cholesterol °Maintain a desired weight °Control your diabetes °Keep your blood pressure down ° °Dialysis ° °It will take several weeks to several months for your new dialysis access to be ready for use. Your surgeon will determine when it is OK to use it. Your nephrologist will continue to direct your dialysis. You can continue to use your Permcath until your new access is ready for use. ° °If you have any questions, please call the office at [phone]. ° °

## 2017-05-24 NOTE — Anesthesia Preprocedure Evaluation (Signed)
Anesthesia Evaluation  Patient identified by MRN, date of birth, ID band Patient awake    Reviewed: Allergy & Precautions, NPO status , Patient's Chart, lab work & pertinent test results, reviewed documented beta blocker date and time   History of Anesthesia Complications Negative for: history of anesthetic complications  Airway Mallampati: I  TM Distance: >3 FB Neck ROM: Full    Dental  (+) Edentulous Upper, Missing   Pulmonary asthma , Current Smoker,    breath sounds clear to auscultation       Cardiovascular hypertension, Pt. on medications and Pt. on home beta blockers +CHF   Rhythm:Regular     Neuro/Psych negative neurological ROS  negative psych ROS   GI/Hepatic Neg liver ROS, PUD,   Endo/Other    Renal/GU CRFRenal disease     Musculoskeletal   Abdominal   Peds  Hematology  (+) anemia ,   Anesthesia Other Findings Patient is a 62 year old female scheduled for right arm AVF versus AVGG of 05/24/17 by Dr. [name]. She is not currently on hemodialysis.  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 - Hospitalized [facility] 12/2011 with acute respiratory failure with CAP and diagnosed with new acute systolic CHF with "concurrent STEMI vs. Acute myocarditis (takotsubo vs. Acute myocarditis)". Cardiac cath showed normal coronaries. Follow-up echo showed recovery of EF. BMI is consistent with morbid obesity.  - PCP is Dr. Jerene Bears. - Nephrologist is Dr. Fran Lowes. - Hematologist is Dr. Trisha Mangle, last visit 02/18/17. - Cardiologist is Dr. Kate Sable, last visit 04/21/13 in consultation for follow-up CHF. Echo repeated and showed normalization of her EF.   Meds include albuterol, Coreg, hydralazine, Imdur, Zaroxolyn, pravastatin, Evista, Rayaldee, sodium bicarbonate,  torsemide.  Echo 04/29/13: Study Conclusions - Left ventricle: The cavity size was normal. Wall thickness was increased in a pattern of mild LVH. Systolic function was normal. The estimated ejection fraction was in the range of 60% to 65%. Wall motion was normal; there were no regional wall motion abnormalities. Features are consistent with a pseudonormal left ventricular filling pattern, with concomitant abnormal relaxation and increased filling pressure (grade 2 diastolic dysfunction). - Aortic valve: Mildly calcified annulus. Trileaflet; mildly thickened, mildly calcified leaflets. No significant regurgitation. Mean gradient: 52m Hg (S). VTI ratio of LVOT to aortic valve: 0.75. Valve area: 1.7cm^2(VTI). - Mitral valve: Mildly thickened leaflets . Trivial regurgitation. - Left atrium: The atrium was mildly dilated. - Right atrium: Central venous pressure: 364mHg (est). - Tricuspid valve: Mild regurgitation. - Pulmonic valve: The valve appears to be grossly normal. Transvalvular velocity was mildlyincreased. Cannot exclude a very mild degree of infundibular stenosis. Trivial regurgitation. Peak gradient: 1350mg (S). - Pulmonary arteries: PA peak pressure: 60m34m (S). - Pericardium, extracardiac: There was no pericardial effusion. Impressions: - Unable to compare with prior outside study from March 2012. There is mild LVH with LVEF 60-616-01%ade 2 diastolic dysfunction. Mild left atrial enlargement. Mildly thickened mitral leaflets with trivial mitral regurgitation. Aortic valve appears sclerotic, although with adequate cusp excursion. Transvalvular velocity is mildly increased, mean gradient 15 mm mercury. Mild tricuspid regurgitation with PASP 23 mm mercury. The trans-pulmonic gradient is also mildly increased, question very mild degree of infundibular stenosis - doubt major clinical significance.  Cardiac cath 01/18/12  ([place] Normal coronary arteries. Right dominance. - Mildly increased filling pressures.  (EF by 01/08/12 echo 22%, [place]  Reproductive/Obstetrics                             Anesthesia Physical Anesthesia Plan  ASA: III  Anesthesia Plan: MAC   Post-op Pain Management:    Induction: Intravenous  PONV Risk Score and Plan: 1  Airway Management Planned: Nasal Cannula  Additional Equipment:   Intra-op Plan:   Post-operative Plan:   Informed Consent: I have reviewed the patients History and Physical, chart, labs and discussed the procedure including the risks, benefits and alternatives for the proposed anesthesia with the patient or authorized representative who has indicated his/her understanding and acceptance.   Dental advisory given  Plan Discussed with: CRNA and Surgeon  Anesthesia Plan Comments:         Anesthesia Quick Evaluation

## 2017-05-24 NOTE — Interval H&P Note (Signed)
History and Physical Interval Note:  05/24/2017 3:15 PM  Shannon [name]  has presented today for surgery, with the diagnosis of chronic kidney disease  The various methods of treatment have been discussed with the patient and family. After consideration of risks, benefits and other options for treatment, the patient has consented to  Procedure(s): ARTERIOVENOUS (AV) FISTULA CREATION VERSUS GRAFT (Right) as a surgical intervention .  The patient's history has been reviewed, patient examined, no change in status, stable for surgery.  I have reviewed the patient's chart and labs.  Questions were answered to the patient's satisfaction.     [name]

## 2017-05-25 NOTE — Anesthesia Postprocedure Evaluation (Signed)
Anesthesia Post Note  Patient: Shannon [name]  Procedure(s) Performed: INSERTION OF ARTERIOVENOUS (AV) GORE-TEX GRAFT RIGHT ARM (Right Arm Lower)     Patient location during evaluation: PACU Anesthesia Type: MAC Level of consciousness: awake and alert Pain management: pain level controlled Vital Signs Assessment: post-procedure vital signs reviewed and stable Respiratory status: spontaneous breathing, nonlabored ventilation, respiratory function stable and patient connected to nasal cannula oxygen Cardiovascular status: stable and blood pressure returned to baseline Postop Assessment: no apparent nausea or vomiting Anesthetic complications: no    Last Vitals:  Vitals:   05/24/17 1817 05/24/17 1820  BP: (!) 120/58 (!) 120/58  Pulse: (!) 58   Resp: 19   Temp:  36.7 C  SpO2: 94%     Last Pain:  Vitals:   05/24/17 1820  TempSrc:   PainSc: 0-No pain                 Camesha Farooq

## 2017-05-27 ENCOUNTER — Telehealth: Payer: Self-pay | Admitting: *Deleted

## 2017-05-27 ENCOUNTER — Encounter (HOSPITAL_COMMUNITY): Payer: Self-pay | Admitting: Vascular Surgery

## 2017-05-27 ENCOUNTER — Telehealth: Payer: Self-pay | Admitting: Vascular Surgery

## 2017-05-27 NOTE — Telephone Encounter (Signed)
Patient called requesting pain medication "About to run out". Informed patient she would have to be seen to get a prescription pain medication. Instructed to elevate arm above level of her heart and could try Tylenol  ES for pain. Wiggle  Fingers to improve blood flow  and call back for worsening condition.

## 2017-05-27 NOTE — Telephone Encounter (Signed)
-----   Message from Mena Goes, RN sent at 05/25/2017  1:36 PM EST ----- Regarding: 2-3 weeks post loop AVG   ----- Message ----- From: Iline Oven Sent: 05/24/2017   5:18 PM To: [facility] Charge Pool  Can you schedule an appt for this pt with Dr. Scot Dock in about 2-3 weeks.  PO R forearm loop AV graft

## 2017-05-27 NOTE — Telephone Encounter (Signed)
Sched appt 06/19/17 at 11:45. Spoke to pt.

## 2017-06-19 ENCOUNTER — Encounter: Payer: Self-pay | Admitting: Vascular Surgery

## 2017-06-19 ENCOUNTER — Ambulatory Visit (INDEPENDENT_AMBULATORY_CARE_PROVIDER_SITE_OTHER): Payer: Medicare Other | Admitting: Vascular Surgery

## 2017-06-19 VITALS — BP 142/75 | HR 69 | Temp 97.4°F | Resp 20 | Ht 63.0 in | Wt 235.0 lb

## 2017-06-19 DIAGNOSIS — Z48812 Encounter for surgical aftercare following surgery on the circulatory system: Secondary | ICD-10-CM

## 2017-06-19 NOTE — Progress Notes (Signed)
   Patient name: Shannon [name] MRN: [id number] DOB: 1955-08-07 Sex: female  REASON FOR VISIT:   Follow-up  HPI:   Shannon [name] is a pleasant 62 y.o. female who presented for new access.  She had a right forearm graft placed on 05/24/2017.  She comes in for routine follow-up visit.  She has no specific complaints.  She is not on dialysis yet.  Current Outpatient Medications  Medication Sig Dispense Refill  . acetaminophen (TYLENOL) 500 MG tablet Take 1,000 mg by mouth daily as needed for moderate pain or headache.    . albuterol (PROVENTIL HFA;VENTOLIN HFA) 108 (90 BASE) MCG/ACT inhaler Inhale 1-2 puffs into the lungs every 6 (six) hours as needed for wheezing.     . carvedilol (COREG) 25 MG tablet Take 25 mg by mouth 2 (two) times daily.     . clobetasol cream (TEMOVATE) 4.81 % Apply 1 application topically daily as needed (rash).     . hydrALAZINE (APRESOLINE) 50 MG tablet Take 50 mg by mouth every 8 (eight) hours.    . isosorbide mononitrate (IMDUR) 30 MG 24 hr tablet Take 30 mg by mouth daily.    . metolazone (ZAROXOLYN) 5 MG tablet Take 5 mg by mouth 2 (two) times daily as needed (swelling).     . pravastatin (PRAVACHOL) 80 MG tablet Take 80 mg by mouth daily.    . raloxifene (EVISTA) 60 MG tablet Take 60 mg by mouth daily.    Marland Kitchen RAYALDEE 30 MCG CPCR Take 30 mcg by mouth at bedtime.     . sodium bicarbonate 650 MG tablet Take 1,300 mg by mouth 3 (three) times daily.     Marland Kitchen torsemide (DEMADEX) 20 MG tablet Take 20 mg by mouth daily as needed (swelling).      No current facility-administered medications for this visit.     REVIEW OF SYSTEMS:  [X]  denotes positive finding, [ ]  denotes negative finding Cardiac  Comments:  Chest pain or chest pressure:    Shortness of breath upon exertion:    Short of breath when lying flat:    Irregular heart rhythm:    Constitutional    Fever or chills:     PHYSICAL EXAM:   Vitals:   06/19/17 1254  BP: (!) 142/75  Pulse: 69  Resp:  20  Temp: (!) 97.4 F (36.3 C)  TempSrc: Oral  SpO2: 97%  Weight: 235 lb (106.6 kg)  Height: 5\' 3"  (1.6 m)    GENERAL: The patient is a well-nourished female, in no acute distress. The vital signs are documented above. CARDIOVASCULAR: There is a regular rate and rhythm. PULMONARY: There is good air exchange bilaterally without wheezing or rales. Her right forearm graft has an excellent thrill. She has a palpable right radial pulse. Her incisions are healing nicely.  DATA:   No new data  MEDICAL ISSUES:   STATUS POST RIGHT FOREARM AV GRAFT: Her graft is working well.  This can be used 4 weeks postop.  She has no steal symptoms.  I will see her back as needed.  [name] [facility] [phone]

## 2017-06-20 ENCOUNTER — Other Ambulatory Visit (HOSPITAL_COMMUNITY): Payer: Self-pay | Admitting: *Deleted

## 2017-06-20 DIAGNOSIS — N189 Chronic kidney disease, unspecified: Secondary | ICD-10-CM

## 2017-06-20 DIAGNOSIS — D631 Anemia in chronic kidney disease: Secondary | ICD-10-CM

## 2017-06-21 ENCOUNTER — Inpatient Hospital Stay (HOSPITAL_BASED_OUTPATIENT_CLINIC_OR_DEPARTMENT_OTHER): Payer: Medicare Other | Admitting: Internal Medicine

## 2017-06-21 ENCOUNTER — Other Ambulatory Visit: Payer: Self-pay

## 2017-06-21 ENCOUNTER — Inpatient Hospital Stay (HOSPITAL_COMMUNITY): Payer: Medicare Other

## 2017-06-21 ENCOUNTER — Encounter (HOSPITAL_COMMUNITY): Payer: Self-pay | Admitting: Internal Medicine

## 2017-06-21 ENCOUNTER — Inpatient Hospital Stay (HOSPITAL_COMMUNITY): Payer: Medicare Other | Attending: Oncology

## 2017-06-21 ENCOUNTER — Encounter (HOSPITAL_COMMUNITY): Payer: Self-pay

## 2017-06-21 VITALS — BP 122/57 | HR 66 | Temp 98.8°F | Resp 18 | Ht 63.0 in | Wt 237.4 lb

## 2017-06-21 DIAGNOSIS — D631 Anemia in chronic kidney disease: Secondary | ICD-10-CM

## 2017-06-21 DIAGNOSIS — N189 Chronic kidney disease, unspecified: Secondary | ICD-10-CM | POA: Diagnosis not present

## 2017-06-21 DIAGNOSIS — N184 Chronic kidney disease, stage 4 (severe): Secondary | ICD-10-CM

## 2017-06-21 LAB — CBC WITH DIFFERENTIAL/PLATELET
BASOS ABS: 0 10*3/uL (ref 0.0–0.1)
BASOS PCT: 0 %
Eosinophils Absolute: 0.2 10*3/uL (ref 0.0–0.7)
Eosinophils Relative: 2 %
HEMATOCRIT: 28.1 % — AB (ref 36.0–46.0)
HEMOGLOBIN: 8.9 g/dL — AB (ref 12.0–15.0)
Lymphocytes Relative: 19 %
Lymphs Abs: 1.8 10*3/uL (ref 0.7–4.0)
MCH: 31.2 pg (ref 26.0–34.0)
MCHC: 31.7 g/dL (ref 30.0–36.0)
MCV: 98.6 fL (ref 78.0–100.0)
Monocytes Absolute: 0.4 10*3/uL (ref 0.1–1.0)
Monocytes Relative: 4 %
NEUTROS PCT: 75 %
Neutro Abs: 6.9 10*3/uL (ref 1.7–7.7)
Platelets: 213 10*3/uL (ref 150–400)
RBC: 2.85 MIL/uL — AB (ref 3.87–5.11)
RDW: 14.1 % (ref 11.5–15.5)
WBC: 9.2 10*3/uL (ref 4.0–10.5)

## 2017-06-21 LAB — COMPREHENSIVE METABOLIC PANEL
ALK PHOS: 68 U/L (ref 38–126)
ALT: 13 U/L — ABNORMAL LOW (ref 14–54)
AST: 17 U/L (ref 15–41)
Albumin: 3.4 g/dL — ABNORMAL LOW (ref 3.5–5.0)
Anion gap: 11 (ref 5–15)
BILIRUBIN TOTAL: 0.5 mg/dL (ref 0.3–1.2)
BUN: 39 mg/dL — AB (ref 6–20)
CALCIUM: 8.8 mg/dL — AB (ref 8.9–10.3)
CO2: 19 mmol/L — ABNORMAL LOW (ref 22–32)
Chloride: 111 mmol/L (ref 101–111)
Creatinine, Ser: 4.06 mg/dL — ABNORMAL HIGH (ref 0.44–1.00)
GFR calc Af Amer: 13 mL/min — ABNORMAL LOW (ref >60)
GFR calc non Af Amer: 11 mL/min — ABNORMAL LOW (ref >60)
GLUCOSE: 97 mg/dL (ref 65–99)
POTASSIUM: 3.6 mmol/L (ref 3.5–5.1)
Sodium: 141 mmol/L (ref 135–145)
TOTAL PROTEIN: 6.7 g/dL (ref 6.5–8.1)

## 2017-06-21 MED ORDER — DARBEPOETIN ALFA 150 MCG/0.3ML IJ SOSY
150.0000 ug | PREFILLED_SYRINGE | Freq: Once | INTRAMUSCULAR | Status: AC
  Administered 2017-06-21: 150 ug via SUBCUTANEOUS

## 2017-06-21 MED ORDER — DARBEPOETIN ALFA 150 MCG/0.3ML IJ SOSY
PREFILLED_SYRINGE | INTRAMUSCULAR | Status: AC
  Filled 2017-06-21: qty 0.3

## 2017-06-21 NOTE — Progress Notes (Signed)
Shannon [name] tolerated Aranesp injection well without complaints or incident. Hgb 8.9 today. Pt seen by Dr. Sherrine Maples and Aranesp injection will be changed to every 3 weeks. VSS Pt discharged self ambulatory in satisfactory condition

## 2017-06-21 NOTE — Progress Notes (Signed)
Anemia of chronic kidney disease returns for follow-up.   She has been on Aranesp 150 mcg every 4 weeks.   Her last Aranesp dose was 05/21/2017.    She feels well with no evidence of external bleeding no change in bowel habits melena hematochezia.  She denies excess fatigue.Continues to follow with nephrology for chronic kidney disease which has been stable she is not oliguric.  No congestive heart failure.   She has had a history of hypertension that has been difficult to control but lately her medications have been optimized and today her blood pressure remains normal.   No history of arterial venous thrombotic events since her last visit.  Hemoglobin went up to 10.5 after her last dose of Aranesp a week later but then today it has dropped back down to 8.9.   Iron panel today is pending.  However there appears to be no clear signs of bleeding.    Therefore I have opted to keep the dose at 150 mcg of Aranesp but reduce the interval to be given every 3 weeks instead of every 4 weeks.  She agreed to the plan.  We will continue to monitor her blood pressure and her counts.   Plan is to target a hemoglobin of about 10.  And use minimal dose necessary to maintain her hemoglobin at that level while watching her BP.  We will contact her with results of iron panel when available replace iron if necessary  We will put in order for CBC every 3 weeks and iron panel and B12 q. 12 weeks.   She will return for an Aranesp in 3 weeks,  MD visit in 6 months as long as she continues to show adequate response.

## 2017-06-21 NOTE — Patient Instructions (Signed)
[facility] at [facility] Discharge Instructions  RECOMMENDATIONS MADE BY THE CONSULTANT AND ANY TEST RESULTS WILL BE SENT TO YOUR REFERRING PHYSICIAN.  Received Aranesp injection today. Follow-up as scheduled. Call clinic for any questions or concerns  Thank you for choosing [facility] at [facility] to provide your oncology and hematology care.  To afford each patient quality time with our provider, please arrive at least 15 minutes before your scheduled appointment time.    If you have a lab appointment with the Cancer Center please come in thru the  Main Entrance and check in at the main information desk  You need to re-schedule your appointment should you arrive 10 or more minutes late.  We strive to give you quality time with our providers, and arriving late affects you and other patients whose appointments are after yours.  Also, if you no show three or more times for appointments you may be dismissed from the clinic at the providers discretion.     Again, thank you for choosing [facility].  Our hope is that these requests will decrease the amount of time that you wait before being seen by our physicians.       _____________________________________________________________  Should you have questions after your visit to [facility], please contact our office at [phone] between the hours of 8:30 a.m. and 4:30 p.m.  Voicemails left after 4:30 p.m. will not be returned until the following business day.  For prescription refill requests, have your pharmacy contact our office.       Resources For Cancer Patients and their Caregivers ? American Cancer Society: Can assist with transportation, wigs, general needs, runs Look Good Feel Better.        1-888-227-6333 ? Cancer Care: Provides financial assistance, online support groups, medication/co-pay assistance.  1-800-813-HOPE (4673) ? [facility] Assists [place] cancer patients and their families through emotional , educational and financial support.  336-427-4357 ? [place] DSS Where to apply for food stamps, Medicaid and utility assistance. 336-342-1394 ? [facility]: Transportation to medical appointments. 336-347-2287 ? Social Security Administration: May apply for disability if have a Stage IV cancer. 336-342-7796 1-800-772-1213 ? [place] Aging, Disability and Transit Services: Assists with nutrition, care and transit needs. 336-349-2343  Cancer Center Support Programs: @10RELATIVEDAYS@ > Cancer Support Group  2nd Tuesday of the month 1pm-2pm, Journey Room  > Creative Journey  3rd Tuesday of the month 1130am-1pm, Journey Room  > Look Good Feel Better  1st Wednesday of the month 10am-12 noon, Journey Room (Call American Cancer Society to register 1-800-395-5775)   

## 2017-06-22 LAB — IRON AND TIBC
IRON: 31 ug/dL (ref 28–170)
Saturation Ratios: 16 % (ref 10.4–31.8)
TIBC: 197 ug/dL — AB (ref 250–450)
UIBC: 166 ug/dL

## 2017-06-22 LAB — FERRITIN: FERRITIN: 347 ng/mL — AB (ref 11–307)

## 2017-07-12 ENCOUNTER — Encounter (HOSPITAL_COMMUNITY): Payer: Self-pay

## 2017-07-12 ENCOUNTER — Other Ambulatory Visit: Payer: Self-pay

## 2017-07-12 ENCOUNTER — Inpatient Hospital Stay (HOSPITAL_COMMUNITY): Payer: Medicare Other

## 2017-07-12 ENCOUNTER — Inpatient Hospital Stay (HOSPITAL_COMMUNITY): Payer: Medicare Other | Attending: Oncology

## 2017-07-12 VITALS — BP 110/55 | HR 64 | Temp 98.3°F | Resp 18

## 2017-07-12 DIAGNOSIS — D631 Anemia in chronic kidney disease: Secondary | ICD-10-CM | POA: Diagnosis present

## 2017-07-12 DIAGNOSIS — N189 Chronic kidney disease, unspecified: Secondary | ICD-10-CM

## 2017-07-12 DIAGNOSIS — N184 Chronic kidney disease, stage 4 (severe): Secondary | ICD-10-CM | POA: Insufficient documentation

## 2017-07-12 LAB — CBC
HEMATOCRIT: 30.5 % — AB (ref 36.0–46.0)
Hemoglobin: 9.5 g/dL — ABNORMAL LOW (ref 12.0–15.0)
MCH: 30.7 pg (ref 26.0–34.0)
MCHC: 31.1 g/dL (ref 30.0–36.0)
MCV: 98.7 fL (ref 78.0–100.0)
Platelets: 212 10*3/uL (ref 150–400)
RBC: 3.09 MIL/uL — ABNORMAL LOW (ref 3.87–5.11)
RDW: 13.8 % (ref 11.5–15.5)
WBC: 7.7 10*3/uL (ref 4.0–10.5)

## 2017-07-12 MED ORDER — DARBEPOETIN ALFA 150 MCG/0.3ML IJ SOSY
150.0000 ug | PREFILLED_SYRINGE | Freq: Once | INTRAMUSCULAR | Status: AC
  Administered 2017-07-12: 150 ug via SUBCUTANEOUS

## 2017-07-12 MED ORDER — DARBEPOETIN ALFA 150 MCG/0.3ML IJ SOSY
PREFILLED_SYRINGE | INTRAMUSCULAR | Status: AC
  Filled 2017-07-12: qty 0.3

## 2017-07-12 NOTE — Patient Instructions (Signed)
[facility] at [facility] Discharge Instructions  Aranesp given today.  Follow up as scheduled.   Thank you for choosing [facility] at [facility] to provide your oncology and hematology care.  To afford each patient quality time with our provider, please arrive at least 15 minutes before your scheduled appointment time.   If you have a lab appointment with the Cancer Center please come in thru the  Main Entrance and check in at the main information desk  You need to re-schedule your appointment should you arrive 10 or more minutes late.  We strive to give you quality time with our providers, and arriving late affects you and other patients whose appointments are after yours.  Also, if you no show three or more times for appointments you may be dismissed from the clinic at the providers discretion.     Again, thank you for choosing [facility].  Our hope is that these requests will decrease the amount of time that you wait before being seen by our physicians.       _____________________________________________________________  Should you have questions after your visit to [facility], please contact our office at [phone] between the hours of 8:30 a.m. and 4:30 p.m.  Voicemails left after 4:30 p.m. will not be returned until the following business day.  For prescription refill requests, have your pharmacy contact our office.       Resources For Cancer Patients and their Caregivers ? American Cancer Society: Can assist with transportation, wigs, general needs, runs Look Good Feel Better.        1-888-227-6333 ? Cancer Care: Provides financial assistance, online support groups, medication/co-pay assistance.  1-800-813-HOPE (4673) ? [facility] Assists [place] cancer patients and their families through emotional , educational and financial support.  336-427-4357 ? [place]  DSS Where to apply for food stamps, Medicaid and utility assistance. 336-342-1394 ? [facility]: Transportation to medical appointments. 336-347-2287 ? Social Security Administration: May apply for disability if have a Stage IV cancer. 336-342-7796 1-800-772-1213 ? [place] Aging, Disability and Transit Services: Assists with nutrition, care and transit needs. 336-349-2343  Cancer Center Support Programs:   > Cancer Support Group  2nd Tuesday of the month 1pm-2pm, Journey Room   > Creative Journey  3rd Tuesday of the month 1130am-1pm, Journey Room    

## 2017-07-12 NOTE — Progress Notes (Signed)
Shannon [name] presents today for injection per MD orders. Aranesp 150 mcg administered SQ in right Abdomen. Administration without incident. Patient tolerated well.  Treatment given per orders. Patient tolerated it well without problems. Vitals stable and discharged home from clinic ambulatory. Follow up as scheduled.

## 2017-07-12 NOTE — Progress Notes (Signed)
Per Dr. Corliss Skains note on 06/21/17, pt is to receive Aranesp every 21 days instead of q28 days.

## 2017-08-02 ENCOUNTER — Other Ambulatory Visit: Payer: Self-pay

## 2017-08-02 ENCOUNTER — Inpatient Hospital Stay (HOSPITAL_COMMUNITY): Payer: Medicare Other

## 2017-08-02 ENCOUNTER — Encounter (HOSPITAL_COMMUNITY): Payer: Self-pay

## 2017-08-02 VITALS — BP 135/59 | HR 62 | Temp 98.3°F | Resp 18

## 2017-08-02 DIAGNOSIS — N189 Chronic kidney disease, unspecified: Secondary | ICD-10-CM

## 2017-08-02 DIAGNOSIS — D631 Anemia in chronic kidney disease: Secondary | ICD-10-CM

## 2017-08-02 DIAGNOSIS — N184 Chronic kidney disease, stage 4 (severe): Secondary | ICD-10-CM

## 2017-08-02 LAB — CBC
HEMATOCRIT: 31 % — AB (ref 36.0–46.0)
Hemoglobin: 9.6 g/dL — ABNORMAL LOW (ref 12.0–15.0)
MCH: 30.6 pg (ref 26.0–34.0)
MCHC: 31 g/dL (ref 30.0–36.0)
MCV: 98.7 fL (ref 78.0–100.0)
PLATELETS: 220 10*3/uL (ref 150–400)
RBC: 3.14 MIL/uL — ABNORMAL LOW (ref 3.87–5.11)
RDW: 14.2 % (ref 11.5–15.5)
WBC: 8.2 10*3/uL (ref 4.0–10.5)

## 2017-08-02 MED ORDER — DARBEPOETIN ALFA 150 MCG/0.3ML IJ SOSY
150.0000 ug | PREFILLED_SYRINGE | Freq: Once | INTRAMUSCULAR | Status: AC
  Administered 2017-08-02: 150 ug via SUBCUTANEOUS

## 2017-08-02 MED ORDER — DARBEPOETIN ALFA 150 MCG/0.3ML IJ SOSY
PREFILLED_SYRINGE | INTRAMUSCULAR | Status: AC
  Filled 2017-08-02: qty 0.3

## 2017-08-02 NOTE — Progress Notes (Signed)
Judi Cong presents today for injection per MD orders. Aranesp 150 mcg administered SQ in right Abdomen. Administration without incident. Patient tolerated well.   Vitals stable and discharged home from clinic ambulatory. Follow up as scheduled.

## 2017-08-02 NOTE — Patient Instructions (Signed)
[facility] at [facility] Discharge Instructions  Aranesp given Follow up as scheduled.   Thank you for choosing [facility] at [facility] to provide your oncology and hematology care.  To afford each patient quality time with our provider, please arrive at least 15 minutes before your scheduled appointment time.   If you have a lab appointment with the Cancer Center please come in thru the  Main Entrance and check in at the main information desk  You need to re-schedule your appointment should you arrive 10 or more minutes late.  We strive to give you quality time with our providers, and arriving late affects you and other patients whose appointments are after yours.  Also, if you no show three or more times for appointments you may be dismissed from the clinic at the providers discretion.     Again, thank you for choosing [facility].  Our hope is that these requests will decrease the amount of time that you wait before being seen by our physicians.       _____________________________________________________________  Should you have questions after your visit to [facility], please contact our office at [phone] between the hours of 8:30 a.m. and 4:30 p.m.  Voicemails left after 4:30 p.m. will not be returned until the following business day.  For prescription refill requests, have your pharmacy contact our office.       Resources For Cancer Patients and their Caregivers ? American Cancer Society: Can assist with transportation, wigs, general needs, runs Look Good Feel Better.        1-888-227-6333 ? Cancer Care: Provides financial assistance, online support groups, medication/co-pay assistance.  1-800-813-HOPE (4673) ? [facility] Assists [place] cancer patients and their families through emotional , educational and financial support.  336-427-4357 ? [place] DSS Where to  apply for food stamps, Medicaid and utility assistance. 336-342-1394 ? RCATS: Transportation to medical appointments. 336-347-2287 ? Social Security Administration: May apply for disability if have a Stage IV cancer. 336-342-7796 1-800-772-1213 ? [place] Aging, Disability and Transit Services: Assists with nutrition, care and transit needs. 336-349-2343  Cancer Center Support Programs:   > Cancer Support Group  2nd Tuesday of the month 1pm-2pm, Journey Room   > Creative Journey  3rd Tuesday of the month 1130am-1pm, Journey Room    

## 2017-08-23 ENCOUNTER — Inpatient Hospital Stay (HOSPITAL_COMMUNITY): Payer: Medicare Other

## 2017-08-23 ENCOUNTER — Other Ambulatory Visit (HOSPITAL_COMMUNITY)
Admission: RE | Admit: 2017-08-23 | Discharge: 2017-08-23 | Disposition: A | Discharge status: Home / Self Care | Payer: Medicare Other | Source: Ambulatory Visit | Attending: Nephrology | Admitting: Nephrology

## 2017-08-23 ENCOUNTER — Inpatient Hospital Stay (HOSPITAL_COMMUNITY): Payer: Medicare Other | Attending: Internal Medicine

## 2017-08-23 DIAGNOSIS — N184 Chronic kidney disease, stage 4 (severe): Secondary | ICD-10-CM | POA: Diagnosis present

## 2017-08-23 DIAGNOSIS — D631 Anemia in chronic kidney disease: Secondary | ICD-10-CM | POA: Diagnosis present

## 2017-08-23 DIAGNOSIS — E559 Vitamin D deficiency, unspecified: Secondary | ICD-10-CM | POA: Insufficient documentation

## 2017-08-23 DIAGNOSIS — N189 Chronic kidney disease, unspecified: Secondary | ICD-10-CM

## 2017-08-23 DIAGNOSIS — D509 Iron deficiency anemia, unspecified: Secondary | ICD-10-CM | POA: Diagnosis present

## 2017-08-23 DIAGNOSIS — R809 Proteinuria, unspecified: Secondary | ICD-10-CM | POA: Insufficient documentation

## 2017-08-23 DIAGNOSIS — I10 Essential (primary) hypertension: Secondary | ICD-10-CM | POA: Insufficient documentation

## 2017-08-23 DIAGNOSIS — Z79899 Other long term (current) drug therapy: Secondary | ICD-10-CM | POA: Insufficient documentation

## 2017-08-23 LAB — CBC
HCT: 32.7 % — ABNORMAL LOW (ref 36.0–46.0)
Hemoglobin: 10.1 g/dL — ABNORMAL LOW (ref 12.0–15.0)
MCH: 30 pg (ref 26.0–34.0)
MCHC: 30.9 g/dL (ref 30.0–36.0)
MCV: 97 fL (ref 78.0–100.0)
PLATELETS: 209 10*3/uL (ref 150–400)
RBC: 3.37 MIL/uL — ABNORMAL LOW (ref 3.87–5.11)
RDW: 14.4 % (ref 11.5–15.5)
WBC: 9.6 10*3/uL (ref 4.0–10.5)

## 2017-08-23 LAB — RENAL FUNCTION PANEL
ALBUMIN: 3.4 g/dL — AB (ref 3.5–5.0)
Anion gap: 12 (ref 5–15)
BUN: 49 mg/dL — AB (ref 6–20)
CO2: 22 mmol/L (ref 22–32)
CREATININE: 4.08 mg/dL — AB (ref 0.44–1.00)
Calcium: 9 mg/dL (ref 8.9–10.3)
Chloride: 107 mmol/L (ref 101–111)
GFR calc Af Amer: 13 mL/min — ABNORMAL LOW (ref >60)
GFR, EST NON AFRICAN AMERICAN: 11 mL/min — AB (ref >60)
Glucose, Bld: 106 mg/dL — ABNORMAL HIGH (ref 65–99)
PHOSPHORUS: 4.3 mg/dL (ref 2.5–4.6)
Potassium: 4.3 mmol/L (ref 3.5–5.1)
Sodium: 141 mmol/L (ref 135–145)

## 2017-08-23 LAB — PROTEIN / CREATININE RATIO, URINE
CREATININE, URINE: 81.6 mg/dL
Protein Creatinine Ratio: 0.65 mg/mg{Cre} — ABNORMAL HIGH (ref 0.00–0.15)
TOTAL PROTEIN, URINE: 53 mg/dL

## 2017-08-23 LAB — IRON AND TIBC
IRON: 43 ug/dL (ref 28–170)
Saturation Ratios: 20 % (ref 10.4–31.8)
TIBC: 218 ug/dL — ABNORMAL LOW (ref 250–450)
UIBC: 175 ug/dL

## 2017-08-23 LAB — FERRITIN: Ferritin: 287 ng/mL (ref 11–307)

## 2017-08-23 NOTE — Progress Notes (Signed)
Pt's hgb was 10.1 and per Dr. Florentina Addison orders patient does not get aranesp if greater than 10. Patient given a copy of her labs and notified. She was happy she didn't have to get the shot today. Patient has return appts scheduled already.

## 2017-08-24 LAB — VITAMIN D 25 HYDROXY (VIT D DEFICIENCY, FRACTURES): Vit D, 25-Hydroxy: 49.3 ng/mL (ref 30.0–100.0)

## 2017-08-24 LAB — PARATHYROID HORMONE, INTACT (NO CA): PTH: 65 pg/mL (ref 15–65)

## 2017-09-13 ENCOUNTER — Other Ambulatory Visit: Payer: Self-pay

## 2017-09-13 ENCOUNTER — Encounter (HOSPITAL_COMMUNITY): Payer: Self-pay

## 2017-09-13 ENCOUNTER — Inpatient Hospital Stay (HOSPITAL_COMMUNITY): Payer: Medicare Other

## 2017-09-13 ENCOUNTER — Inpatient Hospital Stay (HOSPITAL_COMMUNITY): Payer: Medicare Other | Attending: Hematology

## 2017-09-13 VITALS — BP 137/61 | HR 66 | Resp 16

## 2017-09-13 DIAGNOSIS — N189 Chronic kidney disease, unspecified: Secondary | ICD-10-CM

## 2017-09-13 DIAGNOSIS — D631 Anemia in chronic kidney disease: Secondary | ICD-10-CM

## 2017-09-13 DIAGNOSIS — N184 Chronic kidney disease, stage 4 (severe): Secondary | ICD-10-CM | POA: Diagnosis present

## 2017-09-13 LAB — CBC
HCT: 30.1 % — ABNORMAL LOW (ref 36.0–46.0)
HEMOGLOBIN: 9.6 g/dL — AB (ref 12.0–15.0)
MCH: 30.9 pg (ref 26.0–34.0)
MCHC: 31.9 g/dL (ref 30.0–36.0)
MCV: 96.8 fL (ref 78.0–100.0)
Platelets: 183 10*3/uL (ref 150–400)
RBC: 3.11 MIL/uL — ABNORMAL LOW (ref 3.87–5.11)
RDW: 14.6 % (ref 11.5–15.5)
WBC: 8.5 10*3/uL (ref 4.0–10.5)

## 2017-09-13 MED ORDER — DARBEPOETIN ALFA 150 MCG/0.3ML IJ SOSY
150.0000 ug | PREFILLED_SYRINGE | Freq: Once | INTRAMUSCULAR | Status: AC
  Administered 2017-09-13: 150 ug via SUBCUTANEOUS
  Filled 2017-09-13: qty 0.3

## 2017-09-13 NOTE — Progress Notes (Signed)
Pt here today for Aranesp injection. Hgb today was 9.6. Pt given injection in right lower abdomen. Pt tolerated well. Pt stable and discharged home ambulatory. Pt to return in 28 days for next Aranesp injection.

## 2017-10-04 ENCOUNTER — Inpatient Hospital Stay (HOSPITAL_COMMUNITY): Payer: Medicare Other

## 2017-10-04 ENCOUNTER — Encounter (HOSPITAL_COMMUNITY): Payer: Self-pay

## 2017-10-04 VITALS — BP 111/55 | HR 68 | Resp 18

## 2017-10-04 DIAGNOSIS — D631 Anemia in chronic kidney disease: Secondary | ICD-10-CM

## 2017-10-04 DIAGNOSIS — N189 Chronic kidney disease, unspecified: Secondary | ICD-10-CM

## 2017-10-04 DIAGNOSIS — N184 Chronic kidney disease, stage 4 (severe): Secondary | ICD-10-CM | POA: Diagnosis not present

## 2017-10-04 LAB — CBC
HEMATOCRIT: 31 % — AB (ref 36.0–46.0)
HEMOGLOBIN: 9.8 g/dL — AB (ref 12.0–15.0)
MCH: 30.6 pg (ref 26.0–34.0)
MCHC: 31.6 g/dL (ref 30.0–36.0)
MCV: 96.9 fL (ref 78.0–100.0)
Platelets: 199 10*3/uL (ref 150–400)
RBC: 3.2 MIL/uL — AB (ref 3.87–5.11)
RDW: 15 % (ref 11.5–15.5)
WBC: 7.1 10*3/uL (ref 4.0–10.5)

## 2017-10-04 MED ORDER — DARBEPOETIN ALFA 150 MCG/0.3ML IJ SOSY
150.0000 ug | PREFILLED_SYRINGE | Freq: Once | INTRAMUSCULAR | Status: AC
  Administered 2017-10-04: 150 ug via SUBCUTANEOUS
  Filled 2017-10-04: qty 0.3

## 2017-10-04 NOTE — Patient Instructions (Signed)
[facility] at [facility]  Discharge Instructions:  You received an aranesp shot today.  _______________________________________________________________  Thank you for choosing [facility] at [facility] to provide your oncology and hematology care.  To afford each patient quality time with our providers, please arrive at least 15 minutes before your scheduled appointment.  You need to re-schedule your appointment if you arrive 10 or more minutes late.  We strive to give you quality time with our providers, and arriving late affects you and other patients whose appointments are after yours.  Also, if you no show three or more times for appointments you may be dismissed from the clinic.  Again, thank you for choosing [facility] at [facility]. Our hope is that these requests will allow you access to exceptional care and in a timely manner. _______________________________________________________________  If you have questions after your visit, please contact our office at [phone] between the hours of 8:30 a.m. and 5:00 p.m. Voicemails left after 4:30 p.m. will not be returned until the following business day. _______________________________________________________________  For prescription refill requests, have your pharmacy contact our office. _______________________________________________________________  Recommendations made by the consultant and any test results will be sent to your referring physician. _______________________________________________________________ 

## 2017-10-04 NOTE — Progress Notes (Signed)
Patient tolerated aranesp shot with no complaints voiced.  Site clean and dry with no bruising or swelling noted.  Band aid applied.  VSS with discharge and left ambulatory with no s/s of distress noted.

## 2017-10-25 ENCOUNTER — Encounter (HOSPITAL_COMMUNITY): Payer: Self-pay

## 2017-10-25 ENCOUNTER — Inpatient Hospital Stay (HOSPITAL_COMMUNITY): Payer: Medicare Other

## 2017-10-25 ENCOUNTER — Inpatient Hospital Stay (HOSPITAL_COMMUNITY): Payer: Medicare Other | Attending: Hematology

## 2017-10-25 VITALS — BP 131/52 | HR 61 | Temp 98.5°F | Resp 18

## 2017-10-25 DIAGNOSIS — D631 Anemia in chronic kidney disease: Secondary | ICD-10-CM

## 2017-10-25 DIAGNOSIS — N184 Chronic kidney disease, stage 4 (severe): Secondary | ICD-10-CM | POA: Insufficient documentation

## 2017-10-25 DIAGNOSIS — N189 Chronic kidney disease, unspecified: Secondary | ICD-10-CM

## 2017-10-25 LAB — CBC
HEMATOCRIT: 31.8 % — AB (ref 36.0–46.0)
HEMOGLOBIN: 9.9 g/dL — AB (ref 12.0–15.0)
MCH: 30.5 pg (ref 26.0–34.0)
MCHC: 31.1 g/dL (ref 30.0–36.0)
MCV: 97.8 fL (ref 78.0–100.0)
Platelets: 198 10*3/uL (ref 150–400)
RBC: 3.25 MIL/uL — AB (ref 3.87–5.11)
RDW: 15 % (ref 11.5–15.5)
WBC: 8.3 10*3/uL (ref 4.0–10.5)

## 2017-10-25 MED ORDER — DARBEPOETIN ALFA 150 MCG/0.3ML IJ SOSY
150.0000 ug | PREFILLED_SYRINGE | Freq: Once | INTRAMUSCULAR | Status: AC
  Administered 2017-10-25: 150 ug via SUBCUTANEOUS
  Filled 2017-10-25: qty 0.3

## 2017-10-25 NOTE — Progress Notes (Signed)
Shannon [name] tolerated Aranesp injection well without complaints or incident. VSS Hgb 9.9 today. Pt discharged self ambulatory in satisfactory condition

## 2017-10-25 NOTE — Patient Instructions (Signed)
[facility] at [facility] Discharge Instructions  Received Aranesp injection today. Follow-up as scheduled. Call clinic for any questions or concerns   Thank you for choosing  [facility] at [facility] to provide your oncology and hematology care.  To afford each patient quality time with our provider, please arrive at least 15 minutes before your scheduled appointment time.   If you have a lab appointment with the Cancer Center please come in thru the  Main Entrance and check in at the main information desk  You need to re-schedule your appointment should you arrive 10 or more minutes late.  We strive to give you quality time with our providers, and arriving late affects you and other patients whose appointments are after yours.  Also, if you no show three or more times for appointments you may be dismissed from the clinic at the providers discretion.     Again, thank you for choosing [facility].  Our hope is that these requests will decrease the amount of time that you wait before being seen by our physicians.       _____________________________________________________________  Should you have questions after your visit to [facility], please contact our office at [phone] between the hours of 8:30 a.m. and 4:30 p.m.  Voicemails left after 4:30 p.m. will not be returned until the following business day.  For prescription refill requests, have your pharmacy contact our office.       Resources For Cancer Patients and their Caregivers ? American Cancer Society: Can assist with transportation, wigs, general needs, runs Look Good Feel Better.        1-888-227-6333 ? Cancer Care: Provides financial assistance, online support groups, medication/co-pay assistance.  1-800-813-HOPE (4673) ? [facility] Assists [place] cancer patients and their families through emotional , educational and  financial support.  336-427-4357 ? [place] DSS Where to apply for food stamps, Medicaid and utility assistance. 336-342-1394 ? RCATS: Transportation to medical appointments. 336-347-2287 ? Social Security Administration: May apply for disability if have a Stage IV cancer. 336-342-7796 1-800-772-1213 ? [place] Aging, Disability and Transit Services: Assists with nutrition, care and transit needs. 336-349-2343  Cancer Center Support Programs:   > Cancer Support Group  2nd Tuesday of the month 1pm-2pm, Journey Room   > Creative Journey  3rd Tuesday of the month 1130am-1pm, Journey Room    

## 2017-11-12 ENCOUNTER — Other Ambulatory Visit (HOSPITAL_COMMUNITY): Payer: Self-pay | Admitting: *Deleted

## 2017-11-12 DIAGNOSIS — D631 Anemia in chronic kidney disease: Secondary | ICD-10-CM

## 2017-11-12 DIAGNOSIS — N189 Chronic kidney disease, unspecified: Secondary | ICD-10-CM

## 2017-11-15 ENCOUNTER — Other Ambulatory Visit (HOSPITAL_COMMUNITY): Payer: Medicare Other

## 2017-11-15 ENCOUNTER — Ambulatory Visit (HOSPITAL_COMMUNITY): Payer: Medicare Other

## 2017-11-18 ENCOUNTER — Other Ambulatory Visit: Payer: Self-pay

## 2017-11-18 ENCOUNTER — Inpatient Hospital Stay (HOSPITAL_COMMUNITY): Payer: Medicare Other

## 2017-11-18 ENCOUNTER — Encounter (HOSPITAL_COMMUNITY): Payer: Self-pay

## 2017-11-18 ENCOUNTER — Inpatient Hospital Stay (HOSPITAL_COMMUNITY): Payer: Medicare Other | Attending: Hematology

## 2017-11-18 DIAGNOSIS — D631 Anemia in chronic kidney disease: Secondary | ICD-10-CM

## 2017-11-18 DIAGNOSIS — N189 Chronic kidney disease, unspecified: Secondary | ICD-10-CM

## 2017-11-18 DIAGNOSIS — F17210 Nicotine dependence, cigarettes, uncomplicated: Secondary | ICD-10-CM | POA: Insufficient documentation

## 2017-11-18 DIAGNOSIS — I120 Hypertensive chronic kidney disease with stage 5 chronic kidney disease or end stage renal disease: Secondary | ICD-10-CM | POA: Insufficient documentation

## 2017-11-18 DIAGNOSIS — N186 End stage renal disease: Secondary | ICD-10-CM | POA: Diagnosis present

## 2017-11-18 LAB — CBC WITH DIFFERENTIAL/PLATELET
BASOS ABS: 0 10*3/uL (ref 0.0–0.1)
BASOS PCT: 0 %
Eosinophils Absolute: 0.2 10*3/uL (ref 0.0–0.7)
Eosinophils Relative: 3 %
HCT: 31.7 % — ABNORMAL LOW (ref 36.0–46.0)
Hemoglobin: 9.8 g/dL — ABNORMAL LOW (ref 12.0–15.0)
LYMPHS PCT: 23 %
Lymphs Abs: 1.9 10*3/uL (ref 0.7–4.0)
MCH: 30.3 pg (ref 26.0–34.0)
MCHC: 30.9 g/dL (ref 30.0–36.0)
MCV: 98.1 fL (ref 78.0–100.0)
MONO ABS: 0.3 10*3/uL (ref 0.1–1.0)
Monocytes Relative: 4 %
NEUTROS ABS: 5.6 10*3/uL (ref 1.7–7.7)
Neutrophils Relative %: 70 %
Platelets: 203 10*3/uL (ref 150–400)
RBC: 3.23 MIL/uL — AB (ref 3.87–5.11)
RDW: 15.2 % (ref 11.5–15.5)
WBC: 8 10*3/uL (ref 4.0–10.5)

## 2017-11-18 MED ORDER — DARBEPOETIN ALFA 150 MCG/0.3ML IJ SOSY
PREFILLED_SYRINGE | INTRAMUSCULAR | Status: AC
  Filled 2017-11-18: qty 0.3

## 2017-11-18 MED ORDER — DARBEPOETIN ALFA 150 MCG/0.3ML IJ SOSY
150.0000 ug | PREFILLED_SYRINGE | Freq: Once | INTRAMUSCULAR | Status: AC
  Administered 2017-11-18: 150 ug via SUBCUTANEOUS

## 2017-11-18 NOTE — Progress Notes (Signed)
Pt here today for her Aranesp injection. Pt given injection in her right lower abdomen. Pt tolerated injection well. Pt stable and discharged home ambulatory. Pt to return to clinic as scheduled.

## 2017-12-06 ENCOUNTER — Ambulatory Visit (HOSPITAL_COMMUNITY): Payer: Medicare Other

## 2017-12-06 ENCOUNTER — Other Ambulatory Visit (HOSPITAL_COMMUNITY): Payer: Medicare Other

## 2017-12-06 ENCOUNTER — Ambulatory Visit (HOSPITAL_COMMUNITY): Payer: Medicare Other | Admitting: Hematology

## 2017-12-09 ENCOUNTER — Encounter (HOSPITAL_COMMUNITY): Payer: Self-pay | Admitting: Internal Medicine

## 2017-12-09 ENCOUNTER — Inpatient Hospital Stay (HOSPITAL_COMMUNITY): Payer: Medicare Other

## 2017-12-09 ENCOUNTER — Inpatient Hospital Stay (HOSPITAL_BASED_OUTPATIENT_CLINIC_OR_DEPARTMENT_OTHER): Payer: Medicare Other | Admitting: Internal Medicine

## 2017-12-09 VITALS — BP 162/66 | HR 64 | Temp 97.9°F | Resp 18 | Wt 230.6 lb

## 2017-12-09 DIAGNOSIS — I120 Hypertensive chronic kidney disease with stage 5 chronic kidney disease or end stage renal disease: Secondary | ICD-10-CM

## 2017-12-09 DIAGNOSIS — N186 End stage renal disease: Secondary | ICD-10-CM

## 2017-12-09 DIAGNOSIS — N189 Chronic kidney disease, unspecified: Secondary | ICD-10-CM

## 2017-12-09 DIAGNOSIS — D631 Anemia in chronic kidney disease: Secondary | ICD-10-CM | POA: Diagnosis not present

## 2017-12-09 DIAGNOSIS — F17210 Nicotine dependence, cigarettes, uncomplicated: Secondary | ICD-10-CM | POA: Diagnosis not present

## 2017-12-09 LAB — CBC WITH DIFFERENTIAL/PLATELET
BASOS ABS: 0 10*3/uL (ref 0.0–0.1)
BASOS PCT: 0 %
EOS ABS: 0.2 10*3/uL (ref 0.0–0.7)
EOS PCT: 3 %
HEMATOCRIT: 33.1 % — AB (ref 36.0–46.0)
Hemoglobin: 10.3 g/dL — ABNORMAL LOW (ref 12.0–15.0)
Lymphocytes Relative: 16 %
Lymphs Abs: 1.2 10*3/uL (ref 0.7–4.0)
MCH: 30.5 pg (ref 26.0–34.0)
MCHC: 31.1 g/dL (ref 30.0–36.0)
MCV: 97.9 fL (ref 78.0–100.0)
MONO ABS: 0.4 10*3/uL (ref 0.1–1.0)
Monocytes Relative: 5 %
NEUTROS ABS: 5.9 10*3/uL (ref 1.7–7.7)
Neutrophils Relative %: 76 %
PLATELETS: 184 10*3/uL (ref 150–400)
RBC: 3.38 MIL/uL — ABNORMAL LOW (ref 3.87–5.11)
RDW: 14.9 % (ref 11.5–15.5)
WBC: 7.7 10*3/uL (ref 4.0–10.5)

## 2017-12-09 NOTE — Progress Notes (Signed)
Diagnosis Anemia of renal disease - Plan: CBC with Differential/Platelet, CBC with Differential/Platelet, Comprehensive metabolic panel, Lactate dehydrogenase  Staging Cancer Staging No matching staging information was found for the patient.  Assessment and Plan:  1.  Anemia secondary to stage IV CKD.  Pt is on Aranesp every 3-4 weeks.  Treatment will be held if Hb greater than or equal to 10 as nephrologist does not want her hemoglobin higher than 10 due to issues with hypertension.  Last Aranesp was 11/18/2017.  Labs done 12/09/2017 reviewed with pt and showed WBC 7.7, HB 10.3 plts 184,000.  Aranesp held today.  Pt will have monthly CBC. She will be seen for follow-up in 6 months with labs.    2.  AVMs/esophageal varices.  Pt should contiue to follow-up with GI as recommended.    3.  CKD.  Continue to follow-up with nephrology as directed.    4.  HTN.  BP is 162/66.  Follow-up with PCP as directed.    5.  Smoking.  Cessation recommended.  Option for lung cancer screening clinic on RTC.    6.  Health maintenance.  Continue GI and mammogram screening as recommended.    Interval History:  62 yr old Shannon [name] previously followed by Dr. Talbert Cage for anemia of chronic renal disease.  Pt has history of CHF, MI, HTN, asthma, obesity, AVMs and esophageal varices.     Current Status:  Pt is sheen today for follow-up.  She is here to go over labs.     Problem List Patient Active Problem List   Diagnosis Date Noted  . Anemia associated with chronic renal failure [N18.9, D63.1] 11/10/2015  . Anemia of renal disease [N18.9, D63.1] 11/10/2015  . Cardiomyopathy (Shannon Shannon [name]) [I42.9] 04/21/2013  . HTN (hypertension) [I10] 04/21/2013  . Hyperlipidemia [E78.5] 04/21/2013  . Tobacco abuse [Z72.0] 04/21/2013  . End stage renal disease ([place]) [N18.6] 08/06/2012  . Chronic kidney disease, stage IV (severe) ([place]) [N18.4] 07/09/2012    Past Medical History Past Medical History:  Diagnosis Date  . Absolute anemia  11/10/2015  . Anemia   . Anemia of renal disease 11/10/2015  . Asthma    has Albuterol inhaler prn  . CHF (congestive heart failure) (Shannon Shannon [name])    acute systolic CHF 09/930 [facility])  . Chronic kidney disease    Membranous nephropathy ( Seen at [facility])  . Gastric ulcer   . History of blood transfusion    no abnormal reaction noted  . History of bronchitis    last time 3-27yrs ago  . Hyperlipidemia    takes Pravastatin daily  . Hypertension    takes Imdur and Hydralazine daily as well as Coreg  . Hypothyroidism    takes Synthroid daily  . Itching    on legs-has a Kenalog cream  . Metabolic bone disease   . Nausea    takes Zofran prn  . Nonischemic cardiomyopathy (Shannon Shannon [name])    12/2011 normal coronaries, EF 22% (EF normal 04/2013)  . Obesity   . Peripheral edema    takes Torsemide daily  . Thyroid disease    hypothyroidism    Past Surgical History Past Surgical History:  Procedure Laterality Date  . AV FISTULA PLACEMENT Left 07/22/2012   Procedure: INSERTION OF ARTERIOVENOUS GORE-TEX GRAFT ARM;  Surgeon: Angelia Mould, MD;  Location: [place];  Service: Vascular;  Laterality: Left;  . AV FISTULA PLACEMENT Right 05/24/2017   Procedure: INSERTION OF ARTERIOVENOUS (AV) GORE-TEX GRAFT RIGHT ARM;  Surgeon: Angelia Mould, MD;  Location: [facility] OR;  Service: Vascular;  Laterality: Right;  . CESAREAN SECTION     30+yrs ago  . COLONOSCOPY    . left arm surgery with pin     15+yrs ago  . TUBAL LIGATION      Family History Family History  Problem Relation Age of Onset  . Kidney disease Other   . Diabetes Mother      Social History  reports that she has been smoking cigarettes.  She started smoking about 49 years ago. She has a 40.00 pack-year smoking history. She has never used smokeless tobacco. She reports that she does not drink alcohol or use drugs.  Medications  Current Outpatient Medications:  .  acetaminophen (TYLENOL) 500 MG tablet, Take 1,000 mg by mouth daily as  needed for moderate pain or headache., Disp: , Rfl:  .  albuterol (PROVENTIL HFA;VENTOLIN HFA) 108 (90 BASE) MCG/ACT inhaler, Inhale 1-2 puffs into the lungs every 6 (six) hours as needed for wheezing. , Disp: , Rfl:  .  carvedilol (COREG) 25 MG tablet, Take 25 mg by mouth 2 (two) times daily. , Disp: , Rfl:  .  clobetasol cream (TEMOVATE) 9.41 %, Apply 1 application topically daily as needed (rash). , Disp: , Rfl:  .  hydrALAZINE (APRESOLINE) 50 MG tablet, Take 50 mg by mouth every 8 (eight) hours., Disp: , Rfl:  .  isosorbide mononitrate (IMDUR) 30 MG 24 hr tablet, Take 30 mg by mouth daily., Disp: , Rfl:  .  metolazone (ZAROXOLYN) 5 MG tablet, Take 5 mg by mouth 2 (two) times daily as needed (swelling). , Disp: , Rfl:  .  pravastatin (PRAVACHOL) 80 MG tablet, Take 80 mg by mouth daily., Disp: , Rfl:  .  raloxifene (EVISTA) 60 MG tablet, Take 60 mg by mouth daily., Disp: , Rfl:  .  RAYALDEE 30 MCG CPCR, Take 30 mcg by mouth at bedtime. , Disp: , Rfl:  .  sodium bicarbonate 650 MG tablet, Take 1,300 mg by mouth 3 (three) times daily. , Disp: , Rfl:  .  torsemide (DEMADEX) 20 MG tablet, Take 20 mg by mouth daily as needed (swelling). , Disp: , Rfl:   Allergies Corticosteroids and Rituximab  Review of Systems Review of Systems - Oncology ROS as per HPI otherwise 12 point ROS is negative.   Physical Exam  Vitals Wt Readings from Last 3 Encounters:  12/09/17 230 lb 9.6 oz (104.6 kg)  06/21/17 237 lb 6.4 oz (107.7 kg)  06/19/17 235 lb (106.6 kg)   Temp Readings from Last 3 Encounters:  12/09/17 97.9 F (36.6 C) (Oral)  10/25/17 98.5 F (36.9 C) (Oral)  08/02/17 98.3 F (36.8 C) (Oral)   BP Readings from Last 3 Encounters:  12/09/17 (!) 162/66  10/25/17 (!) 131/52  10/04/17 (!) 111/55   Pulse Readings from Last 3 Encounters:  12/09/17 64  10/25/17 61  10/04/17 68   Constitutional: Well-developed, well-nourished, and in no distress.   HENT: Head: Normocephalic and  atraumatic.  Mouth/Throat: No oropharyngeal exudate. Mucosa moist. Eyes: Pupils are equal, round, and reactive to light. Conjunctivae are normal. No scleral icterus.  Neck: Normal range of motion. Neck supple. No JVD present.  Cardiovascular: Normal rate, regular rhythm and normal heart sounds.  Exam reveals no gallop and no friction rub.   No murmur heard. Pulmonary/Chest: Effort normal and breath sounds normal. No respiratory distress. No wheezes.No rales.  Abdominal: Soft. Bowel sounds are normal. No distension. There is no tenderness. There is  no guarding.  Musculoskeletal: No edema or tenderness.  Lymphadenopathy:  No cervical, axillary or supraclavicular adenopathy.  Neurological: Alert and oriented to person, place, and time. No cranial nerve deficit.  Skin: Skin is warm and dry. No rash noted. No erythema. No pallor.  Psychiatric: Affect and judgment normal.   Labs Appointment on 12/09/2017  Component Date Value Ref Range Status  . WBC 12/09/2017 7.7  4.0 - 10.5 K/uL Final  . RBC 12/09/2017 3.38* 3.87 - 5.11 MIL/uL Final  . Hemoglobin 12/09/2017 10.3* 12.0 - 15.0 g/dL Final  . HCT 12/09/2017 33.1* 36.0 - 46.0 % Final  . MCV 12/09/2017 97.9  78.0 - 100.0 fL Final  . MCH 12/09/2017 30.5  26.0 - 34.0 pg Final  . MCHC 12/09/2017 31.1  30.0 - 36.0 g/dL Final  . RDW 12/09/2017 14.9  11.5 - 15.5 % Final  . Platelets 12/09/2017 184  150 - 400 K/uL Final  . Neutrophils Relative % 12/09/2017 76  % Final  . Neutro Abs 12/09/2017 5.9  1.7 - 7.7 K/uL Final  . Lymphocytes Relative 12/09/2017 16  % Final  . Lymphs Abs 12/09/2017 1.2  0.7 - 4.0 K/uL Final  . Monocytes Relative 12/09/2017 5  % Final  . Monocytes Absolute 12/09/2017 0.4  0.1 - 1.0 K/uL Final  . Eosinophils Relative 12/09/2017 3  % Final  . Eosinophils Absolute 12/09/2017 0.2  0.0 - 0.7 K/uL Final  . Basophils Relative 12/09/2017 0  % Final  . Basophils Absolute 12/09/2017 0.0  0.0 - 0.1 K/uL Final   Performed at [facility], [address] [place], [place] [address]     Pathology Orders Placed This Encounter  Procedures  . CBC with Differential/Platelet    Standing Status:   Standing    Number of Occurrences:   12    Standing Expiration Date:   12/10/2018  . CBC with Differential/Platelet    Standing Status:   Future    Standing Expiration Date:   12/10/2019  . Comprehensive metabolic panel    Standing Status:   Future    Standing Expiration Date:   12/10/2019  . Lactate dehydrogenase    Standing Status:   Future    Standing Expiration Date:   12/10/2019       Zoila Shutter MD

## 2017-12-09 NOTE — Progress Notes (Signed)
Patient's hgb was 10.3 and she did not meet parameters to receive Aranesp today per Dr. Florentina Addison orders.

## 2018-01-06 ENCOUNTER — Inpatient Hospital Stay (HOSPITAL_COMMUNITY): Payer: Medicare Other | Attending: Hematology

## 2018-01-06 ENCOUNTER — Inpatient Hospital Stay (HOSPITAL_COMMUNITY): Payer: Medicare Other

## 2018-01-06 DIAGNOSIS — D631 Anemia in chronic kidney disease: Secondary | ICD-10-CM | POA: Diagnosis present

## 2018-01-06 DIAGNOSIS — N189 Chronic kidney disease, unspecified: Secondary | ICD-10-CM | POA: Insufficient documentation

## 2018-01-06 LAB — CBC WITH DIFFERENTIAL/PLATELET
BASOS ABS: 0 10*3/uL (ref 0.0–0.1)
BASOS PCT: 0 %
Eosinophils Absolute: 0.2 10*3/uL (ref 0.0–0.7)
Eosinophils Relative: 3 %
HEMATOCRIT: 32.4 % — AB (ref 36.0–46.0)
HEMOGLOBIN: 10.2 g/dL — AB (ref 12.0–15.0)
Lymphocytes Relative: 18 %
Lymphs Abs: 1.3 10*3/uL (ref 0.7–4.0)
MCH: 30.6 pg (ref 26.0–34.0)
MCHC: 31.5 g/dL (ref 30.0–36.0)
MCV: 97.3 fL (ref 78.0–100.0)
Monocytes Absolute: 0.2 10*3/uL (ref 0.1–1.0)
Monocytes Relative: 3 %
NEUTROS ABS: 5.6 10*3/uL (ref 1.7–7.7)
NEUTROS PCT: 76 %
Platelets: 184 10*3/uL (ref 150–400)
RBC: 3.33 MIL/uL — ABNORMAL LOW (ref 3.87–5.11)
RDW: 14.5 % (ref 11.5–15.5)
WBC: 7.3 10*3/uL (ref 4.0–10.5)

## 2018-01-06 NOTE — Progress Notes (Signed)
Hemoglobin 10.2. Will hold aranesp per protocol. Discharged home from clinic ambulatory. Follow up as scheduled.

## 2018-01-31 ENCOUNTER — Other Ambulatory Visit (HOSPITAL_COMMUNITY): Payer: Self-pay | Admitting: Internal Medicine

## 2018-02-03 ENCOUNTER — Encounter (HOSPITAL_COMMUNITY): Payer: Self-pay

## 2018-02-03 ENCOUNTER — Other Ambulatory Visit: Payer: Self-pay

## 2018-02-03 ENCOUNTER — Inpatient Hospital Stay (HOSPITAL_COMMUNITY): Payer: Medicare Other | Attending: Hematology

## 2018-02-03 ENCOUNTER — Inpatient Hospital Stay (HOSPITAL_COMMUNITY): Payer: Medicare Other

## 2018-02-03 VITALS — BP 164/66 | HR 61 | Temp 98.0°F | Resp 18

## 2018-02-03 DIAGNOSIS — N184 Chronic kidney disease, stage 4 (severe): Secondary | ICD-10-CM | POA: Diagnosis present

## 2018-02-03 DIAGNOSIS — N189 Chronic kidney disease, unspecified: Secondary | ICD-10-CM

## 2018-02-03 DIAGNOSIS — D631 Anemia in chronic kidney disease: Secondary | ICD-10-CM | POA: Diagnosis present

## 2018-02-03 LAB — CBC WITH DIFFERENTIAL/PLATELET
BASOS ABS: 0 10*3/uL (ref 0.0–0.1)
BASOS PCT: 0 %
EOS ABS: 0.3 10*3/uL (ref 0.0–0.7)
Eosinophils Relative: 4 %
HEMATOCRIT: 30.5 % — AB (ref 36.0–46.0)
Hemoglobin: 9.6 g/dL — ABNORMAL LOW (ref 12.0–15.0)
LYMPHS PCT: 23 %
Lymphs Abs: 1.8 10*3/uL (ref 0.7–4.0)
MCH: 30.6 pg (ref 26.0–34.0)
MCHC: 31.5 g/dL (ref 30.0–36.0)
MCV: 97.1 fL (ref 78.0–100.0)
MONOS PCT: 4 %
Monocytes Absolute: 0.3 10*3/uL (ref 0.1–1.0)
Neutro Abs: 5.5 10*3/uL (ref 1.7–7.7)
Neutrophils Relative %: 70 %
PLATELETS: 186 10*3/uL (ref 150–400)
RBC: 3.14 MIL/uL — ABNORMAL LOW (ref 3.87–5.11)
RDW: 14.7 % (ref 11.5–15.5)
WBC: 7.9 10*3/uL (ref 4.0–10.5)

## 2018-02-03 MED ORDER — DARBEPOETIN ALFA 150 MCG/0.3ML IJ SOSY
150.0000 ug | PREFILLED_SYRINGE | Freq: Once | INTRAMUSCULAR | Status: AC
  Administered 2018-02-03: 150 ug via SUBCUTANEOUS

## 2018-02-03 MED ORDER — DARBEPOETIN ALFA 150 MCG/0.3ML IJ SOSY
PREFILLED_SYRINGE | INTRAMUSCULAR | Status: AC
  Filled 2018-02-03: qty 0.3

## 2018-02-03 NOTE — Patient Instructions (Signed)
[place] at [facility] Discharge Instructions  Aranesp today.  Follow up as scheduled.   Thank you for choosing [place] at [facility] to provide your oncology and hematology care.  To afford each patient quality time with our provider, please arrive at least 15 minutes before your scheduled appointment time.   If you have a lab appointment with the [facility] please come in thru the  Main Entrance and check in at the main information desk  You need to re-schedule your appointment should you arrive 10 or more minutes late.  We strive to give you quality time with our providers, and arriving late affects you and other patients whose appointments are after yours.  Also, if you no show three or more times for appointments you may be dismissed from the clinic at the providers discretion.     Again, thank you for choosing [facility].  Our hope is that these requests will decrease the amount of time that you wait before being seen by our physicians.       _____________________________________________________________  Should you have questions after your visit to [facility], please contact our office at [phone] between the hours of 8:00 a.m. and 4:30 p.m.  Voicemails left after 4:00 p.m. will not be returned until the following business day.  For prescription refill requests, have your pharmacy contact our office and allow 72 hours.    Cancer Center Support Programs:   > Cancer Support Group  2nd Tuesday of the month 1pm-2pm, Journey Room

## 2018-02-03 NOTE — Progress Notes (Signed)
Aranesp given today per orders.   Patient tolerated it well without problems. Vitals stable and discharged home from clinic ambulatory. Follow up as scheduled.  

## 2018-03-03 ENCOUNTER — Inpatient Hospital Stay (HOSPITAL_COMMUNITY): Payer: Medicare Other | Attending: Hematology

## 2018-03-03 ENCOUNTER — Inpatient Hospital Stay (HOSPITAL_COMMUNITY): Payer: Medicare Other

## 2018-03-03 ENCOUNTER — Other Ambulatory Visit (HOSPITAL_COMMUNITY): Payer: Medicare Other

## 2018-03-03 ENCOUNTER — Ambulatory Visit (HOSPITAL_COMMUNITY): Payer: Medicare Other

## 2018-03-03 ENCOUNTER — Encounter (HOSPITAL_COMMUNITY): Payer: Self-pay

## 2018-03-03 VITALS — BP 128/55 | HR 65 | Temp 98.0°F | Resp 18

## 2018-03-03 DIAGNOSIS — N189 Chronic kidney disease, unspecified: Secondary | ICD-10-CM

## 2018-03-03 DIAGNOSIS — N184 Chronic kidney disease, stage 4 (severe): Secondary | ICD-10-CM | POA: Diagnosis not present

## 2018-03-03 DIAGNOSIS — D631 Anemia in chronic kidney disease: Secondary | ICD-10-CM | POA: Insufficient documentation

## 2018-03-03 LAB — CBC WITH DIFFERENTIAL/PLATELET
Abs Immature Granulocytes: 0.03 10*3/uL (ref 0.00–0.07)
BASOS ABS: 0 10*3/uL (ref 0.0–0.1)
BASOS PCT: 0 %
EOS ABS: 0.3 10*3/uL (ref 0.0–0.5)
Eosinophils Relative: 4 %
HCT: 31.5 % — ABNORMAL LOW (ref 36.0–46.0)
Hemoglobin: 9.5 g/dL — ABNORMAL LOW (ref 12.0–15.0)
IMMATURE GRANULOCYTES: 0 %
Lymphocytes Relative: 18 %
Lymphs Abs: 1.3 10*3/uL (ref 0.7–4.0)
MCH: 30.4 pg (ref 26.0–34.0)
MCHC: 30.2 g/dL (ref 30.0–36.0)
MCV: 100.6 fL — ABNORMAL HIGH (ref 80.0–100.0)
MONOS PCT: 4 %
Monocytes Absolute: 0.3 10*3/uL (ref 0.1–1.0)
NEUTROS ABS: 5.2 10*3/uL (ref 1.7–7.7)
Neutrophils Relative %: 74 %
PLATELETS: 202 10*3/uL (ref 150–400)
RBC: 3.13 MIL/uL — AB (ref 3.87–5.11)
RDW: 14.1 % (ref 11.5–15.5)
WBC: 7.1 10*3/uL (ref 4.0–10.5)
nRBC: 0 % (ref 0.0–0.2)

## 2018-03-03 MED ORDER — DARBEPOETIN ALFA 150 MCG/0.3ML IJ SOSY
150.0000 ug | PREFILLED_SYRINGE | Freq: Once | INTRAMUSCULAR | Status: AC
  Administered 2018-03-03: 150 ug via SUBCUTANEOUS
  Filled 2018-03-03: qty 0.3

## 2018-03-03 NOTE — Patient Instructions (Signed)
[facility] at [facility] Discharge Instructions  Received Aranesp injection today. Follow-up as scheduled. Call clinic for any questions or concerns   Thank you for choosing [facility] at [facility] to provide your oncology and hematology care.  To afford each patient quality time with our provider, please arrive at least 15 minutes before your scheduled appointment time.   If you have a lab appointment with the Cancer Center please come in thru the  Main Entrance and check in at the main information desk  You need to re-schedule your appointment should you arrive 10 or more minutes late.  We strive to give you quality time with our providers, and arriving late affects you and other patients whose appointments are after yours.  Also, if you no show three or more times for appointments you may be dismissed from the clinic at the providers discretion.     Again, thank you for choosing [facility].  Our hope is that these requests will decrease the amount of time that you wait before being seen by our physicians.       _____________________________________________________________  Should you have questions after your visit to [facility], please contact our office at [phone] between the hours of 8:00 a.m. and 4:30 p.m.  Voicemails left after 4:00 p.m. will not be returned until the following business day.  For prescription refill requests, have your pharmacy contact our office and allow 72 hours.    Cancer Center Support Programs:   > Cancer Support Group  2nd Tuesday of the month 1pm-2pm, Journey Room   

## 2018-03-03 NOTE — Progress Notes (Signed)
Shannon [name] tolerated Aranesp injection well without complaints or incident. Hgb 9.5 today. VSS Pt discharged self ambulatory in satisfactory condition

## 2018-03-27 ENCOUNTER — Other Ambulatory Visit (HOSPITAL_COMMUNITY): Payer: Self-pay

## 2018-03-27 DIAGNOSIS — D631 Anemia in chronic kidney disease: Secondary | ICD-10-CM

## 2018-03-27 DIAGNOSIS — N189 Chronic kidney disease, unspecified: Secondary | ICD-10-CM

## 2018-03-27 DIAGNOSIS — N184 Chronic kidney disease, stage 4 (severe): Secondary | ICD-10-CM

## 2018-03-31 ENCOUNTER — Inpatient Hospital Stay (HOSPITAL_COMMUNITY): Payer: Medicare Other

## 2018-03-31 ENCOUNTER — Inpatient Hospital Stay (HOSPITAL_COMMUNITY): Payer: Medicare Other | Attending: Hematology

## 2018-03-31 DIAGNOSIS — N184 Chronic kidney disease, stage 4 (severe): Secondary | ICD-10-CM

## 2018-03-31 DIAGNOSIS — D631 Anemia in chronic kidney disease: Secondary | ICD-10-CM | POA: Insufficient documentation

## 2018-03-31 DIAGNOSIS — N189 Chronic kidney disease, unspecified: Secondary | ICD-10-CM | POA: Insufficient documentation

## 2018-03-31 LAB — CBC
HEMATOCRIT: 33.6 % — AB (ref 36.0–46.0)
Hemoglobin: 10.3 g/dL — ABNORMAL LOW (ref 12.0–15.0)
MCH: 31.2 pg (ref 26.0–34.0)
MCHC: 30.7 g/dL (ref 30.0–36.0)
MCV: 101.8 fL — AB (ref 80.0–100.0)
NRBC: 0 % (ref 0.0–0.2)
PLATELETS: 199 10*3/uL (ref 150–400)
RBC: 3.3 MIL/uL — ABNORMAL LOW (ref 3.87–5.11)
RDW: 13.6 % (ref 11.5–15.5)
WBC: 8.7 10*3/uL (ref 4.0–10.5)

## 2018-03-31 NOTE — Progress Notes (Signed)
Shannon [name] presents today for Aranesp injection. Labs reviewed, hgb 10.3. Aranesp injection held per parameters. Copy of lab work and schedule given to pt. Discharged self ambulatory in satisfactory condition.

## 2018-04-28 ENCOUNTER — Inpatient Hospital Stay (HOSPITAL_COMMUNITY): Payer: Medicare Other | Attending: Hematology

## 2018-04-28 ENCOUNTER — Inpatient Hospital Stay (HOSPITAL_COMMUNITY): Payer: Medicare Other

## 2018-04-28 DIAGNOSIS — N189 Chronic kidney disease, unspecified: Secondary | ICD-10-CM | POA: Diagnosis not present

## 2018-04-28 DIAGNOSIS — Z23 Encounter for immunization: Secondary | ICD-10-CM | POA: Insufficient documentation

## 2018-04-28 DIAGNOSIS — D631 Anemia in chronic kidney disease: Secondary | ICD-10-CM | POA: Insufficient documentation

## 2018-04-28 DIAGNOSIS — N184 Chronic kidney disease, stage 4 (severe): Secondary | ICD-10-CM

## 2018-04-28 LAB — CBC
HCT: 33.4 % — ABNORMAL LOW (ref 36.0–46.0)
Hemoglobin: 10.1 g/dL — ABNORMAL LOW (ref 12.0–15.0)
MCH: 30.4 pg (ref 26.0–34.0)
MCHC: 30.2 g/dL (ref 30.0–36.0)
MCV: 100.6 fL — AB (ref 80.0–100.0)
NRBC: 0 % (ref 0.0–0.2)
PLATELETS: 180 10*3/uL (ref 150–400)
RBC: 3.32 MIL/uL — ABNORMAL LOW (ref 3.87–5.11)
RDW: 13.7 % (ref 11.5–15.5)
WBC: 7.9 10*3/uL (ref 4.0–10.5)

## 2018-04-28 MED ORDER — INFLUENZA VAC SPLIT QUAD 0.5 ML IM SUSY
PREFILLED_SYRINGE | INTRAMUSCULAR | Status: AC
  Filled 2018-04-28: qty 0.5

## 2018-04-28 MED ORDER — INFLUENZA VAC SPLIT QUAD 0.5 ML IM SUSY
0.5000 mL | PREFILLED_SYRINGE | Freq: Once | INTRAMUSCULAR | Status: AC
  Administered 2018-04-28: 0.5 mL via INTRAMUSCULAR

## 2018-04-28 NOTE — Progress Notes (Signed)
Pt here today for Aransep. HBG today 10.1. Per MD notes hold today. Appt schedule printed out and given to patient.  Discharged from clinic ambulatory. F/U with [facility] as scheduled.

## 2018-04-28 NOTE — Addendum Note (Signed)
Addended by: Benjiman Core D on: 04/28/2018 10:33 AM   Modules accepted: Orders

## 2018-04-28 NOTE — Progress Notes (Signed)
Pt requested flu vaccine at discharge. Flu vaccine given. Pt tolerated well. L deltoid.

## 2018-05-26 ENCOUNTER — Other Ambulatory Visit: Payer: Self-pay

## 2018-05-26 ENCOUNTER — Inpatient Hospital Stay (HOSPITAL_BASED_OUTPATIENT_CLINIC_OR_DEPARTMENT_OTHER): Payer: Medicare Other | Admitting: Internal Medicine

## 2018-05-26 ENCOUNTER — Encounter (HOSPITAL_COMMUNITY): Payer: Self-pay | Admitting: Internal Medicine

## 2018-05-26 ENCOUNTER — Inpatient Hospital Stay (HOSPITAL_COMMUNITY): Payer: Medicare Other

## 2018-05-26 ENCOUNTER — Inpatient Hospital Stay (HOSPITAL_COMMUNITY): Payer: Medicare Other | Attending: Hematology

## 2018-05-26 VITALS — BP 119/63 | HR 60 | Temp 98.1°F | Resp 20 | Wt 230.2 lb

## 2018-05-26 DIAGNOSIS — N189 Chronic kidney disease, unspecified: Secondary | ICD-10-CM

## 2018-05-26 DIAGNOSIS — D631 Anemia in chronic kidney disease: Secondary | ICD-10-CM

## 2018-05-26 DIAGNOSIS — N186 End stage renal disease: Secondary | ICD-10-CM | POA: Diagnosis present

## 2018-05-26 DIAGNOSIS — I132 Hypertensive heart and chronic kidney disease with heart failure and with stage 5 chronic kidney disease, or end stage renal disease: Secondary | ICD-10-CM | POA: Insufficient documentation

## 2018-05-26 DIAGNOSIS — E039 Hypothyroidism, unspecified: Secondary | ICD-10-CM

## 2018-05-26 LAB — CBC WITH DIFFERENTIAL/PLATELET
Abs Immature Granulocytes: 0.04 10*3/uL (ref 0.00–0.07)
BASOS ABS: 0 10*3/uL (ref 0.0–0.1)
Basophils Relative: 0 %
EOS ABS: 0.2 10*3/uL (ref 0.0–0.5)
EOS PCT: 3 %
HEMATOCRIT: 31.7 % — AB (ref 36.0–46.0)
Hemoglobin: 9.7 g/dL — ABNORMAL LOW (ref 12.0–15.0)
Immature Granulocytes: 1 %
Lymphocytes Relative: 19 %
Lymphs Abs: 1.3 10*3/uL (ref 0.7–4.0)
MCH: 30.8 pg (ref 26.0–34.0)
MCHC: 30.6 g/dL (ref 30.0–36.0)
MCV: 100.6 fL — ABNORMAL HIGH (ref 80.0–100.0)
MONO ABS: 0.3 10*3/uL (ref 0.1–1.0)
Monocytes Relative: 4 %
NRBC: 0 % (ref 0.0–0.2)
Neutro Abs: 5.3 10*3/uL (ref 1.7–7.7)
Neutrophils Relative %: 73 %
Platelets: 171 10*3/uL (ref 150–400)
RBC: 3.15 MIL/uL — ABNORMAL LOW (ref 3.87–5.11)
RDW: 13.7 % (ref 11.5–15.5)
WBC: 7.3 10*3/uL (ref 4.0–10.5)

## 2018-05-26 LAB — COMPREHENSIVE METABOLIC PANEL
ALT: 12 U/L (ref 0–44)
AST: 15 U/L (ref 15–41)
Albumin: 3.4 g/dL — ABNORMAL LOW (ref 3.5–5.0)
Alkaline Phosphatase: 60 U/L (ref 38–126)
Anion gap: 7 (ref 5–15)
BUN: 37 mg/dL — ABNORMAL HIGH (ref 8–23)
CHLORIDE: 113 mmol/L — AB (ref 98–111)
CO2: 21 mmol/L — ABNORMAL LOW (ref 22–32)
CREATININE: 3.64 mg/dL — AB (ref 0.44–1.00)
Calcium: 8.9 mg/dL (ref 8.9–10.3)
GFR, EST AFRICAN AMERICAN: 15 mL/min — AB (ref >60)
GFR, EST NON AFRICAN AMERICAN: 13 mL/min — AB (ref >60)
Glucose, Bld: 101 mg/dL — ABNORMAL HIGH (ref 70–99)
Potassium: 4 mmol/L (ref 3.5–5.1)
Sodium: 141 mmol/L (ref 135–145)
Total Bilirubin: 0.7 mg/dL (ref 0.3–1.2)
Total Protein: 6.5 g/dL (ref 6.5–8.1)

## 2018-05-26 LAB — LACTATE DEHYDROGENASE: LDH: 135 U/L (ref 98–192)

## 2018-05-26 MED ORDER — DARBEPOETIN ALFA 150 MCG/0.3ML IJ SOSY
150.0000 ug | PREFILLED_SYRINGE | Freq: Once | INTRAMUSCULAR | Status: AC
  Administered 2018-05-26: 150 ug via SUBCUTANEOUS

## 2018-05-26 NOTE — Patient Instructions (Signed)
[facility] at [facility]  Discharge Instructions: You saw Dr. Higgs today                               _______________________________________________________________  Thank you for choosing [facility] at [facility] to provide your oncology and hematology care.  To afford each patient quality time with our providers, please arrive at least 15 minutes before your scheduled appointment.  You need to re-schedule your appointment if you arrive 10 or more minutes late.  We strive to give you quality time with our providers, and arriving late affects you and other patients whose appointments are after yours.  Also, if you no show three or more times for appointments you may be dismissed from the clinic.  Again, thank you for choosing [facility] at [facility]. Our hope is that these requests will allow you access to exceptional care and in a timely manner. _______________________________________________________________  If you have questions after your visit, please contact our office at [phone] between the hours of 8:30 a.m. and 5:00 p.m. Voicemails left after 4:30 p.m. will not be returned until the following business day. _______________________________________________________________  For prescription refill requests, have your pharmacy contact our office. _______________________________________________________________  Recommendations made by the consultant and any test results will be sent to your referring physician. _______________________________________________________________ 

## 2018-05-26 NOTE — Progress Notes (Signed)
Diagnosis Anemia associated with chronic renal failure - Plan: CBC with Differential/Platelet, CBC with Differential/Platelet, Comprehensive metabolic panel, Lactate dehydrogenase, Ferritin  Staging Cancer Staging No matching staging information was found for the patient.  Assessment and Plan:  1.  Anemia secondary to stage IV CKD.  Pt is on Aranesp every 3-4 weeks.  Treatment will be held if Hb greater than or equal to 10 as nephrologist does not want her hemoglobin higher than 10 due to issues with hypertension.  Last Aranesp was 03/03/2018.  Labs done 05/26/2018 reviewed and showed WBC 7.3 HB 9.7 plts 171,000.  Chemistries WNL with K+ 4 and normal LFTs. She will receive Aranesp today.   Pt will continue monthly CBC and possible Aranesp.  She will have follow-up with labs in 09/2018.   2.  AVMs/esophageal varices.  Pt should contiue to follow-up with GI as recommended.  Will check ferritin in 09/2018  3.  CKD.  Continue to follow-up with nephrology as directed.  Cr 3.64 on labs done 05/26/2018.  SPEP in 2017 was negative.    4.  Itching.  May be due to RI.  Cr 3.64 on labs done 05/26/2018.  Pt should continue to follow-up with nephrology as directed.   5.  HTN.  BP is 119/63.  Follow-up with PCP as directed.    6.  Smoking.  Cessation recommended.  Pt given option for lung cancer screening evaluation and she does not desire that recommendation.    7.  Health maintenance.  Continue GI and mammogram screening as recommended.    Interval History:  63 yr old female previously followed by Dr. Talbert Cage for anemia of chronic renal disease.  Pt has history of CHF, MI, HTN, asthma, obesity, AVMs and esophageal varices.     Current Status:  Pt is sheen today for follow-up.  She is here to go over labs.  She continues to follow-up with Dr. Hinda Lenis.  Problem List Patient Active Problem List   Diagnosis Date Noted  . Anemia associated with chronic renal failure [N18.9, D63.1] 11/10/2015  . Anemia of  renal disease [N18.9, D63.1] 11/10/2015  . Cardiomyopathy ([place]) [I42.9] 04/21/2013  . HTN (hypertension) [I10] 04/21/2013  . Hyperlipidemia [E78.5] 04/21/2013  . Tobacco abuse [Z72.0] 04/21/2013  . End stage renal disease ([place]) [N18.6] 08/06/2012  . Chronic kidney disease, stage IV (severe) ([place]) [N18.4] 07/09/2012    Past Medical History Past Medical History:  Diagnosis Date  . Absolute anemia 11/10/2015  . Anemia   . Anemia of renal disease 11/10/2015  . Asthma    has Albuterol inhaler prn  . CHF (congestive heart failure) ([place])    acute systolic CHF 10/[facility])  . Chronic kidney disease    Membranous nephropathy ( Seen at [facility])  . Gastric ulcer   . History of blood transfusion    no abnormal reaction noted  . History of bronchitis    last time 3-66yrs ago  . Hyperlipidemia    takes Pravastatin daily  . Hypertension    takes Imdur and Hydralazine daily as well as Coreg  . Hypothyroidism    takes Synthroid daily  . Itching    on legs-has a Kenalog cream  . Metabolic bone disease   . Nausea    takes Zofran prn  . Nonischemic cardiomyopathy ([place])    12/2011 normal coronaries, EF 22% (EF normal 04/2013)  . Obesity   . Peripheral edema    takes Torsemide daily  . Thyroid disease    hypothyroidism  Past Surgical History Past Surgical History:  Procedure Laterality Date  . AV FISTULA PLACEMENT Left 07/22/2012   Procedure: INSERTION OF ARTERIOVENOUS GORE-TEX GRAFT ARM;  Surgeon: Angelia Mould, MD;  Location: [place];  Service: Vascular;  Laterality: Left;  . AV FISTULA PLACEMENT Right 05/24/2017   Procedure: INSERTION OF ARTERIOVENOUS (AV) GORE-TEX GRAFT RIGHT ARM;  Surgeon: Angelia Mould, MD;  Location: [place];  Service: Vascular;  Laterality: Right;  . CESAREAN SECTION     30+yrs ago  . COLONOSCOPY    . left arm surgery with pin     15+yrs ago  . TUBAL LIGATION      Family History Family History  Problem Relation Age of Onset  . Kidney  disease Other   . Diabetes Mother      Social History  reports that she has been smoking cigarettes. She started smoking about 50 years ago. She has a 40.00 pack-year smoking history. She has never used smokeless tobacco. She reports that she does not drink alcohol or use drugs.  Medications  Current Outpatient Medications:  .  acetaminophen (TYLENOL) 500 MG tablet, Take 1,000 mg by mouth daily as needed for moderate pain or headache., Disp: , Rfl:  .  albuterol (PROVENTIL HFA;VENTOLIN HFA) 108 (90 BASE) MCG/ACT inhaler, Inhale 1-2 puffs into the lungs every 6 (six) hours as needed for wheezing. , Disp: , Rfl:  .  carvedilol (COREG) 25 MG tablet, Take 25 mg by mouth 2 (two) times daily. , Disp: , Rfl:  .  clobetasol cream (TEMOVATE) 8.67 %, Apply 1 application topically daily as needed (rash). , Disp: , Rfl:  .  hydrALAZINE (APRESOLINE) 50 MG tablet, Take 50 mg by mouth every 8 (eight) hours., Disp: , Rfl:  .  isosorbide mononitrate (IMDUR) 30 MG 24 hr tablet, Take 30 mg by mouth daily., Disp: , Rfl:  .  metolazone (ZAROXOLYN) 5 MG tablet, Take 5 mg by mouth 2 (two) times daily as needed (swelling). , Disp: , Rfl:  .  pravastatin (PRAVACHOL) 80 MG tablet, Take 80 mg by mouth daily., Disp: , Rfl:  .  raloxifene (EVISTA) 60 MG tablet, Take 60 mg by mouth daily., Disp: , Rfl:  .  RAYALDEE 30 MCG CPCR, Take 30 mcg by mouth at bedtime. , Disp: , Rfl:  .  sodium bicarbonate 650 MG tablet, Take 1,300 mg by mouth 3 (three) times daily. , Disp: , Rfl:  .  torsemide (DEMADEX) 20 MG tablet, Take 20 mg by mouth daily as needed (swelling). , Disp: , Rfl:   Allergies Corticosteroids and Rituximab  Review of Systems Review of Systems - Oncology ROS negative other than itching   Physical Exam  Vitals Wt Readings from Last 3 Encounters:  05/26/18 230 lb 3.2 oz (104.4 kg)  12/09/17 230 lb 9.6 oz (104.6 kg)  06/21/17 237 lb 6.4 oz (107.7 kg)   Temp Readings from Last 3 Encounters:  05/26/18 98.1  F (36.7 C) (Oral)  03/03/18 98 F (36.7 C) (Oral)  02/03/18 98 F (36.7 C) (Oral)   BP Readings from Last 3 Encounters:  05/26/18 119/63  03/03/18 (!) 128/55  02/03/18 (!) 164/66   Pulse Readings from Last 3 Encounters:  05/26/18 60  03/03/18 65  02/03/18 61   Constitutional: Well-developed, well-nourished, and in no distress.   HENT: Head: Normocephalic and atraumatic.  Mouth/Throat: No oropharyngeal exudate. Mucosa moist. Eyes: Pupils are equal, round, and reactive to light. Conjunctivae are normal. No scleral icterus.  Neck: Normal range of motion. Neck supple. No JVD present.  Cardiovascular: Normal rate, regular rhythm and normal heart sounds.  Exam reveals no gallop and no friction rub.   No murmur heard. Pulmonary/Chest: Effort normal and breath sounds normal. No respiratory distress. No wheezes.No rales.  Abdominal: Soft. Bowel sounds are normal. No distension. There is no tenderness. There is no guarding.  Musculoskeletal: No edema or tenderness.  Lymphadenopathy: No cervical, axillary or supraclavicular adenopathy.  Neurological: Alert and oriented to person, place, and time. No cranial nerve deficit.  Skin: Skin is warm and dry. No rash noted. No erythema. No pallor.  Psychiatric: Affect and judgment normal.   Labs Appointment on 05/26/2018  Component Date Value Ref Range Status  . WBC 05/26/2018 7.3  4.0 - 10.5 K/uL Final  . RBC 05/26/2018 3.15* 3.87 - 5.11 MIL/uL Final  . Hemoglobin 05/26/2018 9.7* 12.0 - 15.0 g/dL Final  . HCT 05/26/2018 31.7* 36.0 - 46.0 % Final  . MCV 05/26/2018 100.6* 80.0 - 100.0 fL Final  . MCH 05/26/2018 30.8  26.0 - 34.0 pg Final  . MCHC 05/26/2018 30.6  30.0 - 36.0 g/dL Final  . RDW 05/26/2018 13.7  11.5 - 15.5 % Final  . Platelets 05/26/2018 171  150 - 400 K/uL Final  . nRBC 05/26/2018 0.0  0.0 - 0.2 % Final  . Neutrophils Relative % 05/26/2018 73  % Final  . Neutro Abs 05/26/2018 5.3  1.7 - 7.7 K/uL Final  . Lymphocytes Relative  05/26/2018 19  % Final  . Lymphs Abs 05/26/2018 1.3  0.7 - 4.0 K/uL Final  . Monocytes Relative 05/26/2018 4  % Final  . Monocytes Absolute 05/26/2018 0.3  0.1 - 1.0 K/uL Final  . Eosinophils Relative 05/26/2018 3  % Final  . Eosinophils Absolute 05/26/2018 0.2  0.0 - 0.5 K/uL Final  . Basophils Relative 05/26/2018 0  % Final  . Basophils Absolute 05/26/2018 0.0  0.0 - 0.1 K/uL Final  . Immature Granulocytes 05/26/2018 1  % Final  . Abs Immature Granulocytes 05/26/2018 0.04  0.00 - 0.07 K/uL Final   Performed at [facility], [address], [place], [place] [address]  . Sodium 05/26/2018 141  135 - 145 mmol/L Final  . Potassium 05/26/2018 4.0  3.5 - 5.1 mmol/L Final  . Chloride 05/26/2018 113* 98 - 111 mmol/L Final  . CO2 05/26/2018 21* 22 - 32 mmol/L Final  . Glucose, Bld 05/26/2018 101* 70 - 99 mg/dL Final  . BUN 05/26/2018 37* 8 - 23 mg/dL Final  . Creatinine, Ser 05/26/2018 3.64* 0.44 - 1.00 mg/dL Final  . Calcium 05/26/2018 8.9  8.9 - 10.3 mg/dL Final  . Total Protein 05/26/2018 6.5  6.5 - 8.1 g/dL Final  . Albumin 05/26/2018 3.4* 3.5 - 5.0 g/dL Final  . AST 05/26/2018 15  15 - 41 U/L Final  . ALT 05/26/2018 12  0 - 44 U/L Final  . Alkaline Phosphatase 05/26/2018 60  38 - 126 U/L Final  . Total Bilirubin 05/26/2018 0.7  0.3 - 1.2 mg/dL Final  . GFR calc non Af Amer 05/26/2018 13* >60 mL/min Final  . GFR calc Af Amer 05/26/2018 15* >60 mL/min Final  . Anion gap 05/26/2018 7  5 - 15 Final   Performed at [facility], [address], [place], [place] [address]  . LDH 05/26/2018 135  98 - 192 U/L Final   Performed at [facility], [address], [place], [place] [address]  Pathology Orders Placed This Encounter  Procedures  . CBC with Differential/Platelet    Standing Status:   Standing    Number of Occurrences:   6    Standing Expiration Date:   10/25/2018  . CBC with Differential/Platelet    Standing Status:   Future    Standing Expiration Date:   05/27/2019  .  Comprehensive metabolic panel    Standing Status:   Future    Standing Expiration Date:   05/27/2019  . Lactate dehydrogenase    Standing Status:   Future    Standing Expiration Date:   05/27/2019  . Ferritin    Standing Status:   Future    Standing Expiration Date:   05/27/2019       Zoila Shutter MD

## 2018-05-26 NOTE — Patient Instructions (Signed)
[place] at [facility]  Discharge Instructions:  You received Aranesp today _______________________________________________________________  Thank you for choosing [place] at [facility] to provide your oncology and hematology care.  To afford each patient quality time with our providers, please arrive at least 15 minutes before your scheduled appointment.  You need to re-schedule your appointment if you arrive 10 or more minutes late.  We strive to give you quality time with our providers, and arriving late affects you and other patients whose appointments are after yours.  Also, if you no show three or more times for appointments you may be dismissed from the clinic.  Again, thank you for choosing [place] at [place] hope is that these requests will allow you access to exceptional care and in a timely manner. _______________________________________________________________  If you have questions after your visit, please contact our office at [phone] between the hours of 8:30 a.m. and 5:00 p.m. Voicemails left after 4:30 p.m. will not be returned until the following business day. _______________________________________________________________  For prescription refill requests, have your pharmacy contact our office. _______________________________________________________________  Recommendations made by the consultant and any test results will be sent to your referring physician. _______________________________________________________________

## 2018-06-23 ENCOUNTER — Inpatient Hospital Stay (HOSPITAL_COMMUNITY): Payer: Medicare Other

## 2018-06-23 ENCOUNTER — Inpatient Hospital Stay (HOSPITAL_COMMUNITY): Payer: Medicare Other | Attending: Hematology

## 2018-06-23 DIAGNOSIS — N189 Chronic kidney disease, unspecified: Secondary | ICD-10-CM

## 2018-06-23 DIAGNOSIS — D631 Anemia in chronic kidney disease: Secondary | ICD-10-CM

## 2018-06-23 LAB — CBC WITH DIFFERENTIAL/PLATELET
Abs Immature Granulocytes: 0.09 10*3/uL — ABNORMAL HIGH (ref 0.00–0.07)
Basophils Absolute: 0 10*3/uL (ref 0.0–0.1)
Basophils Relative: 0 %
EOS PCT: 2 %
Eosinophils Absolute: 0.2 10*3/uL (ref 0.0–0.5)
HCT: 35.3 % — ABNORMAL LOW (ref 36.0–46.0)
Hemoglobin: 10.4 g/dL — ABNORMAL LOW (ref 12.0–15.0)
Immature Granulocytes: 1 %
Lymphocytes Relative: 16 %
Lymphs Abs: 1.8 10*3/uL (ref 0.7–4.0)
MCH: 30.2 pg (ref 26.0–34.0)
MCHC: 29.5 g/dL — AB (ref 30.0–36.0)
MCV: 102.6 fL — ABNORMAL HIGH (ref 80.0–100.0)
Monocytes Absolute: 0.6 10*3/uL (ref 0.1–1.0)
Monocytes Relative: 5 %
Neutro Abs: 8.6 10*3/uL — ABNORMAL HIGH (ref 1.7–7.7)
Neutrophils Relative %: 76 %
Platelets: 175 10*3/uL (ref 150–400)
RBC: 3.44 MIL/uL — AB (ref 3.87–5.11)
RDW: 14.2 % (ref 11.5–15.5)
WBC: 11.2 10*3/uL — ABNORMAL HIGH (ref 4.0–10.5)
nRBC: 0 % (ref 0.0–0.2)

## 2018-06-23 LAB — COMPREHENSIVE METABOLIC PANEL
ALT: 17 U/L (ref 0–44)
AST: 14 U/L — ABNORMAL LOW (ref 15–41)
Albumin: 3.3 g/dL — ABNORMAL LOW (ref 3.5–5.0)
Alkaline Phosphatase: 63 U/L (ref 38–126)
Anion gap: 7 (ref 5–15)
BUN: 48 mg/dL — ABNORMAL HIGH (ref 8–23)
CHLORIDE: 111 mmol/L (ref 98–111)
CO2: 23 mmol/L (ref 22–32)
Calcium: 8.6 mg/dL — ABNORMAL LOW (ref 8.9–10.3)
Creatinine, Ser: 4.32 mg/dL — ABNORMAL HIGH (ref 0.44–1.00)
GFR calc Af Amer: 12 mL/min — ABNORMAL LOW (ref >60)
GFR calc non Af Amer: 10 mL/min — ABNORMAL LOW (ref >60)
Glucose, Bld: 99 mg/dL (ref 70–99)
Potassium: 4.3 mmol/L (ref 3.5–5.1)
Sodium: 141 mmol/L (ref 135–145)
Total Bilirubin: 0.4 mg/dL (ref 0.3–1.2)
Total Protein: 6.6 g/dL (ref 6.5–8.1)

## 2018-06-23 LAB — FERRITIN: Ferritin: 297 ng/mL (ref 11–307)

## 2018-06-23 LAB — LACTATE DEHYDROGENASE: LDH: 139 U/L (ref 98–192)

## 2018-06-23 NOTE — Progress Notes (Signed)
Pt here today for monthly Aranesp injections. Pt's Hgb today is 10.4. Aranesp injection held today per protocol. Pt to return in 1 month for monthly injections.

## 2018-07-21 ENCOUNTER — Other Ambulatory Visit: Payer: Self-pay

## 2018-07-21 ENCOUNTER — Inpatient Hospital Stay (HOSPITAL_COMMUNITY): Payer: Medicare Other | Attending: Hematology

## 2018-07-21 ENCOUNTER — Inpatient Hospital Stay (HOSPITAL_COMMUNITY): Payer: Medicare Other

## 2018-07-21 ENCOUNTER — Encounter (HOSPITAL_COMMUNITY): Payer: Self-pay

## 2018-07-21 VITALS — BP 145/65 | HR 68 | Temp 98.1°F | Resp 20

## 2018-07-21 DIAGNOSIS — N189 Chronic kidney disease, unspecified: Secondary | ICD-10-CM

## 2018-07-21 DIAGNOSIS — D631 Anemia in chronic kidney disease: Secondary | ICD-10-CM

## 2018-07-21 DIAGNOSIS — N184 Chronic kidney disease, stage 4 (severe): Secondary | ICD-10-CM

## 2018-07-21 LAB — CBC
HCT: 31.8 % — ABNORMAL LOW (ref 36.0–46.0)
Hemoglobin: 9.6 g/dL — ABNORMAL LOW (ref 12.0–15.0)
MCH: 30.8 pg (ref 26.0–34.0)
MCHC: 30.2 g/dL (ref 30.0–36.0)
MCV: 101.9 fL — AB (ref 80.0–100.0)
Platelets: 197 10*3/uL (ref 150–400)
RBC: 3.12 MIL/uL — ABNORMAL LOW (ref 3.87–5.11)
RDW: 13.6 % (ref 11.5–15.5)
WBC: 6.8 10*3/uL (ref 4.0–10.5)
nRBC: 0 % (ref 0.0–0.2)

## 2018-07-21 MED ORDER — DARBEPOETIN ALFA 150 MCG/0.3ML IJ SOSY
150.0000 ug | PREFILLED_SYRINGE | Freq: Once | INTRAMUSCULAR | Status: AC
  Administered 2018-07-21: 150 ug via SUBCUTANEOUS
  Filled 2018-07-21: qty 0.3

## 2018-07-21 MED ORDER — HEPARIN SOD (PORK) LOCK FLUSH 100 UNIT/ML IV SOLN
INTRAVENOUS | Status: AC
  Filled 2018-07-21: qty 5

## 2018-07-21 NOTE — Patient Instructions (Signed)
[facility] at [facility]  Discharge Instructions:   _______________________________________________________________  Thank you for choosing [facility] to provide your oncology and hematology care.  To afford each patient quality time with our providers, please arrive at least 15 minutes before your scheduled appointment.  You need to re-schedule your appointment if you arrive 10 or more minutes late.  We strive to give you quality time with our providers, and arriving late affects you and other patients whose appointments are after yours.  Also, if you no show three or more times for appointments you may be dismissed from the clinic.  Again, thank you for choosing [facility] at  Hospital. Our hope is that these requests will allow you access to exceptional care and in a timely manner. _______________________________________________________________  If you have questions after your visit, please contact our office at [phone] between the hours of 8:30 a.m. and 5:00 p.m. Voicemails left after 4:30 p.m. will not be returned until the following business day. _______________________________________________________________  For prescription refill requests, have your pharmacy contact our office. _______________________________________________________________  Recommendations made by the consultant and any test results will be sent to your referring physician. _______________________________________________________________ 

## 2018-07-21 NOTE — Progress Notes (Signed)
Patient tolerated injection with no complaints voiced.  Site clean and dry with no bruising or swelling noted at site.  Band aid applied.  Vss with discharge and left ambulatory with no s/s of distress noted.  

## 2018-08-18 ENCOUNTER — Inpatient Hospital Stay (HOSPITAL_COMMUNITY): Payer: Medicare Other | Attending: Hematology

## 2018-08-18 ENCOUNTER — Inpatient Hospital Stay (HOSPITAL_COMMUNITY): Payer: Medicare Other

## 2018-08-18 ENCOUNTER — Other Ambulatory Visit: Payer: Self-pay

## 2018-08-18 DIAGNOSIS — D631 Anemia in chronic kidney disease: Secondary | ICD-10-CM | POA: Diagnosis not present

## 2018-08-18 DIAGNOSIS — N189 Chronic kidney disease, unspecified: Secondary | ICD-10-CM | POA: Diagnosis not present

## 2018-08-18 LAB — CBC WITH DIFFERENTIAL/PLATELET
Abs Immature Granulocytes: 0.03 10*3/uL (ref 0.00–0.07)
Basophils Absolute: 0 10*3/uL (ref 0.0–0.1)
Basophils Relative: 0 %
Eosinophils Absolute: 0.3 10*3/uL (ref 0.0–0.5)
Eosinophils Relative: 4 %
HCT: 33.3 % — ABNORMAL LOW (ref 36.0–46.0)
Hemoglobin: 10.1 g/dL — ABNORMAL LOW (ref 12.0–15.0)
Immature Granulocytes: 0 %
Lymphocytes Relative: 21 %
Lymphs Abs: 1.6 10*3/uL (ref 0.7–4.0)
MCH: 30.4 pg (ref 26.0–34.0)
MCHC: 30.3 g/dL (ref 30.0–36.0)
MCV: 100.3 fL — ABNORMAL HIGH (ref 80.0–100.0)
Monocytes Absolute: 0.4 10*3/uL (ref 0.1–1.0)
Monocytes Relative: 5 %
Neutro Abs: 5.3 10*3/uL (ref 1.7–7.7)
Neutrophils Relative %: 70 %
Platelets: 210 10*3/uL (ref 150–400)
RBC: 3.32 MIL/uL — ABNORMAL LOW (ref 3.87–5.11)
RDW: 13.5 % (ref 11.5–15.5)
WBC: 7.7 10*3/uL (ref 4.0–10.5)
nRBC: 0 % (ref 0.0–0.2)

## 2018-08-18 NOTE — Progress Notes (Signed)
Aranesp held for hgb greater than 10.

## 2018-09-09 ENCOUNTER — Other Ambulatory Visit: Payer: Self-pay

## 2018-09-09 DIAGNOSIS — Z992 Dependence on renal dialysis: Secondary | ICD-10-CM

## 2018-09-09 DIAGNOSIS — N186 End stage renal disease: Secondary | ICD-10-CM

## 2018-09-15 ENCOUNTER — Inpatient Hospital Stay (HOSPITAL_COMMUNITY): Payer: Medicare Other | Attending: Hematology

## 2018-09-15 ENCOUNTER — Other Ambulatory Visit: Payer: Self-pay

## 2018-09-15 ENCOUNTER — Inpatient Hospital Stay (HOSPITAL_COMMUNITY): Payer: Medicare Other

## 2018-09-15 ENCOUNTER — Encounter (HOSPITAL_COMMUNITY): Payer: Self-pay | Admitting: Hematology

## 2018-09-15 ENCOUNTER — Inpatient Hospital Stay (HOSPITAL_BASED_OUTPATIENT_CLINIC_OR_DEPARTMENT_OTHER): Payer: Medicare Other | Admitting: Hematology

## 2018-09-15 VITALS — BP 127/57 | HR 66 | Temp 98.5°F | Resp 18 | Wt 227.8 lb

## 2018-09-15 DIAGNOSIS — D631 Anemia in chronic kidney disease: Secondary | ICD-10-CM | POA: Insufficient documentation

## 2018-09-15 DIAGNOSIS — N189 Chronic kidney disease, unspecified: Secondary | ICD-10-CM

## 2018-09-15 LAB — CBC WITH DIFFERENTIAL/PLATELET
Abs Immature Granulocytes: 0.02 10*3/uL (ref 0.00–0.07)
Basophils Absolute: 0 10*3/uL (ref 0.0–0.1)
Basophils Relative: 0 %
Eosinophils Absolute: 0.3 10*3/uL (ref 0.0–0.5)
Eosinophils Relative: 4 %
HCT: 31.6 % — ABNORMAL LOW (ref 36.0–46.0)
Hemoglobin: 9.8 g/dL — ABNORMAL LOW (ref 12.0–15.0)
Immature Granulocytes: 0 %
Lymphocytes Relative: 18 %
Lymphs Abs: 1.3 10*3/uL (ref 0.7–4.0)
MCH: 30.7 pg (ref 26.0–34.0)
MCHC: 31 g/dL (ref 30.0–36.0)
MCV: 99.1 fL (ref 80.0–100.0)
Monocytes Absolute: 0.4 10*3/uL (ref 0.1–1.0)
Monocytes Relative: 6 %
Neutro Abs: 5.1 10*3/uL (ref 1.7–7.7)
Neutrophils Relative %: 72 %
Platelets: 201 10*3/uL (ref 150–400)
RBC: 3.19 MIL/uL — ABNORMAL LOW (ref 3.87–5.11)
RDW: 13.6 % (ref 11.5–15.5)
WBC: 7.1 10*3/uL (ref 4.0–10.5)
nRBC: 0 % (ref 0.0–0.2)

## 2018-09-15 MED ORDER — DARBEPOETIN ALFA 150 MCG/0.3ML IJ SOSY
PREFILLED_SYRINGE | INTRAMUSCULAR | Status: AC
  Filled 2018-09-15: qty 0.3

## 2018-09-15 MED ORDER — DARBEPOETIN ALFA 150 MCG/0.3ML IJ SOSY
150.0000 ug | PREFILLED_SYRINGE | Freq: Once | INTRAMUSCULAR | Status: AC
  Administered 2018-09-15: 11:00:00 150 ug via SUBCUTANEOUS

## 2018-09-15 NOTE — Progress Notes (Signed)
Shannon [name] tolerated Aranesp injection without incident or complaint. VSS. Hgb 9.8. Discharged self ambulatory in satisfactory condition.

## 2018-09-15 NOTE — Patient Instructions (Signed)
[place] at [facility]  Discharge Instructions:  Aranesp injection received today. Your hemoglobin was 9.8. _______________________________________________________________  Thank you for choosing [place] at [facility] to provide your oncology and hematology care.  To afford each patient quality time with our providers, please arrive at least 15 minutes before your scheduled appointment.  You need to re-schedule your appointment if you arrive 10 or more minutes late.  We strive to give you quality time with our providers, and arriving late affects you and other patients whose appointments are after yours.  Also, if you no show three or more times for appointments you may be dismissed from the clinic.  Again, thank you for choosing [place] at [place] hope is that these requests will allow you access to exceptional care and in a timely manner. _______________________________________________________________  If you have questions after your visit, please contact our office at [phone] between the hours of 8:30 a.m. and 5:00 p.m. Voicemails left after 4:30 p.m. will not be returned until the following business day. _______________________________________________________________  For prescription refill requests, have your pharmacy contact our office. _______________________________________________________________  Recommendations made by the consultant and any test results will be sent to your referring physician. _______________________________________________________________

## 2018-09-15 NOTE — Progress Notes (Signed)
[place], [place] [address]   CLINIC:  Medical Oncology/Hematology  PCP:  [name], MD [place] [address] [phone]   REASON FOR VISIT:  Follow-up for Anemia secondary to CKD  CURRENT THERAPY: Aranesp   BRIEF ANEMIA HISTORY:  Presents today for follow up of anemia secondary to CKD. Reports overall doing well. Denies any significant fatigue. Denies any episodes of bleeding. Denies any significant weight loss or weight gain. No neuropathies. No bony pain. No CP, SOB, lightheadedness or dizziness.        REVIEW OF SYSTEMS:  Review of Systems  Constitutional: Negative.   HENT:  Negative.   Eyes: Negative.   Respiratory: Negative.   Cardiovascular: Negative.   Gastrointestinal: Negative.   Endocrine: Negative.   Genitourinary: Negative.    Musculoskeletal: Negative.   Skin: Negative.   Neurological: Negative.   Hematological: Negative.   Psychiatric/Behavioral: Negative.      PAST MEDICAL/SURGICAL HISTORY:  Past Medical History:  Diagnosis Date  . Absolute anemia 11/10/2015  . Anemia   . Anemia of renal disease 11/10/2015  . Asthma    has Albuterol inhaler prn  . CHF (congestive heart failure) ([place])    acute systolic CHF 06/1192 [facility])  . Chronic kidney disease    Membranous nephropathy ( Seen at [facility])  . Gastric ulcer   . History of blood transfusion    no abnormal reaction noted  . History of bronchitis    last time 3-29yrs ago  . Hyperlipidemia    takes Pravastatin daily  . Hypertension    takes Imdur and Hydralazine daily as well as Coreg  . Hypothyroidism    takes Synthroid daily  . Itching    on legs-has a Kenalog cream  . Metabolic bone disease   . Nausea    takes Zofran prn  . Nonischemic cardiomyopathy ([place])    12/2011 normal coronaries, EF 22% (EF normal 04/2013)  . Obesity   . Peripheral edema    takes Torsemide daily  . Thyroid disease    hypothyroidism   Past Surgical  History:  Procedure Laterality Date  . AV FISTULA PLACEMENT Left 07/22/2012   Procedure: INSERTION OF ARTERIOVENOUS GORE-TEX GRAFT ARM;  Surgeon: Angelia Mould, MD;  Location: [facility];  Service: Vascular;  Laterality: Left;  . AV FISTULA PLACEMENT Right 05/24/2017   Procedure: INSERTION OF ARTERIOVENOUS (AV) GORE-TEX GRAFT RIGHT ARM;  Surgeon: Angelia Mould, MD;  Location: [facility];  Service: Vascular;  Laterality: Right;  . CESAREAN SECTION     30+yrs ago  . COLONOSCOPY    . left arm surgery with pin     15+yrs ago  . TUBAL LIGATION       SOCIAL HISTORY:  Social History   Socioeconomic History  . Marital status: Single    Spouse name: Not on file  . Number of children: Not on file  . Years of education: Not on file  . Highest education level: Not on file  Occupational History  . Not on file  Social Needs  . Financial resource strain: Not on file  . Food insecurity:    Worry: Not on file    Inability: Not on file  . Transportation needs:    Medical: Not on file    Non-medical: Not on file  Tobacco Use  . Smoking status: Current Every Day Smoker    Packs/day: 1.00    Years: 40.00  Pack years: 40.00    Types: Cigarettes    Start date: 05/14/1968  . Smokeless tobacco: Never Used  Substance and Sexual Activity  . Alcohol use: No  . Drug use: No  . Sexual activity: Not Currently  Lifestyle  . Physical activity:    Days per week: Not on file    Minutes per session: Not on file  . Stress: Not on file  Relationships  . Social connections:    Talks on phone: Not on file    Gets together: Not on file    Attends religious service: Not on file    Active member of club or organization: Not on file    Attends meetings of clubs or organizations: Not on file    Relationship status: Not on file  . Intimate partner violence:    Fear of current or ex partner: Not on file    Emotionally abused: Not on file    Physically abused: Not on file    Forced sexual  activity: Not on file  Other Topics Concern  . Not on file  Social History Narrative  . Not on file    FAMILY HISTORY:  Family History  Problem Relation Age of Onset  . Kidney disease Other   . Diabetes Mother     CURRENT MEDICATIONS:  Outpatient Encounter Medications as of 09/15/2018  Medication Sig  . acetaminophen (TYLENOL) 500 MG tablet Take 1,000 mg by mouth daily as needed for moderate pain or headache.  . albuterol (PROVENTIL HFA;VENTOLIN HFA) 108 (90 BASE) MCG/ACT inhaler Inhale 1-2 puffs into the lungs every 6 (six) hours as needed for wheezing.   . carvedilol (COREG) 25 MG tablet Take 25 mg by mouth 2 (two) times daily.   . clobetasol cream (TEMOVATE) 3.01 % Apply 1 application topically daily as needed (rash).   . hydrALAZINE (APRESOLINE) 50 MG tablet Take 50 mg by mouth every 8 (eight) hours.  . isosorbide mononitrate (IMDUR) 30 MG 24 hr tablet Take 30 mg by mouth daily.  . metolazone (ZAROXOLYN) 5 MG tablet Take 5 mg by mouth 2 (two) times daily as needed (swelling).   . pravastatin (PRAVACHOL) 80 MG tablet Take 80 mg by mouth daily.  . raloxifene (EVISTA) 60 MG tablet Take 60 mg by mouth daily.  Marland Kitchen RAYALDEE 30 MCG CPCR Take 30 mcg by mouth at bedtime.   . sodium bicarbonate 650 MG tablet Take 1,300 mg by mouth 3 (three) times daily.   Marland Kitchen torsemide (DEMADEX) 20 MG tablet Take 20 mg by mouth daily as needed (swelling).    No facility-administered encounter medications on file as of 09/15/2018.     ALLERGIES:  Allergies  Allergen Reactions  . Corticosteroids Hives, Itching, Nausea And Vomiting and Rash    Tolerated dexamethasone when being treated for a reaction to rituximab  . Rituximab Shortness Of Breath    wheezing wheezing      PHYSICAL EXAM:  ECOG Performance status: 1  Vitals:   09/15/18 1037  BP: (!) 127/57  Pulse: 66  Resp: 18  Temp: 98.5 F (36.9 C)  SpO2: 97%   Filed Weights   09/15/18 1037  Weight: 227 lb 12.8 oz (103.3 kg)    Physical  Exam Vitals signs reviewed.  Constitutional:      Appearance: Normal appearance.  Cardiovascular:     Rate and Rhythm: Normal rate and regular rhythm.     Heart sounds: Normal heart sounds.  Pulmonary:     Effort: Pulmonary effort  is normal.     Breath sounds: Normal breath sounds.  Abdominal:     General: There is no distension.     Palpations: Abdomen is soft. There is no mass.  Musculoskeletal:        General: No swelling.  Skin:    General: Skin is warm.  Neurological:     General: No focal deficit present.     Mental Status: She is alert and oriented to person, place, and time.  Psychiatric:        Mood and Affect: Mood normal.        Behavior: Behavior normal.    Right forearm fistula present.  LABORATORY DATA:  I have reviewed the labs as listed.  CBC    Component Value Date/Time   WBC 7.1 09/15/2018 1010   RBC 3.19 (L) 09/15/2018 1010   HGB 9.8 (L) 09/15/2018 1010   HCT 31.6 (L) 09/15/2018 1010   PLT 201 09/15/2018 1010   MCV 99.1 09/15/2018 1010   MCH 30.7 09/15/2018 1010   MCHC 31.0 09/15/2018 1010   RDW 13.6 09/15/2018 1010   LYMPHSABS 1.3 09/15/2018 1010   MONOABS 0.4 09/15/2018 1010   EOSABS 0.3 09/15/2018 1010   BASOSABS 0.0 09/15/2018 1010   CMP Latest Ref Rng & Units 06/23/2018 05/26/2018 08/23/2017  Glucose 70 - 99 mg/dL 99 101(H) 106(H)  BUN 8 - 23 mg/dL 48(H) 37(H) 49(H)  Creatinine 0.44 - 1.00 mg/dL 4.32(H) 3.64(H) 4.08(H)  Sodium 135 - 145 mmol/L 141 141 141  Potassium 3.5 - 5.1 mmol/L 4.3 4.0 4.3  Chloride 98 - 111 mmol/L 111 113(H) 107  CO2 22 - 32 mmol/L 23 21(L) 22  Calcium 8.9 - 10.3 mg/dL 8.6(L) 8.9 9.0  Total Protein 6.5 - 8.1 g/dL 6.6 6.5 -  Total Bilirubin 0.3 - 1.2 mg/dL 0.4 0.7 -  Alkaline Phos 38 - 126 U/L 63 60 -  AST 15 - 41 U/L 14(L) 15 -  ALT 0 - 44 U/L 17 12 -       DIAGNOSTIC IMAGING:  I have independently reviewed the scans and discussed with the patient.   I have reviewed Carver Fila, NP's note and agree with  the documentation.  I personally performed a face-to-face visit, made revisions and my assessment and plan is as follows.    ASSESSMENT & PLAN:   Anemia of renal disease 1.  Anemia due to CKD: - Patient on Aranesp started on 11/10/2015.  Currently on Aranesp 150 mcg every 28 days.  Last dose on 07/21/2018. - Last ferritin was 297 on 06/23/2018. -Last colonoscopy was reportedly done in [place], around 7 years ago. -Today hemoglobin was 9.8.  As per the chart, she also has a history of AVMs and esophageal varices. -She will continue Aranesp today and every 28 days. -She does have a right forearm fistula, but was not started on dialysis. -I plan to check her ferritin, iron panel, H82, folic acid and SPEP prior to next visit in 4 weeks.      Orders placed this encounter:  Orders Placed This Encounter  Procedures  . CBC with Differential  . Comprehensive metabolic panel  . Iron and TIBC  . Ferritin  . Protein electrophoresis, serum  . Vitamin B12  . Folate  . Soluble transferrin receptor      Derek Jack, MD [place] [phone]

## 2018-09-15 NOTE — Assessment & Plan Note (Addendum)
1.  Anemia due to CKD: - Patient on Aranesp started on 11/10/2015.  Currently on Aranesp 150 mcg every 28 days.  Last dose on 07/21/2018. - Last ferritin was 297 on 06/23/2018. -Last colonoscopy was reportedly done in [place], around 7 years ago. -Today hemoglobin was 9.8.  As per the chart, she also has a history of AVMs and esophageal varices. -She will continue Aranesp today and every 28 days. -She does have a right forearm fistula, but was not started on dialysis. -I plan to check her ferritin, iron panel, X61, folic acid and SPEP prior to next visit in 4 weeks.

## 2018-09-22 ENCOUNTER — Telehealth (HOSPITAL_COMMUNITY): Payer: Self-pay | Admitting: *Deleted

## 2018-09-22 NOTE — Telephone Encounter (Signed)
The above patient or their representative was contacted and gave the following answers to these questions:         Do you have any of the following symptoms?no  Fever                    Cough                   Shortness of breath  Do  you have any of the following other symptoms?    muscle pain         vomiting,        diarrhea        rash         weakness        red eye        abdominal pain         bruising          bruising or bleeding              joint pain           severe headache    Have you been in contact with someone who was or has been sick in the past 2 weeks?no  Yes                 Unsure                         Unable to assess   Does the person that you were in contact with have any of the following symptoms?   Cough         shortness of breath           muscle pain         vomiting,            diarrhea            rash            weakness           fever            red eye           abdominal pain           bruising  or  bleeding                joint pain                severe headache               Have you  or someone you have been in contact with traveled internationally in th last month?    no     If yes, which countries?   Have you  or someone you have been in contact with traveled outside [place] in th last month?     no    If yes, which state and city?   COMMENTS OR ACTION PLAN FOR THIS PATIENT:          

## 2018-09-22 NOTE — Telephone Encounter (Signed)
.  covisquestions

## 2018-09-24 ENCOUNTER — Encounter (HOSPITAL_COMMUNITY): Payer: Medicare Other

## 2018-09-24 ENCOUNTER — Encounter: Payer: Self-pay | Admitting: Vascular Surgery

## 2018-09-24 ENCOUNTER — Other Ambulatory Visit: Payer: Self-pay

## 2018-09-24 ENCOUNTER — Ambulatory Visit: Payer: Medicare Other | Admitting: Vascular Surgery

## 2018-09-24 ENCOUNTER — Other Ambulatory Visit (HOSPITAL_COMMUNITY): Payer: Medicare Other

## 2018-09-24 ENCOUNTER — Ambulatory Visit (INDEPENDENT_AMBULATORY_CARE_PROVIDER_SITE_OTHER)
Admission: RE | Admit: 2018-09-24 | Discharge: 2018-09-24 | Disposition: A | Discharge status: Home / Self Care | Payer: Medicare Other | Source: Ambulatory Visit | Attending: Vascular Surgery | Admitting: Vascular Surgery

## 2018-09-24 ENCOUNTER — Ambulatory Visit (HOSPITAL_COMMUNITY)
Admission: RE | Admit: 2018-09-24 | Discharge: 2018-09-24 | Disposition: A | Discharge status: Home / Self Care | Payer: Medicare Other | Source: Ambulatory Visit | Attending: Vascular Surgery | Admitting: Vascular Surgery

## 2018-09-24 VITALS — BP 126/69 | HR 62 | Temp 97.7°F | Resp 20 | Ht 63.0 in | Wt 232.0 lb

## 2018-09-24 DIAGNOSIS — Z992 Dependence on renal dialysis: Secondary | ICD-10-CM | POA: Diagnosis not present

## 2018-09-24 DIAGNOSIS — N186 End stage renal disease: Secondary | ICD-10-CM

## 2018-09-24 DIAGNOSIS — N184 Chronic kidney disease, stage 4 (severe): Secondary | ICD-10-CM | POA: Diagnosis not present

## 2018-09-24 NOTE — Progress Notes (Signed)
Patient name: Shannon [name] MRN: [id number] DOB: 1955/11/13 Sex: female  REASON FOR VISIT:   To evaluate for new access.  The consult is requested by Dr. Lowanda Foster.  HPI:   Shannon [name] is a pleasant 63 y.o. female who had a right forearm graft placed in January 2019.  She was not a candidate for a fistula in the right arm.  The patient had previously had a left upper arm graft placed in 2014.  She was not a candidate for a fistula in the left arm.  Of note the patient has chronic renal insufficiency secondary to membranous nephropathy.  The patient is not on dialysis.  She tells me that she thinks her forearm graft on the right occluded several months ago.  She has not had any recent uremic symptoms.  Specifically she denies nausea, vomiting, fatigue, anorexia, or palpitations. She has not previously had a catheter or a pacemaker.  According to the patient, if we were not able to salvage her current graft we were not to place new access at this time.  Past Medical History:  Diagnosis Date  . Absolute anemia 11/10/2015  . Anemia   . Anemia of renal disease 11/10/2015  . Asthma    has Albuterol inhaler prn  . CHF (congestive heart failure) ([place])    acute systolic CHF 09/91 [facility])  . Chronic kidney disease    Membranous nephropathy ( Seen at [facility])  . Gastric ulcer   . History of blood transfusion    no abnormal reaction noted  . History of bronchitis    last time 3-31yrs ago  . Hyperlipidemia    takes Pravastatin daily  . Hypertension    takes Imdur and Hydralazine daily as well as Coreg  . Hypothyroidism    takes Synthroid daily  . Itching    on legs-has a Kenalog cream  . Metabolic bone disease   . Nausea    takes Zofran prn  . Nonischemic cardiomyopathy ([place])    12/2011 normal coronaries, EF 22% (EF normal 04/2013)  . Obesity   . Peripheral edema    takes Torsemide daily  . Thyroid disease    hypothyroidism    Family History  Problem Relation Age of  Onset  . Kidney disease Other   . Diabetes Mother     SOCIAL HISTORY: Social History   Tobacco Use  . Smoking status: Current Every Day Smoker    Packs/day: 1.00    Years: 40.00    Pack years: 40.00    Types: Cigarettes    Start date: 05/14/1968  . Smokeless tobacco: Never Used  Substance Use Topics  . Alcohol use: No    Allergies  Allergen Reactions  . Corticosteroids Hives, Itching, Nausea And Vomiting and Rash    Tolerated dexamethasone when being treated for a reaction to rituximab  . Rituximab Shortness Of Breath    wheezing wheezing     Current Outpatient Medications  Medication Sig Dispense Refill  . acetaminophen (TYLENOL) 500 MG tablet Take 1,000 mg by mouth daily as needed for moderate pain or headache.    . albuterol (PROVENTIL HFA;VENTOLIN HFA) 108 (90 BASE) MCG/ACT inhaler Inhale 1-2 puffs into the lungs every 6 (six) hours as needed for wheezing.     . carvedilol (COREG) 25 MG tablet Take 25 mg by mouth 2 (two) times daily.     . clobetasol cream (TEMOVATE) 8.18 % Apply 1 application topically daily as needed (rash).     Marland Kitchen  hydrALAZINE (APRESOLINE) 50 MG tablet Take 50 mg by mouth every 8 (eight) hours.    . isosorbide mononitrate (IMDUR) 30 MG 24 hr tablet Take 30 mg by mouth daily.    . metolazone (ZAROXOLYN) 5 MG tablet Take 5 mg by mouth 2 (two) times daily as needed (swelling).     . pravastatin (PRAVACHOL) 80 MG tablet Take 80 mg by mouth daily.    . raloxifene (EVISTA) 60 MG tablet Take 60 mg by mouth daily.    Marland Kitchen RAYALDEE 30 MCG CPCR Take 30 mcg by mouth at bedtime.     . sodium bicarbonate 650 MG tablet Take 1,300 mg by mouth 3 (three) times daily.     Marland Kitchen torsemide (DEMADEX) 20 MG tablet Take 20 mg by mouth daily as needed (swelling).      No current facility-administered medications for this visit.     REVIEW OF SYSTEMS:  [X]  denotes positive finding, [ ]  denotes negative finding Cardiac  Comments:  Chest pain or chest pressure:    Shortness of  breath upon exertion:    Short of breath when lying flat:    Irregular heart rhythm:        Vascular    Pain in calf, thigh, or hip brought on by ambulation:    Pain in feet at night that wakes you up from your sleep:     Blood clot in your veins:    Leg swelling:         Pulmonary    Oxygen at home:    Productive cough:     Wheezing:         Neurologic    Sudden weakness in arms or legs:     Sudden numbness in arms or legs:     Sudden onset of difficulty speaking or slurred speech:    Temporary loss of vision in one eye:     Problems with dizziness:         Gastrointestinal    Blood in stool:     Vomited blood:         Genitourinary    Burning when urinating:     Blood in urine:        Psychiatric    Major depression:         Hematologic    Bleeding problems:    Problems with blood clotting too easily:        Skin    Rashes or ulcers:        Constitutional    Fever or chills:     PHYSICAL EXAM:   Vitals:   09/24/18 1213  BP: 126/69  Pulse: 62  Resp: 20  Temp: 97.7 F (36.5 C)  SpO2: 100%  Weight: 232 lb (105.2 kg)  Height: 5\' 3"  (1.6 m)    GENERAL: The patient is a well-nourished female, in no acute distress. The vital signs are documented above. CARDIAC: There is a regular rate and rhythm.  VASCULAR: I do not detect carotid bruits. She has palpable brachial and radial pulses bilaterally. PULMONARY: There is good air exchange bilaterally without wheezing or rales. ABDOMEN: Soft and non-tender with normal pitched bowel sounds.  MUSCULOSKELETAL: There are no major deformities or cyanosis. NEUROLOGIC: No focal weakness or paresthesias are detected. SKIN: There are no ulcers or rashes noted. PSYCHIATRIC: The patient has a normal affect.  DATA:    VEIN MAP: I have independently interpreted the vein map of both upper extremities that was done today.  On the right side the forearm cephalic vein and upper arm cephalic vein are not adequate for a  fistula.  The basilic vein is marginal in size on the right.  On the left side the forearm and upper arm cephalic vein are marginal in size.  The basilic vein on the left is small also.  ARTERIAL DUPLEX STUDY: I have independently interpreted her arterial duplex scan.  On the right side there is a triphasic radial and ulnar signal.  The brachial artery measures 0.37 cm in diameter.  On the left side there is a triphasic radial and ulnar signal.  The brachial artery measures 0.44 cm in diameter.  LABS: I reviewed her labs from 06/23/2018.  GFR was 12.  Creatinine was 4.3.  MEDICAL ISSUES:    STAGE IV CHRONIC KIDNEY DISEASE: Based on the patient's vein map there is only a small chance that the patient would be a candidate for a right basilic vein transposition.  Most likely, her next option for access would be a right upper arm graft.  However she is not yet on dialysis we have been asked to wait until she is closer to needing dialysis.  Certainly if her renal function worsens we could proceed with placement of new access in the right arm.  She will call if anything changes.  [name] [facility] [phone]

## 2018-10-13 ENCOUNTER — Inpatient Hospital Stay (HOSPITAL_COMMUNITY): Payer: Medicare Other | Attending: Hematology

## 2018-10-13 ENCOUNTER — Inpatient Hospital Stay (HOSPITAL_BASED_OUTPATIENT_CLINIC_OR_DEPARTMENT_OTHER): Payer: Medicare Other | Admitting: Hematology

## 2018-10-13 ENCOUNTER — Inpatient Hospital Stay (HOSPITAL_COMMUNITY): Payer: Medicare Other

## 2018-10-13 ENCOUNTER — Other Ambulatory Visit: Payer: Self-pay

## 2018-10-13 VITALS — BP 152/68 | HR 60 | Temp 98.4°F | Resp 18 | Wt 229.0 lb

## 2018-10-13 DIAGNOSIS — N189 Chronic kidney disease, unspecified: Secondary | ICD-10-CM | POA: Diagnosis present

## 2018-10-13 DIAGNOSIS — D631 Anemia in chronic kidney disease: Secondary | ICD-10-CM | POA: Insufficient documentation

## 2018-10-13 LAB — COMPREHENSIVE METABOLIC PANEL
ALT: 11 U/L (ref 0–44)
AST: 16 U/L (ref 15–41)
Albumin: 3.5 g/dL (ref 3.5–5.0)
Alkaline Phosphatase: 81 U/L (ref 38–126)
Anion gap: 11 (ref 5–15)
BUN: 42 mg/dL — ABNORMAL HIGH (ref 8–23)
CO2: 22 mmol/L (ref 22–32)
Calcium: 9 mg/dL (ref 8.9–10.3)
Chloride: 110 mmol/L (ref 98–111)
Creatinine, Ser: 3.99 mg/dL — ABNORMAL HIGH (ref 0.44–1.00)
GFR calc Af Amer: 13 mL/min — ABNORMAL LOW (ref >60)
GFR calc non Af Amer: 11 mL/min — ABNORMAL LOW (ref >60)
Glucose, Bld: 100 mg/dL — ABNORMAL HIGH (ref 70–99)
Potassium: 4.5 mmol/L (ref 3.5–5.1)
Sodium: 143 mmol/L (ref 135–145)
Total Bilirubin: 0.1 mg/dL — ABNORMAL LOW (ref 0.3–1.2)
Total Protein: 7.1 g/dL (ref 6.5–8.1)

## 2018-10-13 LAB — IRON AND TIBC
Iron: 38 ug/dL (ref 28–170)
Saturation Ratios: 16 % (ref 10.4–31.8)
TIBC: 242 ug/dL — ABNORMAL LOW (ref 250–450)
UIBC: 204 ug/dL

## 2018-10-13 LAB — FOLATE: Folate: 9.7 ng/mL (ref >5.9)

## 2018-10-13 LAB — CBC WITH DIFFERENTIAL/PLATELET
Abs Immature Granulocytes: 0.03 10*3/uL (ref 0.00–0.07)
Basophils Absolute: 0 10*3/uL (ref 0.0–0.1)
Basophils Relative: 0 %
Eosinophils Absolute: 0.3 10*3/uL (ref 0.0–0.5)
Eosinophils Relative: 3 %
HCT: 33 % — ABNORMAL LOW (ref 36.0–46.0)
Hemoglobin: 10.4 g/dL — ABNORMAL LOW (ref 12.0–15.0)
Immature Granulocytes: 0 %
Lymphocytes Relative: 19 %
Lymphs Abs: 1.6 10*3/uL (ref 0.7–4.0)
MCH: 31.7 pg (ref 26.0–34.0)
MCHC: 31.5 g/dL (ref 30.0–36.0)
MCV: 100.6 fL — ABNORMAL HIGH (ref 80.0–100.0)
Monocytes Absolute: 0.5 10*3/uL (ref 0.1–1.0)
Monocytes Relative: 6 %
Neutro Abs: 6.2 10*3/uL (ref 1.7–7.7)
Neutrophils Relative %: 72 %
Platelets: 229 10*3/uL (ref 150–400)
RBC: 3.28 MIL/uL — ABNORMAL LOW (ref 3.87–5.11)
RDW: 14.4 % (ref 11.5–15.5)
WBC: 8.6 10*3/uL (ref 4.0–10.5)
nRBC: 0 % (ref 0.0–0.2)

## 2018-10-13 LAB — VITAMIN B12: Vitamin B-12: 255 pg/mL (ref 180–914)

## 2018-10-13 LAB — FERRITIN: Ferritin: 227 ng/mL (ref 11–307)

## 2018-10-13 NOTE — Progress Notes (Signed)
Lab work reviewed. Aranesp held per parameters for hgb 10.4.

## 2018-10-13 NOTE — Assessment & Plan Note (Signed)
1.  Anemia due to CKD: - Patient on Aranesp started on 11/10/2015.  Currently on Aranesp 150 mcg every 28 days.  Last dose on 09/15/2018. - Last ferritin was 297 on 06/23/2018. -Last colonoscopy was reportedly done in [place], around 7 years ago. - As per the chart, she also has a history of AVMs and esophageal varices. -Today hemoglobin was 10.4. We will hold Aranesp today for hemoglobin greater than 10.   -She will continue Aranesp today and every 28 days for hemoglobin less than 10.0 g/dL as per Nephrology recommendations.  -She does have a right forearm fistula, but was not started on dialysis. - Ferritin, iron panel, Y11, folic acid and SPEP: pending - RTC in 4 weeks.

## 2018-10-13 NOTE — Progress Notes (Signed)
[place], [place] [address]   CLINIC:  Medical Oncology/Hematology  PCP:  [name], MD [address] [place] [address] [phone]   REASON FOR VISIT:  Follow-up for Anemia  CURRENT THERAPY: Monthly Aranesp      INTERVAL HISTORY:  Shannon [name] 63 y.o. female presents today for follow up of anemia. She reports overall doing well. She denies any significant fatigue. Denies any episodes of spontaneous bleeding. Denies any chest pain, SOB, lightheadedness or dizziness. Denies any abdominal pain or changes in bowel habits. She remains off of dialysis. She is here for repeat labs and office visit.   REVIEW OF SYSTEMS:  Review of Systems  Constitutional: Negative.   HENT:  Negative.   Eyes: Negative.   Respiratory: Negative.   Cardiovascular: Negative.   Gastrointestinal: Negative.   Endocrine: Negative.   Genitourinary: Negative.    Musculoskeletal: Negative.   Skin: Negative.   Neurological: Negative.   Hematological: Negative.      PAST MEDICAL/SURGICAL HISTORY:  Past Medical History:  Diagnosis Date  . Absolute anemia 11/10/2015  . Anemia   . Anemia of renal disease 11/10/2015  . Asthma    has Albuterol inhaler prn  . CHF (congestive heart failure) ([place])    acute systolic CHF 09/[facility])  . Chronic kidney disease    Membranous nephropathy ( Seen at [facility])  . Gastric ulcer   . History of blood transfusion    no abnormal reaction noted  . History of bronchitis    last time 3-28yrs ago  . Hyperlipidemia    takes Pravastatin daily  . Hypertension    takes Imdur and Hydralazine daily as well as Coreg  . Hypothyroidism    takes Synthroid daily  . Itching    on legs-has a Kenalog cream  . Metabolic bone disease   . Nausea    takes Zofran prn  . Nonischemic cardiomyopathy ([place])    12/2011 normal coronaries, EF 22% (EF normal 04/2013)  . Obesity   . Peripheral edema    takes Torsemide daily  . Thyroid disease     hypothyroidism   Past Surgical History:  Procedure Laterality Date  . AV FISTULA PLACEMENT Left 07/22/2012   Procedure: INSERTION OF ARTERIOVENOUS GORE-TEX GRAFT ARM;  Surgeon: Angelia Mould, MD;  Location: [place];  Service: Vascular;  Laterality: Left;  . AV FISTULA PLACEMENT Right 05/24/2017   Procedure: INSERTION OF ARTERIOVENOUS (AV) GORE-TEX GRAFT RIGHT ARM;  Surgeon: Angelia Mould, MD;  Location: [place];  Service: Vascular;  Laterality: Right;  . CESAREAN SECTION     30+yrs ago  . COLONOSCOPY    . left arm surgery with pin     15+yrs ago  . TUBAL LIGATION       SOCIAL HISTORY:  Social History   Socioeconomic History  . Marital status: Single    Spouse name: Not on file  . Number of children: Not on file  . Years of education: Not on file  . Highest education level: Not on file  Occupational History  . Not on file  Social Needs  . Financial resource strain: Not on file  . Food insecurity:    Worry: Not on file    Inability: Not on file  . Transportation needs:    Medical: Not on file    Non-medical: Not on file  Tobacco Use  . Smoking status: Current Every Day Smoker    Packs/day: 1.00  Years: 40.00    Pack years: 40.00    Types: Cigarettes    Start date: 05/14/1968  . Smokeless tobacco: Never Used  Substance and Sexual Activity  . Alcohol use: No  . Drug use: No  . Sexual activity: Not Currently  Lifestyle  . Physical activity:    Days per week: Not on file    Minutes per session: Not on file  . Stress: Not on file  Relationships  . Social connections:    Talks on phone: Not on file    Gets together: Not on file    Attends religious service: Not on file    Active member of club or organization: Not on file    Attends meetings of clubs or organizations: Not on file    Relationship status: Not on file  . Intimate partner violence:    Fear of current or ex partner: Not on file    Emotionally abused: Not on file    Physically abused:  Not on file    Forced sexual activity: Not on file  Other Topics Concern  . Not on file  Social History Narrative  . Not on file    FAMILY HISTORY:  Family History  Problem Relation Age of Onset  . Kidney disease Other   . Diabetes Mother     CURRENT MEDICATIONS:  Outpatient Encounter Medications as of 10/13/2018  Medication Sig  . acetaminophen (TYLENOL) 500 MG tablet Take 1,000 mg by mouth daily as needed for moderate pain or headache.  . albuterol (PROVENTIL HFA;VENTOLIN HFA) 108 (90 BASE) MCG/ACT inhaler Inhale 1-2 puffs into the lungs every 6 (six) hours as needed for wheezing.   . carvedilol (COREG) 25 MG tablet Take 25 mg by mouth 2 (two) times daily.   . clobetasol cream (TEMOVATE) 8.46 % Apply 1 application topically daily as needed (rash).   . hydrALAZINE (APRESOLINE) 50 MG tablet Take 50 mg by mouth every 8 (eight) hours.  . isosorbide mononitrate (IMDUR) 30 MG 24 hr tablet Take 30 mg by mouth daily.  . metolazone (ZAROXOLYN) 5 MG tablet Take 5 mg by mouth 2 (two) times daily as needed (swelling).   . pravastatin (PRAVACHOL) 80 MG tablet Take 80 mg by mouth daily.  . raloxifene (EVISTA) 60 MG tablet Take 60 mg by mouth daily.  Marland Kitchen RAYALDEE 30 MCG CPCR Take 30 mcg by mouth at bedtime.   . sodium bicarbonate 650 MG tablet Take 1,300 mg by mouth 3 (three) times daily.   Marland Kitchen torsemide (DEMADEX) 20 MG tablet Take 20 mg by mouth daily as needed (swelling).    No facility-administered encounter medications on file as of 10/13/2018.     ALLERGIES:  Allergies  Allergen Reactions  . Corticosteroids Hives, Itching, Nausea And Vomiting and Rash    Tolerated dexamethasone when being treated for a reaction to rituximab  . Rituximab Shortness Of Breath    wheezing wheezing      PHYSICAL EXAM:  ECOG Performance status: 1  Vitals:   10/13/18 0847  BP: (!) 152/68  Pulse: 60  Resp: 18  Temp: 98.4 F (36.9 C)  SpO2: 100%   Filed Weights   10/13/18 0847  Weight: 229 lb  (103.9 kg)    Physical Exam Constitutional:      Appearance: Normal appearance. She is obese.  HENT:     Head: Normocephalic.     Nose: Nose normal.     Mouth/Throat:     Mouth: Mucous membranes are moist.  Pharynx: Oropharynx is clear.  Eyes:     Extraocular Movements: Extraocular movements intact.     Conjunctiva/sclera: Conjunctivae normal.  Neck:     Musculoskeletal: Normal range of motion.  Cardiovascular:     Rate and Rhythm: Normal rate and regular rhythm.     Pulses: Normal pulses.     Heart sounds: Normal heart sounds.  Pulmonary:     Effort: Pulmonary effort is normal.     Breath sounds: Normal breath sounds.  Abdominal:     General: Bowel sounds are normal.     Palpations: Abdomen is soft.  Musculoskeletal: Normal range of motion.  Skin:    General: Skin is warm.  Neurological:     General: No focal deficit present.     Mental Status: She is alert and oriented to person, place, and time.  Psychiatric:        Mood and Affect: Mood normal.        Behavior: Behavior normal.        Thought Content: Thought content normal.        Judgment: Judgment normal.      LABORATORY DATA:  I have reviewed the labs as listed.  CBC    Component Value Date/Time   WBC 8.6 10/13/2018 0830   RBC 3.28 (L) 10/13/2018 0830   HGB 10.4 (L) 10/13/2018 0830   HCT 33.0 (L) 10/13/2018 0830   PLT 229 10/13/2018 0830   MCV 100.6 (H) 10/13/2018 0830   MCH 31.7 10/13/2018 0830   MCHC 31.5 10/13/2018 0830   RDW 14.4 10/13/2018 0830   LYMPHSABS 1.6 10/13/2018 0830   MONOABS 0.5 10/13/2018 0830   EOSABS 0.3 10/13/2018 0830   BASOSABS 0.0 10/13/2018 0830   CMP Latest Ref Rng & Units 10/13/2018 06/23/2018 05/26/2018  Glucose 70 - 99 mg/dL 100(H) 99 101(H)  BUN 8 - 23 mg/dL 42(H) 48(H) 37(H)  Creatinine 0.44 - 1.00 mg/dL 3.99(H) 4.32(H) 3.64(H)  Sodium 135 - 145 mmol/L 143 141 141  Potassium 3.5 - 5.1 mmol/L 4.5 4.3 4.0  Chloride 98 - 111 mmol/L 110 111 113(H)  CO2 22 - 32 mmol/L  22 23 21(L)  Calcium 8.9 - 10.3 mg/dL 9.0 8.6(L) 8.9  Total Protein 6.5 - 8.1 g/dL 7.1 6.6 6.5  Total Bilirubin 0.3 - 1.2 mg/dL 0.1(L) 0.4 0.7  Alkaline Phos 38 - 126 U/L 81 63 60  AST 15 - 41 U/L 16 14(L) 15  ALT 0 - 44 U/L 11 17 12        ASSESSMENT & PLAN:   Anemia of renal disease 1.  Anemia due to CKD: - Patient on Aranesp started on 11/10/2015.  Currently on Aranesp 150 mcg every 28 days.  Last dose on 09/15/2018. - Last ferritin was 297 on 06/23/2018. -Last colonoscopy was reportedly done in [place], around 7 years ago. - As per the chart, she also has a history of AVMs and esophageal varices. -Today hemoglobin was 10.4. We will hold Aranesp today for hemoglobin greater than 10.   -She will continue Aranesp today and every 28 days for hemoglobin less than 10.0 g/dL as per Nephrology recommendations.  -She does have a right forearm fistula, but was not started on dialysis. - Ferritin, iron panel, S85, folic acid and SPEP: pending - RTC in 4 weeks.       Orders placed this encounter:  Orders Placed This Encounter  Procedures  . CBC with Differential  . Comprehensive metabolic panel     Roger Shelter,  FNP

## 2018-10-14 LAB — PROTEIN ELECTROPHORESIS, SERUM
A/G Ratio: 1.1 (ref 0.7–1.7)
Albumin ELP: 3.3 g/dL (ref 2.9–4.4)
Alpha-1-Globulin: 0.3 g/dL (ref 0.0–0.4)
Alpha-2-Globulin: 0.8 g/dL (ref 0.4–1.0)
Beta Globulin: 1 g/dL (ref 0.7–1.3)
Gamma Globulin: 1 g/dL (ref 0.4–1.8)
Globulin, Total: 3.1 g/dL (ref 2.2–3.9)
Total Protein ELP: 6.4 g/dL (ref 6.0–8.5)

## 2018-10-14 LAB — SOLUBLE TRANSFERRIN RECEPTOR: Transferrin Receptor: 21.4 nmol/L (ref 12.2–27.3)

## 2018-11-07 ENCOUNTER — Other Ambulatory Visit (HOSPITAL_COMMUNITY): Payer: Self-pay | Admitting: *Deleted

## 2018-11-07 ENCOUNTER — Other Ambulatory Visit: Payer: Self-pay

## 2018-11-07 DIAGNOSIS — N189 Chronic kidney disease, unspecified: Secondary | ICD-10-CM

## 2018-11-07 DIAGNOSIS — N184 Chronic kidney disease, stage 4 (severe): Secondary | ICD-10-CM

## 2018-11-07 DIAGNOSIS — D508 Other iron deficiency anemias: Secondary | ICD-10-CM

## 2018-11-07 DIAGNOSIS — D631 Anemia in chronic kidney disease: Secondary | ICD-10-CM

## 2018-11-10 ENCOUNTER — Other Ambulatory Visit: Payer: Self-pay

## 2018-11-10 ENCOUNTER — Inpatient Hospital Stay (HOSPITAL_COMMUNITY): Payer: Medicare Other

## 2018-11-10 ENCOUNTER — Inpatient Hospital Stay (HOSPITAL_BASED_OUTPATIENT_CLINIC_OR_DEPARTMENT_OTHER): Payer: Medicare Other | Admitting: Nurse Practitioner

## 2018-11-10 DIAGNOSIS — N189 Chronic kidney disease, unspecified: Secondary | ICD-10-CM | POA: Diagnosis not present

## 2018-11-10 DIAGNOSIS — N184 Chronic kidney disease, stage 4 (severe): Secondary | ICD-10-CM

## 2018-11-10 DIAGNOSIS — D631 Anemia in chronic kidney disease: Secondary | ICD-10-CM

## 2018-11-10 DIAGNOSIS — D508 Other iron deficiency anemias: Secondary | ICD-10-CM

## 2018-11-10 LAB — VITAMIN B12: Vitamin B-12: 206 pg/mL (ref 180–914)

## 2018-11-10 LAB — COMPREHENSIVE METABOLIC PANEL
ALT: 12 U/L (ref 0–44)
AST: 15 U/L (ref 15–41)
Albumin: 3.4 g/dL — ABNORMAL LOW (ref 3.5–5.0)
Alkaline Phosphatase: 84 U/L (ref 38–126)
Anion gap: 12 (ref 5–15)
BUN: 40 mg/dL — ABNORMAL HIGH (ref 8–23)
CO2: 21 mmol/L — ABNORMAL LOW (ref 22–32)
Calcium: 8.9 mg/dL (ref 8.9–10.3)
Chloride: 110 mmol/L (ref 98–111)
Creatinine, Ser: 3.86 mg/dL — ABNORMAL HIGH (ref 0.44–1.00)
GFR calc Af Amer: 14 mL/min — ABNORMAL LOW (ref >60)
GFR calc non Af Amer: 12 mL/min — ABNORMAL LOW (ref >60)
Glucose, Bld: 104 mg/dL — ABNORMAL HIGH (ref 70–99)
Potassium: 4.1 mmol/L (ref 3.5–5.1)
Sodium: 143 mmol/L (ref 135–145)
Total Bilirubin: 0.4 mg/dL (ref 0.3–1.2)
Total Protein: 7 g/dL (ref 6.5–8.1)

## 2018-11-10 LAB — CBC
HCT: 35.1 % — ABNORMAL LOW (ref 36.0–46.0)
Hemoglobin: 10.7 g/dL — ABNORMAL LOW (ref 12.0–15.0)
MCH: 30.5 pg (ref 26.0–34.0)
MCHC: 30.5 g/dL (ref 30.0–36.0)
MCV: 100 fL (ref 80.0–100.0)
Platelets: 200 10*3/uL (ref 150–400)
RBC: 3.51 MIL/uL — ABNORMAL LOW (ref 3.87–5.11)
RDW: 14 % (ref 11.5–15.5)
WBC: 7.4 10*3/uL (ref 4.0–10.5)
nRBC: 0 % (ref 0.0–0.2)

## 2018-11-10 LAB — IRON AND TIBC
Iron: 70 ug/dL (ref 28–170)
Saturation Ratios: 30 % (ref 10.4–31.8)
TIBC: 230 ug/dL — ABNORMAL LOW (ref 250–450)
UIBC: 160 ug/dL

## 2018-11-10 LAB — FERRITIN: Ferritin: 249 ng/mL (ref 11–307)

## 2018-11-10 LAB — FOLATE: Folate: 8.4 ng/mL (ref >5.9)

## 2018-11-10 MED ORDER — CYANOCOBALAMIN 1000 MCG/ML IJ SOLN
INTRAMUSCULAR | Status: AC
  Filled 2018-11-10: qty 1

## 2018-11-10 NOTE — Progress Notes (Signed)
Aranesp held today for hgb 10.7.

## 2018-11-10 NOTE — Patient Instructions (Signed)
[place] at [facility] Discharge Instructions  Follow-up in 3 months with labs. Please start taking vitamin B12 oral daily.   Thank you for choosing [place] at [facility] to provide your oncology and hematology care.  To afford each patient quality time with our provider, please arrive at least 15 minutes before your scheduled appointment time.   If you have a lab appointment with the [facility] please come in thru the  Main Entrance and check in at the main information desk  You need to re-schedule your appointment should you arrive 10 or more minutes late.  We strive to give you quality time with our providers, and arriving late affects you and other patients whose appointments are after yours.  Also, if you no show three or more times for appointments you may be dismissed from the clinic at the providers discretion.     Again, thank you for choosing [facility].  Our hope is that these requests will decrease the amount of time that you wait before being seen by our physicians.       _____________________________________________________________  Should you have questions after your visit to [facility], please contact our office at [phone] between the hours of 8:00 a.m. and 4:30 p.m.  Voicemails left after 4:00 p.m. will not be returned until the following business day.  For prescription refill requests, have your pharmacy contact our office and allow 72 hours.    Cancer Center Support Programs:   > Cancer Support Group  2nd Tuesday of the month 1pm-2pm, Journey Room

## 2018-11-10 NOTE — Assessment & Plan Note (Addendum)
1.  Anemia: - This is due to CKD. - Patient is on Aranesp which was started 11/10/2015.  Currently on Aranesp 150 mcg every 28 days.  Last dose was on flow 09/15/2018 - Last colonoscopy was reportedly done in [place], around 7 years ago. - It is recorded that Shannon [name] has a history of AVMs and esophageal varices. -Labs on 11/10/2018 showed her hemoglobin 10.7, ferritin 249, percent saturation 30, creatinine 3.86, and WBC 7.4 -Shannon [name] does have a right forearm fistula has not started dialysis yet. - Shannon [name] will continue Aranesp today and every 28 days for hemoglobin less than 10.0 g/dL as per nephrology recommendations. -We started her on vitamin B12 oral today due to her B12 being borderline at 206.  We will recheck next visit. -Patient had a negative SPEP. -Shannon [name] does not need her Aranesp today due to her hemoglobin being 10.7. - Follow-up in 3 months with repeat labs.

## 2018-11-10 NOTE — Progress Notes (Signed)
[place], [place] [address]   CLINIC:  Medical Oncology/Hematology  PCP:  [name], MD [place] [address] [phone]   REASON FOR VISIT: Follow-up for anemia due to CKD  CURRENT THERAPY: Aranesp 150 mcg every month   INTERVAL HISTORY:  Shannon [name] 63 y.o. female returns for routine follow-up for anemia due to CKD.  She reports she has been doing well since her last visit she has no complaints at this time.  She denies any bright red bleeding per rectum or melena. Denies any nausea, vomiting, or diarrhea. Denies any new pains. Had not noticed any recent bleeding such as epistaxis, hematuria or hematochezia. Denies recent chest pain on exertion, shortness of breath on minimal exertion, pre-syncopal episodes, or palpitations. Denies any numbness or tingling in hands or feet. Denies any recent fevers, infections, or recent hospitalizations. Patient reports appetite at 100 % and energy level at 100 %.  She is eating well maintaining her weight at this time.     REVIEW OF SYSTEMS:  Review of Systems  All other systems reviewed and are negative.    PAST MEDICAL/SURGICAL HISTORY:  Past Medical History:  Diagnosis Date  . Absolute anemia 11/10/2015  . Anemia   . Anemia of renal disease 11/10/2015  . Asthma    has Albuterol inhaler prn  . CHF (congestive heart failure) ([place])    acute systolic CHF 06/[facility])  . Chronic kidney disease    Membranous nephropathy ( Seen at [facility])  . Gastric ulcer   . History of blood transfusion    no abnormal reaction noted  . History of bronchitis    last time 3-8yrs ago  . Hyperlipidemia    takes Pravastatin daily  . Hypertension    takes Imdur and Hydralazine daily as well as Coreg  . Hypothyroidism    takes Synthroid daily  . Itching    on legs-has a Kenalog cream  . Metabolic bone disease   . Nausea    takes Zofran prn  . Nonischemic cardiomyopathy ([place])    12/2011 normal  coronaries, EF 22% (EF normal 04/2013)  . Obesity   . Peripheral edema    takes Torsemide daily  . Thyroid disease    hypothyroidism   Past Surgical History:  Procedure Laterality Date  . AV FISTULA PLACEMENT Left 07/22/2012   Procedure: INSERTION OF ARTERIOVENOUS GORE-TEX GRAFT ARM;  Surgeon: Angelia Mould, MD;  Location: [place];  Service: Vascular;  Laterality: Left;  . AV FISTULA PLACEMENT Right 05/24/2017   Procedure: INSERTION OF ARTERIOVENOUS (AV) GORE-TEX GRAFT RIGHT ARM;  Surgeon: Angelia Mould, MD;  Location: [place];  Service: Vascular;  Laterality: Right;  . CESAREAN SECTION     30+yrs ago  . COLONOSCOPY    . left arm surgery with pin     15+yrs ago  . TUBAL LIGATION       SOCIAL HISTORY:  Social History   Socioeconomic History  . Marital status: Single    Spouse name: Not on file  . Number of children: Not on file  . Years of education: Not on file  . Highest education level: Not on file  Occupational History  . Not on file  Social Needs  . Financial resource strain: Not on file  . Food insecurity    Worry: Not on file    Inability: Not on file  . Transportation needs    Medical: Not on  file    Non-medical: Not on file  Tobacco Use  . Smoking status: Current Every Day Smoker    Packs/day: 1.00    Years: 40.00    Pack years: 40.00    Types: Cigarettes    Start date: 05/14/1968  . Smokeless tobacco: Never Used  Substance and Sexual Activity  . Alcohol use: No  . Drug use: No  . Sexual activity: Not Currently  Lifestyle  . Physical activity    Days per week: Not on file    Minutes per session: Not on file  . Stress: Not on file  Relationships  . Social Herbalist on phone: Not on file    Gets together: Not on file    Attends religious service: Not on file    Active member of club or organization: Not on file    Attends meetings of clubs or organizations: Not on file    Relationship status: Not on file  . Intimate partner  violence    Fear of current or ex partner: Not on file    Emotionally abused: Not on file    Physically abused: Not on file    Forced sexual activity: Not on file  Other Topics Concern  . Not on file  Social History Narrative  . Not on file    FAMILY HISTORY:  Family History  Problem Relation Age of Onset  . Kidney disease Other   . Diabetes Mother     CURRENT MEDICATIONS:  Outpatient Encounter Medications as of 11/10/2018  Medication Sig  . acetaminophen (TYLENOL) 500 MG tablet Take 1,000 mg by mouth daily as needed for moderate pain or headache.  . albuterol (PROVENTIL HFA;VENTOLIN HFA) 108 (90 BASE) MCG/ACT inhaler Inhale 1-2 puffs into the lungs every 6 (six) hours as needed for wheezing.   . carvedilol (COREG) 25 MG tablet Take 25 mg by mouth 2 (two) times daily.   . clobetasol cream (TEMOVATE) 6.06 % Apply 1 application topically daily as needed (rash).   . hydrALAZINE (APRESOLINE) 50 MG tablet Take 50 mg by mouth every 8 (eight) hours.  . isosorbide mononitrate (IMDUR) 30 MG 24 hr tablet Take 30 mg by mouth daily.  . metolazone (ZAROXOLYN) 5 MG tablet Take 5 mg by mouth 2 (two) times daily as needed (swelling).   . pravastatin (PRAVACHOL) 80 MG tablet Take 80 mg by mouth daily.  . raloxifene (EVISTA) 60 MG tablet Take 60 mg by mouth daily.  Marland Kitchen RAYALDEE 30 MCG CPCR Take 30 mcg by mouth at bedtime.   . sodium bicarbonate 650 MG tablet Take 1,300 mg by mouth 3 (three) times daily.   Marland Kitchen torsemide (DEMADEX) 20 MG tablet Take 20 mg by mouth daily as needed (swelling).    No facility-administered encounter medications on file as of 11/10/2018.     ALLERGIES:  Allergies  Allergen Reactions  . Corticosteroids Hives, Itching, Nausea And Vomiting and Rash    Tolerated dexamethasone when being treated for a reaction to rituximab  . Rituximab Shortness Of Breath    wheezing wheezing      PHYSICAL EXAM:  ECOG Performance status: 1  Vitals:   11/10/18 0845  BP: 111/61   Pulse: 65  Resp: 14  Temp: (!) 96.9 F (36.1 C)  SpO2: 99%   Filed Weights   11/10/18 0845  Weight: 232 lb 6.4 oz (105.4 kg)    Physical Exam Constitutional:      Appearance: Normal appearance. She is normal weight.  Cardiovascular:     Rate and Rhythm: Normal rate and regular rhythm.     Heart sounds: Normal heart sounds.  Pulmonary:     Effort: Pulmonary effort is normal.     Breath sounds: Normal breath sounds.  Abdominal:     General: Bowel sounds are normal.     Palpations: Abdomen is soft.  Musculoskeletal: Normal range of motion.  Skin:    General: Skin is warm and dry.  Neurological:     Mental Status: She is alert and oriented to person, place, and time. Mental status is at baseline.  Psychiatric:        Mood and Affect: Mood normal.        Behavior: Behavior normal.        Thought Content: Thought content normal.        Judgment: Judgment normal.      LABORATORY DATA:  I have reviewed the labs as listed.  CBC    Component Value Date/Time   WBC 7.4 11/10/2018 0826   RBC 3.51 (L) 11/10/2018 0826   HGB 10.7 (L) 11/10/2018 0826   HCT 35.1 (L) 11/10/2018 0826   PLT 200 11/10/2018 0826   MCV 100.0 11/10/2018 0826   MCH 30.5 11/10/2018 0826   MCHC 30.5 11/10/2018 0826   RDW 14.0 11/10/2018 0826   LYMPHSABS 1.6 10/13/2018 0830   MONOABS 0.5 10/13/2018 0830   EOSABS 0.3 10/13/2018 0830   BASOSABS 0.0 10/13/2018 0830   CMP Latest Ref Rng & Units 11/10/2018 10/13/2018 06/23/2018  Glucose 70 - 99 mg/dL 104(H) 100(H) 99  BUN 8 - 23 mg/dL 40(H) 42(H) 48(H)  Creatinine 0.44 - 1.00 mg/dL 3.86(H) 3.99(H) 4.32(H)  Sodium 135 - 145 mmol/L 143 143 141  Potassium 3.5 - 5.1 mmol/L 4.1 4.5 4.3  Chloride 98 - 111 mmol/L 110 110 111  CO2 22 - 32 mmol/L 21(L) 22 23  Calcium 8.9 - 10.3 mg/dL 8.9 9.0 8.6(L)  Total Protein 6.5 - 8.1 g/dL 7.0 7.1 6.6  Total Bilirubin 0.3 - 1.2 mg/dL 0.4 0.1(L) 0.4  Alkaline Phos 38 - 126 U/L 84 81 63  AST 15 - 41 U/L 15 16 14(L)  ALT 0 -  44 U/L 12 11 17      I personally performed a face-to-face visit.  All questions were answered to patient's stated satisfaction. Encouraged patient to call with any new concerns or questions before his next visit to the cancer center and we can certain see him sooner, if needed.     ASSESSMENT & PLAN:   Anemia of renal disease 1.  Anemia: - This is due to CKD. - Patient is on Aranesp which was started 11/10/2015.  Currently on Aranesp 150 mcg every 28 days.  Last dose was on flow 09/15/2018 - Last colonoscopy was reportedly done in [place], around 7 years ago. - It is recorded that she has a history of AVMs and esophageal varices. -Labs on 11/10/2018 showed her hemoglobin 10.7, ferritin 249, percent saturation 30, creatinine 3.86, and WBC 7.4 -She does have a right forearm fistula has not started dialysis yet. - She will continue Aranesp today and every 28 days for hemoglobin less than 10.0 g/dL as per nephrology recommendations. -We started her on vitamin B12 oral today due to her B12 being borderline at 206.  We will recheck next visit. -Patient had a negative SPEP. -She does not need her Aranesp today due to her hemoglobin being 10.7. - Follow-up in 3 months with repeat labs.  Orders placed this encounter:  Orders Placed This Encounter  Procedures  . CBC  . Lactate dehydrogenase  . CBC with Differential/Platelet  . Comprehensive metabolic panel  . Ferritin  . Iron and TIBC  . Vitamin B12  . VITAMIN D 25 Hydroxy (Vit-D Deficiency, Fractures)  . Folate      Francene Finders, FNP-C [place] [phone]

## 2018-12-08 ENCOUNTER — Inpatient Hospital Stay (HOSPITAL_COMMUNITY): Payer: Medicare Other

## 2018-12-08 ENCOUNTER — Other Ambulatory Visit: Payer: Self-pay

## 2018-12-08 ENCOUNTER — Inpatient Hospital Stay (HOSPITAL_COMMUNITY): Payer: Medicare Other | Attending: Hematology

## 2018-12-08 DIAGNOSIS — D631 Anemia in chronic kidney disease: Secondary | ICD-10-CM | POA: Insufficient documentation

## 2018-12-08 DIAGNOSIS — N189 Chronic kidney disease, unspecified: Secondary | ICD-10-CM

## 2018-12-08 DIAGNOSIS — N184 Chronic kidney disease, stage 4 (severe): Secondary | ICD-10-CM

## 2018-12-08 LAB — CBC
HCT: 32.4 % — ABNORMAL LOW (ref 36.0–46.0)
Hemoglobin: 10 g/dL — ABNORMAL LOW (ref 12.0–15.0)
MCH: 31.3 pg (ref 26.0–34.0)
MCHC: 30.9 g/dL (ref 30.0–36.0)
MCV: 101.3 fL — ABNORMAL HIGH (ref 80.0–100.0)
Platelets: 204 10*3/uL (ref 150–400)
RBC: 3.2 MIL/uL — ABNORMAL LOW (ref 3.87–5.11)
RDW: 14.3 % (ref 11.5–15.5)
WBC: 6.5 10*3/uL (ref 4.0–10.5)
nRBC: 0 % (ref 0.0–0.2)

## 2018-12-08 NOTE — Progress Notes (Signed)
Shannon [name] presents today for Aranesp injection. Injection held per parameters. Pt made aware of lab results. Discharged self ambulatory in satisfactory condition.

## 2019-01-05 ENCOUNTER — Other Ambulatory Visit: Payer: Self-pay

## 2019-01-05 ENCOUNTER — Inpatient Hospital Stay (HOSPITAL_COMMUNITY): Payer: Medicare Other

## 2019-01-05 ENCOUNTER — Inpatient Hospital Stay (HOSPITAL_COMMUNITY): Payer: Medicare Other | Attending: Hematology

## 2019-01-05 DIAGNOSIS — D631 Anemia in chronic kidney disease: Secondary | ICD-10-CM

## 2019-01-05 DIAGNOSIS — N184 Chronic kidney disease, stage 4 (severe): Secondary | ICD-10-CM | POA: Insufficient documentation

## 2019-01-05 DIAGNOSIS — N189 Chronic kidney disease, unspecified: Secondary | ICD-10-CM

## 2019-01-05 LAB — CBC
HCT: 34.7 % — ABNORMAL LOW (ref 36.0–46.0)
Hemoglobin: 10.8 g/dL — ABNORMAL LOW (ref 12.0–15.0)
MCH: 31.2 pg (ref 26.0–34.0)
MCHC: 31.1 g/dL (ref 30.0–36.0)
MCV: 100.3 fL — ABNORMAL HIGH (ref 80.0–100.0)
Platelets: 210 10*3/uL (ref 150–400)
RBC: 3.46 MIL/uL — ABNORMAL LOW (ref 3.87–5.11)
RDW: 13.8 % (ref 11.5–15.5)
WBC: 8.4 10*3/uL (ref 4.0–10.5)
nRBC: 0 % (ref 0.0–0.2)

## 2019-01-05 NOTE — Progress Notes (Signed)
Shannon [name] presents today for Aranesp injection. Hgb 10.8. Per parameters, Aranesp injection held. Pt made aware and copy of lab work given to patient. Discharged in satisfactory condition with follow up instructions.

## 2019-02-02 ENCOUNTER — Inpatient Hospital Stay (HOSPITAL_COMMUNITY): Payer: Medicare Other | Attending: Hematology

## 2019-02-02 ENCOUNTER — Other Ambulatory Visit: Payer: Self-pay

## 2019-02-02 ENCOUNTER — Inpatient Hospital Stay (HOSPITAL_BASED_OUTPATIENT_CLINIC_OR_DEPARTMENT_OTHER): Payer: Medicare Other | Admitting: Nurse Practitioner

## 2019-02-02 ENCOUNTER — Inpatient Hospital Stay (HOSPITAL_COMMUNITY): Payer: Medicare Other

## 2019-02-02 DIAGNOSIS — E538 Deficiency of other specified B group vitamins: Secondary | ICD-10-CM | POA: Diagnosis not present

## 2019-02-02 DIAGNOSIS — J45909 Unspecified asthma, uncomplicated: Secondary | ICD-10-CM | POA: Diagnosis not present

## 2019-02-02 DIAGNOSIS — E785 Hyperlipidemia, unspecified: Secondary | ICD-10-CM | POA: Diagnosis not present

## 2019-02-02 DIAGNOSIS — E669 Obesity, unspecified: Secondary | ICD-10-CM | POA: Diagnosis not present

## 2019-02-02 DIAGNOSIS — F17210 Nicotine dependence, cigarettes, uncomplicated: Secondary | ICD-10-CM | POA: Diagnosis not present

## 2019-02-02 DIAGNOSIS — N189 Chronic kidney disease, unspecified: Secondary | ICD-10-CM

## 2019-02-02 DIAGNOSIS — E039 Hypothyroidism, unspecified: Secondary | ICD-10-CM | POA: Insufficient documentation

## 2019-02-02 DIAGNOSIS — D631 Anemia in chronic kidney disease: Secondary | ICD-10-CM | POA: Diagnosis present

## 2019-02-02 DIAGNOSIS — I10 Essential (primary) hypertension: Secondary | ICD-10-CM | POA: Diagnosis not present

## 2019-02-02 DIAGNOSIS — Z79899 Other long term (current) drug therapy: Secondary | ICD-10-CM | POA: Insufficient documentation

## 2019-02-02 LAB — CBC WITH DIFFERENTIAL/PLATELET
Abs Immature Granulocytes: 0.01 10*3/uL (ref 0.00–0.07)
Basophils Absolute: 0 10*3/uL (ref 0.0–0.1)
Basophils Relative: 0 %
Eosinophils Absolute: 0.2 10*3/uL (ref 0.0–0.5)
Eosinophils Relative: 3 %
HCT: 34 % — ABNORMAL LOW (ref 36.0–46.0)
Hemoglobin: 10.3 g/dL — ABNORMAL LOW (ref 12.0–15.0)
Immature Granulocytes: 0 %
Lymphocytes Relative: 18 %
Lymphs Abs: 1.3 10*3/uL (ref 0.7–4.0)
MCH: 31 pg (ref 26.0–34.0)
MCHC: 30.3 g/dL (ref 30.0–36.0)
MCV: 102.4 fL — ABNORMAL HIGH (ref 80.0–100.0)
Monocytes Absolute: 0.4 10*3/uL (ref 0.1–1.0)
Monocytes Relative: 5 %
Neutro Abs: 5.3 10*3/uL (ref 1.7–7.7)
Neutrophils Relative %: 74 %
Platelets: 215 10*3/uL (ref 150–400)
RBC: 3.32 MIL/uL — ABNORMAL LOW (ref 3.87–5.11)
RDW: 13.4 % (ref 11.5–15.5)
WBC: 7.2 10*3/uL (ref 4.0–10.5)
nRBC: 0 % (ref 0.0–0.2)

## 2019-02-02 LAB — IRON AND TIBC
Iron: 50 ug/dL (ref 28–170)
Saturation Ratios: 22 % (ref 10.4–31.8)
TIBC: 228 ug/dL — ABNORMAL LOW (ref 250–450)
UIBC: 178 ug/dL

## 2019-02-02 LAB — COMPREHENSIVE METABOLIC PANEL
ALT: 14 U/L (ref 0–44)
AST: 16 U/L (ref 15–41)
Albumin: 3.4 g/dL — ABNORMAL LOW (ref 3.5–5.0)
Alkaline Phosphatase: 72 U/L (ref 38–126)
Anion gap: 10 (ref 5–15)
BUN: 38 mg/dL — ABNORMAL HIGH (ref 8–23)
CO2: 21 mmol/L — ABNORMAL LOW (ref 22–32)
Calcium: 8.6 mg/dL — ABNORMAL LOW (ref 8.9–10.3)
Chloride: 110 mmol/L (ref 98–111)
Creatinine, Ser: 3.63 mg/dL — ABNORMAL HIGH (ref 0.44–1.00)
GFR calc Af Amer: 15 mL/min — ABNORMAL LOW (ref >60)
GFR calc non Af Amer: 13 mL/min — ABNORMAL LOW (ref >60)
Glucose, Bld: 97 mg/dL (ref 70–99)
Potassium: 4 mmol/L (ref 3.5–5.1)
Sodium: 141 mmol/L (ref 135–145)
Total Bilirubin: 0.4 mg/dL (ref 0.3–1.2)
Total Protein: 6.8 g/dL (ref 6.5–8.1)

## 2019-02-02 LAB — FOLATE: Folate: 10.7 ng/mL (ref >5.9)

## 2019-02-02 LAB — VITAMIN B12: Vitamin B-12: 305 pg/mL (ref 180–914)

## 2019-02-02 LAB — FERRITIN: Ferritin: 253 ng/mL (ref 11–307)

## 2019-02-02 LAB — LACTATE DEHYDROGENASE: LDH: 138 U/L (ref 98–192)

## 2019-02-02 NOTE — Patient Instructions (Signed)
[place] at [facility] Discharge Instructions  In 2 months come in get labs drawn with possible injection Follow up in 4 months with labs    Thank you for choosing [place] at [facility] to provide your oncology and hematology care.  To afford each patient quality time with our provider, please arrive at least 15 minutes before your scheduled appointment time.   If you have a lab appointment with the [facility] please come in thru the Main Entrance and check in at the main information desk.  You need to re-schedule your appointment should you arrive 10 or more minutes late.  We strive to give you quality time with our providers, and arriving late affects you and other patients whose appointments are after yours.  Also, if you no show three or more times for appointments you may be dismissed from the clinic at the providers discretion.     Again, thank you for choosing [facility].  Our hope is that these requests will decrease the amount of time that you wait before being seen by our physicians.       _____________________________________________________________  Should you have questions after your visit to [facility], please contact our office at [phone] between the hours of 8:00 a.m. and 4:30 p.m.  Voicemails left after 4:00 p.m. will not be returned until the following business day.  For prescription refill requests, have your pharmacy contact our office and allow 72 hours.    Due to Covid, you will need to wear a mask upon entering the hospital. If you do not have a mask, a mask will be given to you at the Main Entrance upon arrival. For doctor visits, patients may have 1 support person with them. For treatment visits, patients can not have anyone with them due to social distancing guidelines and our immunocompromised population.

## 2019-02-02 NOTE — Progress Notes (Signed)
Shannon [name] presents today for Aranesp injection. Per parameters, injection held for hgb 10.3.

## 2019-02-02 NOTE — Progress Notes (Signed)
Shannon [name], Shannon [name] [id number]   CLINIC:  Medical Oncology/Hematology  PCP:  [name], MD [address] [place] [address] [phone]   REASON FOR VISIT: Follow-up for anemia  CURRENT THERAPY: Aranesp injections   INTERVAL HISTORY:  Shannon [name] 63 y.o. female returns for routine follow-up for anemia.  Patient reports she has been feeling great her energy levels are good and she has no complaints at this time.  She denies any bright red bleeding per rectum or melena. Denies any nausea, vomiting, or diarrhea. Denies any new pains. Had not noticed any recent bleeding such as epistaxis, hematuria or hematochezia. Denies recent chest pain on exertion, shortness of breath on minimal exertion, pre-syncopal episodes, or palpitations. Denies any numbness or tingling in hands or feet. Denies any recent fevers, infections, or recent hospitalizations. Patient reports appetite at 100% and energy level at 100%.  She is eating well maintaining her weight at this time.    REVIEW OF SYSTEMS:  Review of Systems  All other systems reviewed and are negative.    PAST MEDICAL/SURGICAL HISTORY:  Past Medical History:  Diagnosis Date  . Absolute anemia 11/10/2015  . Anemia   . Anemia of renal disease 11/10/2015  . Asthma    has Albuterol inhaler prn  . CHF (congestive heart failure) ([place])    acute systolic CHF 10/[facility])  . Chronic kidney disease    Membranous nephropathy ( Seen at [facility])  . Gastric ulcer   . History of blood transfusion    no abnormal reaction noted  . History of bronchitis    last time 3-62yrs ago  . Hyperlipidemia    takes Pravastatin daily  . Hypertension    takes Imdur and Hydralazine daily as well as Coreg  . Hypothyroidism    takes Synthroid daily  . Itching    on legs-has a Kenalog cream  . Metabolic bone disease   . Nausea    takes Zofran prn  . Nonischemic cardiomyopathy ([place])    12/2011 normal coronaries, EF 22%  (EF normal 04/2013)  . Obesity   . Peripheral edema    takes Torsemide daily  . Thyroid disease    hypothyroidism   Past Surgical History:  Procedure Laterality Date  . AV FISTULA PLACEMENT Left 07/22/2012   Procedure: INSERTION OF ARTERIOVENOUS GORE-TEX GRAFT ARM;  Surgeon: Angelia Mould, MD;  Location: [place];  Service: Vascular;  Laterality: Left;  . AV FISTULA PLACEMENT Right 05/24/2017   Procedure: INSERTION OF ARTERIOVENOUS (AV) GORE-TEX GRAFT RIGHT ARM;  Surgeon: Angelia Mould, MD;  Location: [place];  Service: Vascular;  Laterality: Right;  . CESAREAN SECTION     30+yrs ago  . COLONOSCOPY    . left arm surgery with pin     15+yrs ago  . TUBAL LIGATION       SOCIAL HISTORY:  Social History   Socioeconomic History  . Marital status: Single    Spouse name: Not on file  . Number of children: Not on file  . Years of education: Not on file  . Highest education level: Not on file  Occupational History  . Not on file  Social Needs  . Financial resource strain: Not on file  . Food insecurity    Worry: Not on file    Inability: Not on file  . Transportation needs    Medical: Not on file    Non-medical: Not on file  Tobacco  Use  . Smoking status: Current Every Day Smoker    Packs/day: 1.00    Years: 40.00    Pack years: 40.00    Types: Cigarettes    Start date: 05/14/1968  . Smokeless tobacco: Never Used  Substance and Sexual Activity  . Alcohol use: No  . Drug use: No  . Sexual activity: Not Currently  Lifestyle  . Physical activity    Days per week: Not on file    Minutes per session: Not on file  . Stress: Not on file  Relationships  . Social Herbalist on phone: Not on file    Gets together: Not on file    Attends religious service: Not on file    Active member of club or organization: Not on file    Attends meetings of clubs or organizations: Not on file    Relationship status: Not on file  . Intimate partner violence    Fear of  current or ex partner: Not on file    Emotionally abused: Not on file    Physically abused: Not on file    Forced sexual activity: Not on file  Other Topics Concern  . Not on file  Social History Narrative  . Not on file    FAMILY HISTORY:  Family History  Problem Relation Age of Onset  . Kidney disease Other   . Diabetes Mother     CURRENT MEDICATIONS:  Outpatient Encounter Medications as of 02/02/2019  Medication Sig  . carvedilol (COREG) 25 MG tablet Take 25 mg by mouth 2 (two) times daily.   . hydrALAZINE (APRESOLINE) 50 MG tablet Take 50 mg by mouth every 8 (eight) hours.  . isosorbide mononitrate (IMDUR) 30 MG 24 hr tablet Take 30 mg by mouth daily.  . pravastatin (PRAVACHOL) 80 MG tablet Take 80 mg by mouth daily.  . raloxifene (EVISTA) 60 MG tablet Take 60 mg by mouth daily.  Marland Kitchen RAYALDEE 30 MCG CPCR Take 30 mcg by mouth at bedtime.   . sodium bicarbonate 650 MG tablet Take 1,300 mg by mouth 3 (three) times daily.   Marland Kitchen acetaminophen (TYLENOL) 500 MG tablet Take 1,000 mg by mouth daily as needed for moderate pain or headache.  . albuterol (PROVENTIL HFA;VENTOLIN HFA) 108 (90 BASE) MCG/ACT inhaler Inhale 1-2 puffs into the lungs every 6 (six) hours as needed for wheezing.   Marland Kitchen augmented betamethasone dipropionate (DIPROLENE-AF) 0.05 % cream APPLY CREAM TOPICALLY TWICE DAILY TO BOTTOMS OF FEET (PLANTER) AS NEEDED  . clobetasol cream (TEMOVATE) 1.47 % Apply 1 application topically daily as needed (rash).   . metolazone (ZAROXOLYN) 5 MG tablet Take 5 mg by mouth 2 (two) times daily as needed (swelling).   . torsemide (DEMADEX) 20 MG tablet Take 20 mg by mouth daily as needed (swelling).    No facility-administered encounter medications on file as of 02/02/2019.     ALLERGIES:  Allergies  Allergen Reactions  . Corticosteroids Hives, Itching, Nausea And Vomiting and Rash    Tolerated dexamethasone when being treated for a reaction to rituximab  . Rituximab Shortness Of Breath     wheezing wheezing      PHYSICAL EXAM:  ECOG Performance status: 1  Vitals:   02/02/19 1101  BP: 131/61  Pulse: 63  Resp: 18  Temp: 97.9 F (36.6 C)  SpO2: 99%   Filed Weights   02/02/19 1101  Weight: 231 lb 12.8 oz (105.1 kg)    Physical Exam Constitutional:  Appearance: Normal appearance. She is normal weight.  Cardiovascular:     Rate and Rhythm: Normal rate and regular rhythm.     Heart sounds: Normal heart sounds.  Pulmonary:     Effort: Pulmonary effort is normal.     Breath sounds: Normal breath sounds.  Abdominal:     General: Bowel sounds are normal.     Palpations: Abdomen is soft.  Musculoskeletal: Normal range of motion.  Skin:    General: Skin is warm and dry.  Neurological:     Mental Status: She is alert and oriented to person, place, and time. Mental status is at baseline.  Psychiatric:        Mood and Affect: Mood normal.        Behavior: Behavior normal.        Thought Content: Thought content normal.        Judgment: Judgment normal.      LABORATORY DATA:  I have reviewed the labs as listed.  CBC    Component Value Date/Time   WBC 7.2 02/02/2019 0949   RBC 3.32 (L) 02/02/2019 0949   HGB 10.3 (L) 02/02/2019 0949   HCT 34.0 (L) 02/02/2019 0949   PLT 215 02/02/2019 0949   MCV 102.4 (H) 02/02/2019 0949   MCH 31.0 02/02/2019 0949   MCHC 30.3 02/02/2019 0949   RDW 13.4 02/02/2019 0949   LYMPHSABS 1.3 02/02/2019 0949   MONOABS 0.4 02/02/2019 0949   EOSABS 0.2 02/02/2019 0949   BASOSABS 0.0 02/02/2019 0949   CMP Latest Ref Rng & Units 02/02/2019 11/10/2018 10/13/2018  Glucose 70 - 99 mg/dL 97 104(H) 100(H)  BUN 8 - 23 mg/dL 38(H) 40(H) 42(H)  Creatinine 0.44 - 1.00 mg/dL 3.63(H) 3.86(H) 3.99(H)  Sodium 135 - 145 mmol/L 141 143 143  Potassium 3.5 - 5.1 mmol/L 4.0 4.1 4.5  Chloride 98 - 111 mmol/L 110 110 110  CO2 22 - 32 mmol/L 21(L) 21(L) 22  Calcium 8.9 - 10.3 mg/dL 8.6(L) 8.9 9.0  Total Protein 6.5 - 8.1 g/dL 6.8 7.0 7.1  Total  Bilirubin 0.3 - 1.2 mg/dL 0.4 0.4 0.1(L)  Alkaline Phos 38 - 126 U/L 72 84 81  AST 15 - 41 U/L 16 15 16   ALT 0 - 44 U/L 14 12 11      I personally performed a face-to-face visit.  All questions were answered to patient's stated satisfaction. Encouraged patient to call with any new concerns or questions before his next visit to the cancer center and we can certain see him sooner, if needed.     ASSESSMENT & PLAN:   Anemia of renal disease 1.  Anemia: - This is due to CKD. - Patient is on Aranesp which was started 11/10/2015.  Currently on Aranesp 150 mcg every 28 days.  Last dose was on flow 09/15/2018 - Last colonoscopy was reportedly done in [place], around 7 years ago. - It is recorded that she has a history of AVMs and esophageal varices. -She does have a right forearm fistula has not started dialysis yet. - She will continue Aranesp every 8 week for hemoglobin less than 10.0 g/dL as per nephrology recommendations. -Patient had a negative SPEP. -She does not need her Aranesp today due to her hemoglobin being 10.3. -It has been 4 months since she has needed an Aranesp injection due to her keeping her hemoglobin above 10. -We will start checking labs and giving injections every 8 weeks. - Follow-up in 8 weeks for lab check and possible  injection then follow-up in 4 months with an office visit with repeat labs and possible injection.  Vitamin B12 deficiency: - Patient initially had borderline low B12 at 206. -We placed her on vitamin B12 sublingual 1 mg daily. - Today labs on 02/02/2019 showed B12 level was at 305. -We will recheck labs next visit.      Orders placed this encounter:  Orders Placed This Encounter  Procedures  . Lactate dehydrogenase  . CBC with Differential/Platelet  . Comprehensive metabolic panel  . Ferritin  . Iron and TIBC  . Vitamin B12  . VITAMIN D 25 Hydroxy (Vit-D Deficiency, Fractures)  . Folate      Francene Finders, FNP-C [place]  [phone]

## 2019-02-02 NOTE — Assessment & Plan Note (Addendum)
1.  Anemia: - This is due to CKD. - Patient is on Aranesp which was started 11/10/2015.  Currently on Aranesp 150 mcg every 28 days.  Last dose was on flow 09/15/2018 - Last colonoscopy was reportedly done in [place], around 7 years ago. - It is recorded that she has a history of AVMs and esophageal varices. -She does have a right forearm fistula has not started dialysis yet. - She will continue Aranesp every 8 week for hemoglobin less than 10.0 g/dL as per nephrology recommendations. -Patient had a negative SPEP. -She does not need her Aranesp today due to her hemoglobin being 10.3. -It has been 4 months since she has needed an Aranesp injection due to her keeping her hemoglobin above 10. -We will start checking labs and giving injections every 8 weeks. - Follow-up in 8 weeks for lab check and possible injection then follow-up in 4 months with an office visit with repeat labs and possible injection.  Vitamin B12 deficiency: - Patient initially had borderline low B12 at 206. -We placed her on vitamin B12 sublingual 1 mg daily. - Today labs on 02/02/2019 showed B12 level was at 305. -We will recheck labs next visit.

## 2019-02-02 NOTE — Addendum Note (Signed)
Addended by: Glennie [place] on: 02/02/2019 12:38 PM   Modules accepted: Orders

## 2019-02-03 LAB — VITAMIN D 25 HYDROXY (VIT D DEFICIENCY, FRACTURES): Vit D, 25-Hydroxy: 54.5 ng/mL (ref 30.0–100.0)

## 2019-03-18 ENCOUNTER — Other Ambulatory Visit (HOSPITAL_COMMUNITY): Payer: Self-pay | Admitting: Nurse Practitioner

## 2019-03-30 ENCOUNTER — Inpatient Hospital Stay (HOSPITAL_COMMUNITY): Payer: Medicare Other | Attending: Hematology

## 2019-03-30 ENCOUNTER — Other Ambulatory Visit (HOSPITAL_COMMUNITY): Payer: Self-pay | Admitting: *Deleted

## 2019-03-30 ENCOUNTER — Other Ambulatory Visit: Payer: Self-pay

## 2019-03-30 ENCOUNTER — Inpatient Hospital Stay (HOSPITAL_COMMUNITY): Payer: Medicare Other

## 2019-03-30 DIAGNOSIS — F17210 Nicotine dependence, cigarettes, uncomplicated: Secondary | ICD-10-CM | POA: Insufficient documentation

## 2019-03-30 DIAGNOSIS — K5520 Angiodysplasia of colon without hemorrhage: Secondary | ICD-10-CM | POA: Insufficient documentation

## 2019-03-30 DIAGNOSIS — E538 Deficiency of other specified B group vitamins: Secondary | ICD-10-CM | POA: Insufficient documentation

## 2019-03-30 DIAGNOSIS — N184 Chronic kidney disease, stage 4 (severe): Secondary | ICD-10-CM

## 2019-03-30 DIAGNOSIS — D631 Anemia in chronic kidney disease: Secondary | ICD-10-CM

## 2019-03-30 DIAGNOSIS — I8500 Esophageal varices without bleeding: Secondary | ICD-10-CM | POA: Insufficient documentation

## 2019-03-30 DIAGNOSIS — N189 Chronic kidney disease, unspecified: Secondary | ICD-10-CM | POA: Diagnosis present

## 2019-03-30 LAB — CBC WITH DIFFERENTIAL/PLATELET
Abs Immature Granulocytes: 0.03 10*3/uL (ref 0.00–0.07)
Basophils Absolute: 0 10*3/uL (ref 0.0–0.1)
Basophils Relative: 1 %
Eosinophils Absolute: 0.3 10*3/uL (ref 0.0–0.5)
Eosinophils Relative: 4 %
HCT: 33.5 % — ABNORMAL LOW (ref 36.0–46.0)
Hemoglobin: 10.4 g/dL — ABNORMAL LOW (ref 12.0–15.0)
Immature Granulocytes: 0 %
Lymphocytes Relative: 19 %
Lymphs Abs: 1.5 10*3/uL (ref 0.7–4.0)
MCH: 31.8 pg (ref 26.0–34.0)
MCHC: 31 g/dL (ref 30.0–36.0)
MCV: 102.4 fL — ABNORMAL HIGH (ref 80.0–100.0)
Monocytes Absolute: 0.4 10*3/uL (ref 0.1–1.0)
Monocytes Relative: 5 %
Neutro Abs: 5.4 10*3/uL (ref 1.7–7.7)
Neutrophils Relative %: 71 %
Platelets: 232 10*3/uL (ref 150–400)
RBC: 3.27 MIL/uL — ABNORMAL LOW (ref 3.87–5.11)
RDW: 13.5 % (ref 11.5–15.5)
WBC: 7.6 10*3/uL (ref 4.0–10.5)
nRBC: 0 % (ref 0.0–0.2)

## 2019-03-30 LAB — PROTEIN / CREATININE RATIO, URINE
Creatinine, Urine: 119 mg/dL
Protein Creatinine Ratio: 0.57 mg/mg{Cre} — ABNORMAL HIGH (ref 0.00–0.15)
Total Protein, Urine: 68 mg/dL

## 2019-03-30 LAB — COMPREHENSIVE METABOLIC PANEL
ALT: 14 U/L (ref 0–44)
AST: 17 U/L (ref 15–41)
Albumin: 3.4 g/dL — ABNORMAL LOW (ref 3.5–5.0)
Alkaline Phosphatase: 70 U/L (ref 38–126)
Anion gap: 10 (ref 5–15)
BUN: 29 mg/dL — ABNORMAL HIGH (ref 8–23)
CO2: 22 mmol/L (ref 22–32)
Calcium: 9 mg/dL (ref 8.9–10.3)
Chloride: 110 mmol/L (ref 98–111)
Creatinine, Ser: 3.34 mg/dL — ABNORMAL HIGH (ref 0.44–1.00)
GFR calc Af Amer: 16 mL/min — ABNORMAL LOW (ref >60)
GFR calc non Af Amer: 14 mL/min — ABNORMAL LOW (ref >60)
Glucose, Bld: 99 mg/dL (ref 70–99)
Potassium: 4 mmol/L (ref 3.5–5.1)
Sodium: 142 mmol/L (ref 135–145)
Total Bilirubin: 0.4 mg/dL (ref 0.3–1.2)
Total Protein: 6.7 g/dL (ref 6.5–8.1)

## 2019-03-30 NOTE — Progress Notes (Signed)
Shannon [name] presents today for Aranesp injection. Per parameters, injection held for hgb 10.4. Pt made aware and given copy of lab results. Dischargd in satisfactory condition with follow up instructions.

## 2019-03-31 ENCOUNTER — Other Ambulatory Visit (HOSPITAL_COMMUNITY): Payer: Self-pay | Admitting: Nurse Practitioner

## 2019-03-31 LAB — PTH, INTACT AND CALCIUM
Calcium, Total (PTH): 8.8 mg/dL (ref 8.7–10.3)
PTH: 50 pg/mL (ref 15–65)

## 2019-03-31 LAB — VITAMIN D 25 HYDROXY (VIT D DEFICIENCY, FRACTURES): Vit D, 25-Hydroxy: 54.4 ng/mL (ref 30–100)

## 2019-05-20 DIAGNOSIS — E211 Secondary hyperparathyroidism, not elsewhere classified: Secondary | ICD-10-CM | POA: Diagnosis not present

## 2019-05-20 DIAGNOSIS — D631 Anemia in chronic kidney disease: Secondary | ICD-10-CM | POA: Diagnosis not present

## 2019-05-20 DIAGNOSIS — N185 Chronic kidney disease, stage 5: Secondary | ICD-10-CM | POA: Diagnosis not present

## 2019-05-20 DIAGNOSIS — R809 Proteinuria, unspecified: Secondary | ICD-10-CM | POA: Diagnosis not present

## 2019-05-20 DIAGNOSIS — E872 Acidosis: Secondary | ICD-10-CM | POA: Diagnosis not present

## 2019-05-26 ENCOUNTER — Inpatient Hospital Stay (HOSPITAL_COMMUNITY): Payer: Medicare Other | Attending: Hematology

## 2019-05-26 ENCOUNTER — Inpatient Hospital Stay (HOSPITAL_COMMUNITY): Payer: Medicare Other

## 2019-05-26 ENCOUNTER — Inpatient Hospital Stay (HOSPITAL_BASED_OUTPATIENT_CLINIC_OR_DEPARTMENT_OTHER): Payer: Medicare Other | Admitting: Nurse Practitioner

## 2019-05-26 ENCOUNTER — Other Ambulatory Visit: Payer: Self-pay

## 2019-05-26 DIAGNOSIS — E538 Deficiency of other specified B group vitamins: Secondary | ICD-10-CM | POA: Insufficient documentation

## 2019-05-26 DIAGNOSIS — N189 Chronic kidney disease, unspecified: Secondary | ICD-10-CM

## 2019-05-26 DIAGNOSIS — Z1231 Encounter for screening mammogram for malignant neoplasm of breast: Secondary | ICD-10-CM | POA: Diagnosis not present

## 2019-05-26 DIAGNOSIS — D631 Anemia in chronic kidney disease: Secondary | ICD-10-CM

## 2019-05-26 LAB — CBC WITH DIFFERENTIAL/PLATELET
Abs Immature Granulocytes: 0.05 10*3/uL (ref 0.00–0.07)
Basophils Absolute: 0 10*3/uL (ref 0.0–0.1)
Basophils Relative: 0 %
Eosinophils Absolute: 0.4 10*3/uL (ref 0.0–0.5)
Eosinophils Relative: 4 %
HCT: 34.2 % — ABNORMAL LOW (ref 36.0–46.0)
Hemoglobin: 10.5 g/dL — ABNORMAL LOW (ref 12.0–15.0)
Immature Granulocytes: 1 %
Lymphocytes Relative: 21 %
Lymphs Abs: 1.8 10*3/uL (ref 0.7–4.0)
MCH: 31.3 pg (ref 26.0–34.0)
MCHC: 30.7 g/dL (ref 30.0–36.0)
MCV: 102.1 fL — ABNORMAL HIGH (ref 80.0–100.0)
Monocytes Absolute: 0.4 10*3/uL (ref 0.1–1.0)
Monocytes Relative: 5 %
Neutro Abs: 6.1 10*3/uL (ref 1.7–7.7)
Neutrophils Relative %: 69 %
Platelets: 244 10*3/uL (ref 150–400)
RBC: 3.35 MIL/uL — ABNORMAL LOW (ref 3.87–5.11)
RDW: 13.3 % (ref 11.5–15.5)
WBC: 8.8 10*3/uL (ref 4.0–10.5)
nRBC: 0 % (ref 0.0–0.2)

## 2019-05-26 LAB — COMPREHENSIVE METABOLIC PANEL
ALT: 12 U/L (ref 0–44)
AST: 13 U/L — ABNORMAL LOW (ref 15–41)
Albumin: 3.3 g/dL — ABNORMAL LOW (ref 3.5–5.0)
Alkaline Phosphatase: 86 U/L (ref 38–126)
Anion gap: 8 (ref 5–15)
BUN: 36 mg/dL — ABNORMAL HIGH (ref 8–23)
CO2: 22 mmol/L (ref 22–32)
Calcium: 8.7 mg/dL — ABNORMAL LOW (ref 8.9–10.3)
Chloride: 111 mmol/L (ref 98–111)
Creatinine, Ser: 3.64 mg/dL — ABNORMAL HIGH (ref 0.44–1.00)
GFR calc Af Amer: 14 mL/min — ABNORMAL LOW (ref >60)
GFR calc non Af Amer: 12 mL/min — ABNORMAL LOW (ref >60)
Glucose, Bld: 100 mg/dL — ABNORMAL HIGH (ref 70–99)
Potassium: 4.1 mmol/L (ref 3.5–5.1)
Sodium: 141 mmol/L (ref 135–145)
Total Bilirubin: 0.5 mg/dL (ref 0.3–1.2)
Total Protein: 6.8 g/dL (ref 6.5–8.1)

## 2019-05-26 LAB — LACTATE DEHYDROGENASE: LDH: 135 U/L (ref 98–192)

## 2019-05-26 LAB — IRON AND TIBC
Iron: 41 ug/dL (ref 28–170)
Saturation Ratios: 16 % (ref 10.4–31.8)
TIBC: 249 ug/dL — ABNORMAL LOW (ref 250–450)
UIBC: 208 ug/dL

## 2019-05-26 LAB — FOLATE: Folate: 7.6 ng/mL (ref >5.9)

## 2019-05-26 LAB — FERRITIN: Ferritin: 245 ng/mL (ref 11–307)

## 2019-05-26 LAB — VITAMIN B12: Vitamin B-12: 422 pg/mL (ref 180–914)

## 2019-05-26 NOTE — Progress Notes (Signed)
[place], [place] [address]   CLINIC:  Medical Oncology/Hematology  PCP:  [name], MD [place] [address] [phone]   REASON FOR VISIT: Follow-up for anemia due to CKD  CURRENT THERAPY: Aranesp injections    INTERVAL HISTORY:  Shannon [name] 64 y.o. female returns for routine follow-up for anemia.  Patient reports she has been doing well since her last visit.  She has no complaints at this time. Denies any nausea, vomiting, or diarrhea. Denies any new pains. Had not noticed any recent bleeding such as epistaxis, hematuria or hematochezia. Denies recent chest pain on exertion, shortness of breath on minimal exertion, pre-syncopal episodes, or palpitations. Denies any numbness or tingling in hands or feet. Denies any recent fevers, infections, or recent hospitalizations. Patient reports appetite at 100% and energy level at 100%.  She is eating well maintaining her weight at this time.    REVIEW OF SYSTEMS:  Review of Systems  All other systems reviewed and are negative.    PAST MEDICAL/SURGICAL HISTORY:  Past Medical History:  Diagnosis Date  . Absolute anemia 11/10/2015  . Anemia   . Anemia of renal disease 11/10/2015  . Asthma    has Albuterol inhaler prn  . CHF (congestive heart failure) ([place])    acute systolic CHF 07/[facility] [facility])  . Chronic kidney disease    Membranous nephropathy ( Seen at [facility])  . Gastric ulcer   . History of blood transfusion    no abnormal reaction noted  . History of bronchitis    last time 3-42yrs ago  . Hyperlipidemia    takes Pravastatin daily  . Hypertension    takes Imdur and Hydralazine daily as well as Coreg  . Hypothyroidism    takes Synthroid daily  . Itching    on legs-has a Kenalog cream  . Metabolic bone disease   . Nausea    takes Zofran prn  . Nonischemic cardiomyopathy ([place])    12/2011 normal coronaries, EF 22% (EF normal 04/2013)  . Obesity   . Peripheral edema     takes Torsemide daily  . Thyroid disease    hypothyroidism   Past Surgical History:  Procedure Laterality Date  . AV FISTULA PLACEMENT Left 07/22/2012   Procedure: INSERTION OF ARTERIOVENOUS GORE-TEX GRAFT ARM;  Surgeon: Angelia Mould, MD;  Location: [place];  Service: Vascular;  Laterality: Left;  . AV FISTULA PLACEMENT Right 05/24/2017   Procedure: INSERTION OF ARTERIOVENOUS (AV) GORE-TEX GRAFT RIGHT ARM;  Surgeon: Angelia Mould, MD;  Location: [place];  Service: Vascular;  Laterality: Right;  . CESAREAN SECTION     30+yrs ago  . COLONOSCOPY    . left arm surgery with pin     15+yrs ago  . TUBAL LIGATION       SOCIAL HISTORY:  Social History   Socioeconomic History  . Marital status: Single    Spouse name: Not on file  . Number of children: Not on file  . Years of education: Not on file  . Highest education level: Not on file  Occupational History  . Not on file  Tobacco Use  . Smoking status: Current Every Day Smoker    Packs/day: 1.00    Years: 40.00    Pack years: 40.00    Types: Cigarettes    Start date: 05/14/1968  . Smokeless tobacco: Never Used  Substance and Sexual Activity  . Alcohol use: No  . Drug  use: No  . Sexual activity: Not Currently  Other Topics Concern  . Not on file  Social History Narrative  . Not on file   Social Determinants of Health   Financial Resource Strain:   . Difficulty of Paying Living Expenses: Not on file  Food Insecurity:   . Worried About Charity fundraiser in the Last Year: Not on file  . Ran Out of Food in the Last Year: Not on file  Transportation Needs:   . Lack of Transportation (Medical): Not on file  . Lack of Transportation (Non-Medical): Not on file  Physical Activity:   . Days of Exercise per Week: Not on file  . Minutes of Exercise per Session: Not on file  Stress:   . Feeling of Stress : Not on file  Social Connections:   . Frequency of Communication with Friends and Family: Not on file  .  Frequency of Social Gatherings with Friends and Family: Not on file  . Attends Religious Services: Not on file  . Active Member of Clubs or Organizations: Not on file  . Attends Archivist Meetings: Not on file  . Marital Status: Not on file  Intimate Partner Violence:   . Fear of Current or Ex-Partner: Not on file  . Emotionally Abused: Not on file  . Physically Abused: Not on file  . Sexually Abused: Not on file    FAMILY HISTORY:  Family History  Problem Relation Age of Onset  . Kidney disease Other   . Diabetes Mother     CURRENT MEDICATIONS:  Outpatient Encounter Medications as of 05/26/2019  Medication Sig  . acetaminophen (TYLENOL) 500 MG tablet Take 1,000 mg by mouth daily as needed for moderate pain or headache.  . albuterol (PROVENTIL HFA;VENTOLIN HFA) 108 (90 BASE) MCG/ACT inhaler Inhale 1-2 puffs into the lungs every 6 (six) hours as needed for wheezing.   Marland Kitchen augmented betamethasone dipropionate (DIPROLENE-AF) 0.05 % cream APPLY CREAM TOPICALLY TWICE DAILY TO BOTTOMS OF FEET (PLANTER) AS NEEDED  . carvedilol (COREG) 25 MG tablet Take 25 mg by mouth 2 (two) times daily.   . clobetasol cream (TEMOVATE) 7.61 % Apply 1 application topically daily as needed (rash).   . hydrALAZINE (APRESOLINE) 50 MG tablet Take 50 mg by mouth every 8 (eight) hours.  . isosorbide mononitrate (IMDUR) 30 MG 24 hr tablet Take 30 mg by mouth daily.  . metolazone (ZAROXOLYN) 5 MG tablet Take 5 mg by mouth 2 (two) times daily as needed (swelling).   . pravastatin (PRAVACHOL) 80 MG tablet Take 80 mg by mouth daily.  . raloxifene (EVISTA) 60 MG tablet Take 60 mg by mouth daily.  Marland Kitchen RAYALDEE 30 MCG CPCR Take 30 mcg by mouth at bedtime.   . sodium bicarbonate 650 MG tablet Take 1,300 mg by mouth 3 (three) times daily.   Marland Kitchen torsemide (DEMADEX) 20 MG tablet Take 20 mg by mouth daily as needed (swelling).    No facility-administered encounter medications on file as of 05/26/2019.    ALLERGIES:   Allergies  Allergen Reactions  . Corticosteroids Hives, Itching, Nausea And Vomiting and Rash    Tolerated dexamethasone when being treated for a reaction to rituximab  . Rituximab Shortness Of Breath    wheezing wheezing      Vital signs: -Deferred due to telephone visit   Physical Exam -Deferred due to telephone visit -Patient was alert and oriented over the phone and in no acute distress.  LABORATORY DATA:  I  have reviewed the labs as listed.  CBC    Component Value Date/Time   WBC 8.8 05/26/2019 1344   RBC 3.35 (L) 05/26/2019 1344   HGB 10.5 (L) 05/26/2019 1344   HCT 34.2 (L) 05/26/2019 1344   PLT 244 05/26/2019 1344   MCV 102.1 (H) 05/26/2019 1344   MCH 31.3 05/26/2019 1344   MCHC 30.7 05/26/2019 1344   RDW 13.3 05/26/2019 1344   LYMPHSABS 1.8 05/26/2019 1344   MONOABS 0.4 05/26/2019 1344   EOSABS 0.4 05/26/2019 1344   BASOSABS 0.0 05/26/2019 1344   CMP Latest Ref Rng & Units 05/26/2019 03/30/2019 03/30/2019  Glucose 70 - 99 mg/dL 100(H) 99 -  BUN 8 - 23 mg/dL 36(H) 29(H) -  Creatinine 0.44 - 1.00 mg/dL 3.64(H) 3.34(H) -  Sodium 135 - 145 mmol/L 141 142 -  Potassium 3.5 - 5.1 mmol/L 4.1 4.0 -  Chloride 98 - 111 mmol/L 111 110 -  CO2 22 - 32 mmol/L 22 22 -  Calcium 8.9 - 10.3 mg/dL 8.7(L) 9.0 8.8  Total Protein 6.5 - 8.1 g/dL 6.8 6.7 -  Total Bilirubin 0.3 - 1.2 mg/dL 0.5 0.4 -  Alkaline Phos 38 - 126 U/L 86 70 -  AST 15 - 41 U/L 13(L) 17 -  ALT 0 - 44 U/L 12 14 -     All questions were answered to patient's stated satisfaction. Encouraged patient to call with any new concerns or questions before his next visit to the cancer center and we can certain see him sooner, if needed.     ASSESSMENT & PLAN:   Anemia of renal disease 1.  Anemia: - This is due to CKD. - Patient is on Aranesp which was started 11/10/2015.  Currently on Aranesp 150 mcg every 28 days.  Last dose was on flow 09/15/2018 - Last colonoscopy was reportedly done in [place], around 7 years  ago. - It is recorded that she has a history of AVMs and esophageal varices. -She does have a right forearm fistula has not started dialysis yet. - She will continue Aranesp every 8 week for hemoglobin less than 10.0 g/dL as per nephrology recommendations. -Patient had a negative SPEP. -She does not need her Aranesp today due to her hemoglobin being 10.5. -It has been 7 months since she has needed an Aranesp injection due to her keeping her hemoglobin above 10. -We will continue checking labs and giving injections every 8 weeks. - Follow-up in 2 months with labs and injection.   Vitamin B12 deficiency: - Patient initially had borderline low B12 at 206. -We placed her on vitamin B12 sublingual 1 mg daily. - Today labs on 02/02/2019 showed B12 level was at 305. -We will recheck labs next visit.      Orders placed this encounter:  Orders Placed This Encounter  Procedures  . Lactate dehydrogenase  . CBC with Differential/Platelet  . Comprehensive metabolic panel  . Ferritin  . Iron and TIBC  . Vitamin B12  . Vitamin D 25 hydroxy  . Folate     Shannon Finders, FNP-C [place] [phone]

## 2019-05-26 NOTE — Progress Notes (Signed)
Hgb 10.5 today.  Patient does not need injection.  To return as scheduled.  Patient verbalized understanding.

## 2019-05-26 NOTE — Assessment & Plan Note (Addendum)
1.  Anemia: - This is due to CKD. - Patient is on Aranesp which was started 11/10/2015.  Currently on Aranesp 150 mcg every 28 days.  Last dose was on flow 09/15/2018 - Last colonoscopy was reportedly done in [place], around 7 years ago. - It is recorded that she has a history of AVMs and esophageal varices. -She does have a right forearm fistula has not started dialysis yet. - She will continue Aranesp every 8 week for hemoglobin less than 10.0 g/dL as per nephrology recommendations. -Patient had a negative SPEP. -She does not need her Aranesp today due to her hemoglobin being 10.5. -It has been 7 months since she has needed an Aranesp injection due to her keeping her hemoglobin above 10. -We will continue checking labs and giving injections every 8 weeks. - Follow-up in 2 months with labs and injection.   Vitamin B12 deficiency: - Patient initially had borderline low B12 at 206. -We placed her on vitamin B12 sublingual 1 mg daily. - Today labs on 02/02/2019 showed B12 level was at 305. -We will recheck labs next visit.

## 2019-05-27 LAB — VITAMIN D 25 HYDROXY (VIT D DEFICIENCY, FRACTURES): Vit D, 25-Hydroxy: 53.06 ng/mL (ref 30–100)

## 2019-06-01 DIAGNOSIS — E7800 Pure hypercholesterolemia, unspecified: Secondary | ICD-10-CM | POA: Diagnosis not present

## 2019-06-01 DIAGNOSIS — I10 Essential (primary) hypertension: Secondary | ICD-10-CM | POA: Diagnosis not present

## 2019-06-01 DIAGNOSIS — I509 Heart failure, unspecified: Secondary | ICD-10-CM | POA: Diagnosis not present

## 2019-06-10 DIAGNOSIS — I10 Essential (primary) hypertension: Secondary | ICD-10-CM | POA: Diagnosis not present

## 2019-06-10 DIAGNOSIS — E7800 Pure hypercholesterolemia, unspecified: Secondary | ICD-10-CM | POA: Diagnosis not present

## 2019-06-10 DIAGNOSIS — N183 Chronic kidney disease, stage 3 unspecified: Secondary | ICD-10-CM | POA: Diagnosis not present

## 2019-06-10 DIAGNOSIS — F17210 Nicotine dependence, cigarettes, uncomplicated: Secondary | ICD-10-CM | POA: Diagnosis not present

## 2019-06-10 DIAGNOSIS — Z299 Encounter for prophylactic measures, unspecified: Secondary | ICD-10-CM | POA: Diagnosis not present

## 2019-07-01 DIAGNOSIS — Z299 Encounter for prophylactic measures, unspecified: Secondary | ICD-10-CM | POA: Diagnosis not present

## 2019-07-01 DIAGNOSIS — I10 Essential (primary) hypertension: Secondary | ICD-10-CM | POA: Diagnosis not present

## 2019-07-01 DIAGNOSIS — I429 Cardiomyopathy, unspecified: Secondary | ICD-10-CM | POA: Diagnosis not present

## 2019-07-01 DIAGNOSIS — N184 Chronic kidney disease, stage 4 (severe): Secondary | ICD-10-CM | POA: Diagnosis not present

## 2019-07-01 DIAGNOSIS — E7800 Pure hypercholesterolemia, unspecified: Secondary | ICD-10-CM | POA: Diagnosis not present

## 2019-07-15 DIAGNOSIS — D631 Anemia in chronic kidney disease: Secondary | ICD-10-CM | POA: Diagnosis not present

## 2019-07-15 DIAGNOSIS — E211 Secondary hyperparathyroidism, not elsewhere classified: Secondary | ICD-10-CM | POA: Diagnosis not present

## 2019-07-15 DIAGNOSIS — E872 Acidosis: Secondary | ICD-10-CM | POA: Diagnosis not present

## 2019-07-15 DIAGNOSIS — R809 Proteinuria, unspecified: Secondary | ICD-10-CM | POA: Diagnosis not present

## 2019-07-15 DIAGNOSIS — N185 Chronic kidney disease, stage 5: Secondary | ICD-10-CM | POA: Diagnosis not present

## 2019-07-22 DIAGNOSIS — D631 Anemia in chronic kidney disease: Secondary | ICD-10-CM | POA: Diagnosis not present

## 2019-07-22 DIAGNOSIS — E211 Secondary hyperparathyroidism, not elsewhere classified: Secondary | ICD-10-CM | POA: Diagnosis not present

## 2019-07-22 DIAGNOSIS — E872 Acidosis: Secondary | ICD-10-CM | POA: Diagnosis not present

## 2019-07-22 DIAGNOSIS — R809 Proteinuria, unspecified: Secondary | ICD-10-CM | POA: Diagnosis not present

## 2019-07-22 DIAGNOSIS — N185 Chronic kidney disease, stage 5: Secondary | ICD-10-CM | POA: Diagnosis not present

## 2019-07-24 ENCOUNTER — Inpatient Hospital Stay (HOSPITAL_COMMUNITY): Payer: Medicare Other | Attending: Hematology

## 2019-07-24 ENCOUNTER — Other Ambulatory Visit: Payer: Self-pay

## 2019-07-24 ENCOUNTER — Inpatient Hospital Stay (HOSPITAL_BASED_OUTPATIENT_CLINIC_OR_DEPARTMENT_OTHER): Payer: Medicare Other | Admitting: Nurse Practitioner

## 2019-07-24 ENCOUNTER — Inpatient Hospital Stay (HOSPITAL_COMMUNITY): Payer: Medicare Other

## 2019-07-24 DIAGNOSIS — E538 Deficiency of other specified B group vitamins: Secondary | ICD-10-CM | POA: Insufficient documentation

## 2019-07-24 DIAGNOSIS — D631 Anemia in chronic kidney disease: Secondary | ICD-10-CM

## 2019-07-24 DIAGNOSIS — N189 Chronic kidney disease, unspecified: Secondary | ICD-10-CM | POA: Diagnosis not present

## 2019-07-24 DIAGNOSIS — I509 Heart failure, unspecified: Secondary | ICD-10-CM | POA: Insufficient documentation

## 2019-07-24 DIAGNOSIS — E039 Hypothyroidism, unspecified: Secondary | ICD-10-CM | POA: Insufficient documentation

## 2019-07-24 DIAGNOSIS — I130 Hypertensive heart and chronic kidney disease with heart failure and stage 1 through stage 4 chronic kidney disease, or unspecified chronic kidney disease: Secondary | ICD-10-CM | POA: Diagnosis not present

## 2019-07-24 DIAGNOSIS — E669 Obesity, unspecified: Secondary | ICD-10-CM | POA: Insufficient documentation

## 2019-07-24 LAB — CBC WITH DIFFERENTIAL/PLATELET
Abs Immature Granulocytes: 0.02 10*3/uL (ref 0.00–0.07)
Basophils Absolute: 0 10*3/uL (ref 0.0–0.1)
Basophils Relative: 1 %
Eosinophils Absolute: 0.3 10*3/uL (ref 0.0–0.5)
Eosinophils Relative: 4 %
HCT: 33.9 % — ABNORMAL LOW (ref 36.0–46.0)
Hemoglobin: 10.6 g/dL — ABNORMAL LOW (ref 12.0–15.0)
Immature Granulocytes: 0 %
Lymphocytes Relative: 19 %
Lymphs Abs: 1.4 10*3/uL (ref 0.7–4.0)
MCH: 31.6 pg (ref 26.0–34.0)
MCHC: 31.3 g/dL (ref 30.0–36.0)
MCV: 101.2 fL — ABNORMAL HIGH (ref 80.0–100.0)
Monocytes Absolute: 0.3 10*3/uL (ref 0.1–1.0)
Monocytes Relative: 5 %
Neutro Abs: 5.2 10*3/uL (ref 1.7–7.7)
Neutrophils Relative %: 71 %
Platelets: 206 10*3/uL (ref 150–400)
RBC: 3.35 MIL/uL — ABNORMAL LOW (ref 3.87–5.11)
RDW: 13.2 % (ref 11.5–15.5)
WBC: 7.3 10*3/uL (ref 4.0–10.5)
nRBC: 0 % (ref 0.0–0.2)

## 2019-07-24 LAB — FERRITIN: Ferritin: 246 ng/mL (ref 11–307)

## 2019-07-24 LAB — COMPREHENSIVE METABOLIC PANEL
ALT: 12 U/L (ref 0–44)
AST: 13 U/L — ABNORMAL LOW (ref 15–41)
Albumin: 3.4 g/dL — ABNORMAL LOW (ref 3.5–5.0)
Alkaline Phosphatase: 78 U/L (ref 38–126)
Anion gap: 9 (ref 5–15)
BUN: 28 mg/dL — ABNORMAL HIGH (ref 8–23)
CO2: 22 mmol/L (ref 22–32)
Calcium: 8.8 mg/dL — ABNORMAL LOW (ref 8.9–10.3)
Chloride: 108 mmol/L (ref 98–111)
Creatinine, Ser: 3.63 mg/dL — ABNORMAL HIGH (ref 0.44–1.00)
GFR calc Af Amer: 15 mL/min — ABNORMAL LOW (ref >60)
GFR calc non Af Amer: 13 mL/min — ABNORMAL LOW (ref >60)
Glucose, Bld: 106 mg/dL — ABNORMAL HIGH (ref 70–99)
Potassium: 3.9 mmol/L (ref 3.5–5.1)
Sodium: 139 mmol/L (ref 135–145)
Total Bilirubin: 0.7 mg/dL (ref 0.3–1.2)
Total Protein: 6.7 g/dL (ref 6.5–8.1)

## 2019-07-24 LAB — IRON AND TIBC
Iron: 45 ug/dL (ref 28–170)
Saturation Ratios: 19 % (ref 10.4–31.8)
TIBC: 232 ug/dL — ABNORMAL LOW (ref 250–450)
UIBC: 187 ug/dL

## 2019-07-24 LAB — VITAMIN D 25 HYDROXY (VIT D DEFICIENCY, FRACTURES): Vit D, 25-Hydroxy: 57.52 ng/mL (ref 30–100)

## 2019-07-24 LAB — VITAMIN B12: Vitamin B-12: 359 pg/mL (ref 180–914)

## 2019-07-24 LAB — FOLATE: Folate: 10.1 ng/mL (ref >5.9)

## 2019-07-24 LAB — LACTATE DEHYDROGENASE: LDH: 127 U/L (ref 98–192)

## 2019-07-24 NOTE — Progress Notes (Signed)
Hemoglobin 10.6 today. Will hold retacrit per protocol.

## 2019-07-24 NOTE — Patient Instructions (Signed)
[place] at [facility] Discharge Instructions  Follow up in 12 weeks with labs and possible injection   Thank you for choosing [facility] at [facility] to provide your oncology and hematology care.  To afford each patient quality time with our provider, please arrive at least 15 minutes before your scheduled appointment time.   If you have a lab appointment with the [facility] please come in thru the Main Entrance and check in at the main information desk.  You need to re-schedule your appointment should you arrive 10 or more minutes late.  We strive to give you quality time with our providers, and arriving late affects you and other patients whose appointments are after yours.  Also, if you no show three or more times for appointments you may be dismissed from the clinic at the providers discretion.     Again, thank you for choosing [facility].  Our hope is that these requests will decrease the amount of time that you wait before being seen by our physicians.       _____________________________________________________________  Should you have questions after your visit to [facility], please contact our office at [phone] between the hours of 8:00 a.m. and 4:30 p.m.  Voicemails left after 4:00 p.m. will not be returned until the following business day.  For prescription refill requests, have your pharmacy contact our office and allow 72 hours.    Due to Covid, you will need to wear a mask upon entering the hospital. If you do not have a mask, a mask will be given to you at the Main Entrance upon arrival. For doctor visits, patients may have 1 support person with them. For treatment visits, patients can not have anyone with them due to social distancing guidelines and our immunocompromised population.

## 2019-07-24 NOTE — Assessment & Plan Note (Addendum)
1.  Anemia: - This is due to CKD. - Patient is on Aranesp which was started 11/10/2015.  Currently on Aranesp 150 mcg every 28 days.  Last dose was on flow 09/15/2018 - Last colonoscopy was reportedly done in [employer], around 7 years ago. - It is recorded that she has a history of AVMs and esophageal varices. -She does have a right forearm fistula has not started dialysis yet. - She will continue Aranesp every 12 week for hemoglobin less than 10.0 g/dL as per nephrology recommendations. -Patient had a negative SPEP. -It has been 10 months since she has needed an Aranesp injection due to her keeping her hemoglobin above 10. -We will continue checking labs and giving injections every 12 weeks. -Labs done on 07/24/2019 showed hemoglobin 10.6 - Follow-up in 3 months with labs and injection.   Vitamin B12 deficiency: - Patient initially had borderline low B12 at 206. -We placed her on vitamin B12 sublingual 1 mg daily. - Today labs on 07/24/2019 showed B12 level was at 359 -We will recheck labs next visit.

## 2019-07-24 NOTE — Progress Notes (Signed)
[name], [place] [address]   CLINIC:  Medical Oncology/Hematology  PCP:  [name], MD [place]  [id number] [phone]   REASON FOR VISIT: Follow-up for anemia related to CKD  CURRENT THERAPY: Observation   INTERVAL HISTORY:  Shannon [name] 64 y.o. female returns for routine follow-up for anemia related to CKD.  Patient reports she has been doing well since her last visit.  She denies any extreme fatigue.  She denies any easy bruising or bleeding.  She reports she is still not receiving about dialysis at this time.  She reports both of her fistulas have clotted off and they are on hold at this time. Denies any nausea, vomiting, or diarrhea. Denies any new pains. Had not noticed any recent bleeding such as epistaxis, hematuria or hematochezia. Denies recent chest pain on exertion, shortness of breath on minimal exertion, pre-syncopal episodes, or palpitations. Denies any numbness or tingling in hands or feet. Denies any recent fevers, infections, or recent hospitalizations. Patient reports appetite at 100% and energy level at 75%.  She is eating well maintain her weight at this time.    REVIEW OF SYSTEMS:  Review of Systems  Neurological: Positive for numbness.  All other systems reviewed and are negative.    PAST MEDICAL/SURGICAL HISTORY:  Past Medical History:  Diagnosis Date  . Absolute anemia 11/10/2015  . Anemia   . Anemia of renal disease 11/10/2015  . Asthma    has Albuterol inhaler prn  . CHF (congestive heart failure) ([place])    acute systolic CHF 08/971 [facility])  . Chronic kidney disease    Membranous nephropathy ( Seen at [facility])  . Gastric ulcer   . History of blood transfusion    no abnormal reaction noted  . History of bronchitis    last time 3-63yrs ago  . Hyperlipidemia    takes Pravastatin daily  . Hypertension    takes Imdur and Hydralazine daily as well as Coreg  . Hypothyroidism    takes Synthroid  daily  . Itching    on legs-has a Kenalog cream  . Metabolic bone disease   . Nausea    takes Zofran prn  . Nonischemic cardiomyopathy ([place])    12/2011 normal coronaries, EF 22% (EF normal 04/2013)  . Obesity   . Peripheral edema    takes Torsemide daily  . Thyroid disease    hypothyroidism   Past Surgical History:  Procedure Laterality Date  . AV FISTULA PLACEMENT Left 07/22/2012   Procedure: INSERTION OF ARTERIOVENOUS GORE-TEX GRAFT ARM;  Surgeon: Angelia Mould, MD;  Location: [place];  Service: Vascular;  Laterality: Left;  . AV FISTULA PLACEMENT Right 05/24/2017   Procedure: INSERTION OF ARTERIOVENOUS (AV) GORE-TEX GRAFT RIGHT ARM;  Surgeon: Angelia Mould, MD;  Location: [place];  Service: Vascular;  Laterality: Right;  . CESAREAN SECTION     30+yrs ago  . COLONOSCOPY    . left arm surgery with pin     15+yrs ago  . TUBAL LIGATION       SOCIAL HISTORY:  Social History   Socioeconomic History  . Marital status: Single    Spouse name: Not on file  . Number of children: Not on file  . Years of education: Not on file  . Highest education level: Not on file  Occupational History  . Not on file  Tobacco Use  . Smoking status: Current Every Day Smoker  Packs/day: 1.00    Years: 40.00    Pack years: 40.00    Types: Cigarettes    Start date: 05/14/1968  . Smokeless tobacco: Never Used  Substance and Sexual Activity  . Alcohol use: No  . Drug use: No  . Sexual activity: Not Currently  Other Topics Concern  . Not on file  Social History Narrative  . Not on file   Social Determinants of Health   Financial Resource Strain:   . Difficulty of Paying Living Expenses:   Food Insecurity:   . Worried About Charity fundraiser in the Last Year:   . Arboriculturist in the Last Year:   Transportation Needs:   . Film/video editor (Medical):   Marland Kitchen Lack of Transportation (Non-Medical):   Physical Activity:   . Days of Exercise per Week:   . Minutes of  Exercise per Session:   Stress:   . Feeling of Stress :   Social Connections:   . Frequency of Communication with Friends and Family:   . Frequency of Social Gatherings with Friends and Family:   . Attends Religious Services:   . Active Member of Clubs or Organizations:   . Attends Archivist Meetings:   Marland Kitchen Marital Status:   Intimate Partner Violence:   . Fear of Current or Ex-Partner:   . Emotionally Abused:   Marland Kitchen Physically Abused:   . Sexually Abused:     FAMILY HISTORY:  Family History  Problem Relation Age of Onset  . Kidney disease Other   . Diabetes Mother     CURRENT MEDICATIONS:  Outpatient Encounter Medications as of 07/24/2019  Medication Sig  . carvedilol (COREG) 25 MG tablet Take 25 mg by mouth 2 (two) times daily.   . hydrALAZINE (APRESOLINE) 50 MG tablet Take 50 mg by mouth every 8 (eight) hours.  . isosorbide mononitrate (IMDUR) 30 MG 24 hr tablet Take 30 mg by mouth daily.  . pravastatin (PRAVACHOL) 80 MG tablet Take 80 mg by mouth daily.  . raloxifene (EVISTA) 60 MG tablet Take 60 mg by mouth daily.  Marland Kitchen RAYALDEE 30 MCG CPCR Take 30 mcg by mouth at bedtime.   . sodium bicarbonate 650 MG tablet Take 1,300 mg by mouth 3 (three) times daily.   Marland Kitchen acetaminophen (TYLENOL) 500 MG tablet Take 1,000 mg by mouth daily as needed for moderate pain or headache.  . albuterol (PROVENTIL HFA;VENTOLIN HFA) 108 (90 BASE) MCG/ACT inhaler Inhale 1-2 puffs into the lungs every 6 (six) hours as needed for wheezing.   Marland Kitchen augmented betamethasone dipropionate (DIPROLENE-AF) 0.05 % cream APPLY CREAM TOPICALLY TWICE DAILY TO BOTTOMS OF FEET (PLANTER) AS NEEDED  . clobetasol cream (TEMOVATE) 9.70 % Apply 1 application topically daily as needed (rash).   . metolazone (ZAROXOLYN) 5 MG tablet Take 5 mg by mouth 2 (two) times daily as needed (swelling).   . torsemide (DEMADEX) 20 MG tablet Take 20 mg by mouth daily as needed (swelling).    No facility-administered encounter medications  on file as of 07/24/2019.    ALLERGIES:  Allergies  Allergen Reactions  . Corticosteroids Hives, Itching, Nausea And Vomiting and Rash    Tolerated dexamethasone when being treated for a reaction to rituximab  . Other Hives, Itching, Nausea And Vomiting and Rash    Tolerated dexamethasone when being treated for a reaction to rituximab Tolerated dexamethasone when being treated for a reaction to rituximab  Tolerated dexamethasone when being treated for a reaction to  rituximab  . Rituximab Shortness Of Breath    wheezing wheezing      PHYSICAL EXAM:  ECOG Performance status: 1  Vitals:   07/24/19 1045  BP: (!) 109/53  Pulse: 70  Resp: 16  Temp: (!) 97.3 F (36.3 C)  SpO2: 96%   Filed Weights   07/24/19 1045  Weight: 236 lb 6.4 oz (107.2 kg)    Physical Exam Constitutional:      Appearance: Normal appearance. She is normal weight.  Cardiovascular:     Rate and Rhythm: Normal rate and regular rhythm.     Heart sounds: Normal heart sounds.  Pulmonary:     Effort: Pulmonary effort is normal.     Breath sounds: Normal breath sounds.  Abdominal:     General: Bowel sounds are normal.     Palpations: Abdomen is soft.  Musculoskeletal:        General: Normal range of motion.  Skin:    General: Skin is warm.  Neurological:     Mental Status: She is alert and oriented to person, place, and time. Mental status is at baseline.  Psychiatric:        Mood and Affect: Mood normal.        Behavior: Behavior normal.        Thought Content: Thought content normal.        Judgment: Judgment normal.      LABORATORY DATA:  I have reviewed the labs as listed.  CBC    Component Value Date/Time   WBC 7.3 07/24/2019 1010   RBC 3.35 (L) 07/24/2019 1010   HGB 10.6 (L) 07/24/2019 1010   HCT 33.9 (L) 07/24/2019 1010   PLT 206 07/24/2019 1010   MCV 101.2 (H) 07/24/2019 1010   MCH 31.6 07/24/2019 1010   MCHC 31.3 07/24/2019 1010   RDW 13.2 07/24/2019 1010   LYMPHSABS 1.4  07/24/2019 1010   MONOABS 0.3 07/24/2019 1010   EOSABS 0.3 07/24/2019 1010   BASOSABS 0.0 07/24/2019 1010   CMP Latest Ref Rng & Units 07/24/2019 05/26/2019 03/30/2019  Glucose 70 - 99 mg/dL 106(H) 100(H) 99  BUN 8 - 23 mg/dL 28(H) 36(H) 29(H)  Creatinine 0.44 - 1.00 mg/dL 3.63(H) 3.64(H) 3.34(H)  Sodium 135 - 145 mmol/L 139 141 142  Potassium 3.5 - 5.1 mmol/L 3.9 4.1 4.0  Chloride 98 - 111 mmol/L 108 111 110  CO2 22 - 32 mmol/L 22 22 22   Calcium 8.9 - 10.3 mg/dL 8.8(L) 8.7(L) 9.0  Total Protein 6.5 - 8.1 g/dL 6.7 6.8 6.7  Total Bilirubin 0.3 - 1.2 mg/dL 0.7 0.5 0.4  Alkaline Phos 38 - 126 U/L 78 86 70  AST 15 - 41 U/L 13(L) 13(L) 17  ALT 0 - 44 U/L 12 12 14      I personally performed a face-to-face visit.  All questions were answered to patient's stated satisfaction. Encouraged patient to call with any new concerns or questions before his next visit to the cancer center and we can certain see him sooner, if needed.     ASSESSMENT & PLAN:   Anemia of renal disease 1.  Anemia: - This is due to CKD. - Patient is on Aranesp which was started 11/10/2015.  Currently on Aranesp 150 mcg every 28 days.  Last dose was on flow 09/15/2018 - Last colonoscopy was reportedly done in [place], around 7 years ago. - It is recorded that she has a history of AVMs and esophageal varices. -She does have a right forearm  fistula has not started dialysis yet. - She will continue Aranesp every 12 week for hemoglobin less than 10.0 g/dL as per nephrology recommendations. -Patient had a negative SPEP. -It has been 10 months since she has needed an Aranesp injection due to her keeping her hemoglobin above 10. -We will continue checking labs and giving injections every 12 weeks. -Labs done on 07/24/2019 showed hemoglobin 10.6 - Follow-up in 3 months with labs and injection.   Vitamin B12 deficiency: - Patient initially had borderline low B12 at 206. -We placed her on vitamin B12 sublingual 1 mg daily. - Today  labs on 07/24/2019 showed B12 level was at 359 -We will recheck labs next visit.      Orders placed this encounter:  Orders Placed This Encounter  Procedures  . Lactate dehydrogenase  . CBC with Differential/Platelet  . Comprehensive metabolic panel  . Ferritin  . Iron and TIBC  . Vitamin B12  . VITAMIN D 25 Hydroxy (Vit-D Deficiency, Fractures)  . Folate      Francene Finders, FNP-C [place] [phone]

## 2019-07-30 ENCOUNTER — Ambulatory Visit: Payer: Medicare Other

## 2019-07-31 ENCOUNTER — Ambulatory Visit: Payer: Medicare Other | Attending: Internal Medicine

## 2019-07-31 DIAGNOSIS — Z23 Encounter for immunization: Secondary | ICD-10-CM

## 2019-07-31 NOTE — Progress Notes (Signed)
   Covid-19 Vaccination Clinic  Name:  KRISTA GODSIL    MRN: [id number] DOB: 04-11-1956  07/31/2019  Ms. Rembert was observed post Covid-19 immunization for 15 minutes without incident. She was provided with Vaccine Information Sheet and instruction to access the V-Safe system.   Ms. Fera was instructed to call 911 with any severe reactions post vaccine: Marland Kitchen Difficulty breathing  . Swelling of face and throat  . A fast heartbeat  . A bad rash all over body  . Dizziness and weakness   Immunizations Administered    Name Date Dose VIS Date Route   Moderna COVID-19 Vaccine 07/31/2019 10:44 AM 0.5 mL 04/14/2019 Intramuscular   Manufacturer: Moderna   Lot: [id number]   [place]: [id number]

## 2019-08-04 DIAGNOSIS — I509 Heart failure, unspecified: Secondary | ICD-10-CM | POA: Diagnosis not present

## 2019-08-04 DIAGNOSIS — R87613 High grade squamous intraepithelial lesion on cytologic smear of cervix (HGSIL): Secondary | ICD-10-CM | POA: Diagnosis not present

## 2019-08-04 DIAGNOSIS — N950 Postmenopausal bleeding: Secondary | ICD-10-CM | POA: Diagnosis not present

## 2019-08-04 DIAGNOSIS — Z79899 Other long term (current) drug therapy: Secondary | ICD-10-CM | POA: Diagnosis not present

## 2019-08-04 DIAGNOSIS — I110 Hypertensive heart disease with heart failure: Secondary | ICD-10-CM | POA: Diagnosis not present

## 2019-08-04 DIAGNOSIS — Z01818 Encounter for other preprocedural examination: Secondary | ICD-10-CM | POA: Diagnosis not present

## 2019-08-04 DIAGNOSIS — E7800 Pure hypercholesterolemia, unspecified: Secondary | ICD-10-CM | POA: Diagnosis not present

## 2019-08-04 DIAGNOSIS — N882 Stricture and stenosis of cervix uteri: Secondary | ICD-10-CM | POA: Diagnosis not present

## 2019-08-06 DIAGNOSIS — N882 Stricture and stenosis of cervix uteri: Secondary | ICD-10-CM | POA: Diagnosis not present

## 2019-08-06 DIAGNOSIS — E7800 Pure hypercholesterolemia, unspecified: Secondary | ICD-10-CM | POA: Diagnosis not present

## 2019-08-06 DIAGNOSIS — R87613 High grade squamous intraepithelial lesion on cytologic smear of cervix (HGSIL): Secondary | ICD-10-CM | POA: Diagnosis not present

## 2019-08-06 DIAGNOSIS — I509 Heart failure, unspecified: Secondary | ICD-10-CM | POA: Diagnosis not present

## 2019-08-06 DIAGNOSIS — Z79899 Other long term (current) drug therapy: Secondary | ICD-10-CM | POA: Diagnosis not present

## 2019-08-06 DIAGNOSIS — I110 Hypertensive heart disease with heart failure: Secondary | ICD-10-CM | POA: Diagnosis not present

## 2019-08-06 DIAGNOSIS — N950 Postmenopausal bleeding: Secondary | ICD-10-CM | POA: Diagnosis not present

## 2019-08-06 DIAGNOSIS — J45909 Unspecified asthma, uncomplicated: Secondary | ICD-10-CM | POA: Diagnosis not present

## 2019-08-12 DIAGNOSIS — E7800 Pure hypercholesterolemia, unspecified: Secondary | ICD-10-CM | POA: Diagnosis not present

## 2019-08-12 DIAGNOSIS — I509 Heart failure, unspecified: Secondary | ICD-10-CM | POA: Diagnosis not present

## 2019-08-12 DIAGNOSIS — I10 Essential (primary) hypertension: Secondary | ICD-10-CM | POA: Diagnosis not present

## 2019-08-14 ENCOUNTER — Telehealth: Payer: Self-pay | Admitting: *Deleted

## 2019-08-14 NOTE — Telephone Encounter (Signed)
Called and spoke with the patient, scheduled a new patient appt for 4/8 at 2:30pm. Gave the patient the address and phone number for the clinic. Explained that she can one visitor and will have a pelvic exam

## 2019-08-19 NOTE — Progress Notes (Addendum)
GYNECOLOGIC ONCOLOGY NEW PATIENT CONSULTATION   Patient Name: Shannon [name]  Patient Age: 64 y.o. Date of Service: 08/20/19 Referring Provider: [name], MD [place],  [place] [id number]   Primary Care Provider: [name], MD Consulting Provider: Jeral Pinch, MD   Assessment/Plan:  Clinical stage I grade 1 endometrioid adenocarcinoma in a medically complex patient.  We reviewed the nature of endometrial cancer and its recommended surgical staging, including total hysterectomy, bilateral salpingo-oophorectomy, and lymph node assessment. The patient is a suitable candidate for staging via a minimally invasive approach to surgery.  We reviewed that robotic assistance would be used to complete the surgery.   Given her significant kidney disease, discussed plan for medical clearance as well as clearance from her nephrologist.  If patient unable to have robotic hysterectomy and staging, discussed plan for D&C with Mirena IUD placement.  We discussed that most endometrial cancer is detected early and that decisions regarding adjuvant therapy will be made based on her final pathology.   We reviewed the sentinel lymph node technique. Risks and benefits of sentinel lymph node biopsy was reviewed. We reviewed the technique and ICG dye. The patient DOES NOT have an iodine allergy or known liver dysfunction. We reviewed the false negative rate (0.4%), and that 3% of patients with metastatic disease will not have it detected by SLN biopsy in endometrial cancer. A low risk of allergic reaction to the dye, <0.2% for ICG, has been reported. We also discussed that in the case of failed mapping, which occurs 40% of the time, a bilateral or unilateral lymphadenectomy will be performed at the surgeon's discretion.   Potential benefits of sentinel nodes including a higher detection rate for metastasis due to ultrastaging and potential reduction in operative morbidity. However, there remains  uncertainty as to the role for treatment of micrometastatic disease. Further, the benefit of operative morbidity associated with the SLN technique in endometrial cancer is not yet completely known. In other patient populations (e.g. the cervical cancer population) there has been observed reductions in morbidity with SLN biopsy compared to pelvic lymphadenectomy. Lymphedema, nerve dysfunction and lymphocysts are all potential risks with the SLN technique as with complete lymphadenectomy. Additional risks to the patient include the risk of damage to an internal organ while operating in an altered view (e.g. the black and white image of the robotic fluorescence imaging mode).   We discussed plan for a robotic assisted hysterectomy, bilateral salpingo-oophorectomy, sentinel lymph node evaluation, possible lymph node dissection, possible laparotomy. The risks of surgery were discussed in detail and she understands these to include infection; wound separation; hernia; vaginal cuff separation, injury to adjacent organs such as bowel, bladder, blood vessels, ureters and nerves; bleeding which may require blood transfusion; anesthesia risk; thromboembolic events; possible death; unforeseen complications; possible need for re-exploration; medical complications such as heart attack, stroke, pleural effusion and pneumonia; and, if full lymphadenectomy is performed the risk of lymphedema and lymphocyst. The patient will receive DVT and antibiotic prophylaxis as indicated. She voiced a clear understanding. She had the opportunity to ask questions. Perioperative instructions were reviewed with her. Prescriptions for post-op medications were sent to her pharmacy of choice.  Tentative surgical date in mid May given need for surgical clearance as well as waiting 6 weeks from her cold knife cone.  A copy of this note was sent to the patient's referring provider.   45 minutes of total time was spent for this patient encounter,  including preparation, face-to-face counseling  with the patient and coordination of care, and documentation of the encounter.   Jeral Pinch, MD  Division of Gynecologic Oncology  Department of Obstetrics and Gynecology  [facility]  ___________________________________________  Chief Complaint: Chief Complaint  Patient presents with  . Endometrial/uterine adenocarcinoma ([place])    New Patient    History of Present Illness:  Shannon [name] is a 64 y.o. y.o. female who is seen in consultation at the request of Dr. Evie Lacks for an evaluation of newly diagnosed endometrial cancer.  The patient presented with PMB and cervical stenosis; EMB in clinic was not possible due to stenosis. An ultrasound showed findings concerning for adenomyosis as well as a thickened heterogenous endometrial lining with increased blood flow. A pap test returned with HSIL. She was taken to the OR on 4/1 for cone biopsy and D&C.  Pathology returned showing no atypia, dysplasia or malignancy of the cervical cone biopsy but her endometrial biopsy showed FIGO grade 1 endometrioid adenocarcinoma.  Patient reports today that she had several days of light bleeding which prompted her to call her primary care provider who referred her to a gynecologist.  Her bleeding stopped 3 or 4 days ago.  She has had several episodes of light spotting.  She denies any associated pelvic pain or cramping.  She reports having a good appetite without nausea or emesis.  She reports normal bowel and bladder function.  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  Patient is currently on a RAS blockers.  She follows with a nephrologist at [facility] (Dr. Theador Hawthorne).  She has had 2 grafts placed in the past, neither of which had to be used, both which have clotted off.  She has never been on dialysis.  Her most recent creatinine on 3/12 was 3.63.  This looks to  be about her baseline over the last 9 months.  She also sees an oncologist in the setting of her anemia due to chronic renal disease at [place]. She is on Aranesp q 4 weeks for hemoglobin less than 10.    Patient denies any shortness of breath or chest pain at rest or with ambulation.  She is currently on disability secondary to her kidney disease.  PAST MEDICAL HISTORY:  Past Medical History:  Diagnosis Date  . Absolute anemia 11/10/2015  . Anemia   . Anemia of renal disease 11/10/2015  . Asthma    has Albuterol inhaler prn  . CHF (congestive heart failure) ([place])    acute systolic CHF 07/8248 [facility])  . Chronic kidney disease    Membranous nephropathy ( Seen at [facility])  . Endometrial adenocarcinoma ([place])   . Gastric ulcer   . History of blood transfusion    no abnormal reaction noted  . History of bronchitis    last time 3-64yrs ago  . Hyperlipidemia    takes Pravastatin daily  . Hypertension    takes Imdur and Hydralazine daily as well as Coreg  . Hypothyroidism    takes Synthroid daily  . Itching    on legs-has a Kenalog cream  . Metabolic bone disease   . Nausea    takes Zofran prn  . Nonischemic cardiomyopathy ([place])    12/2011 normal coronaries, EF 22% (EF normal 04/2013)  . Obesity   . Peripheral edema    takes Torsemide daily  . Thyroid disease    hypothyroidism     PAST SURGICAL HISTORY:  Past Surgical History:  Procedure Laterality Date  . AV FISTULA PLACEMENT Left 07/22/2012   Procedure: INSERTION OF ARTERIOVENOUS GORE-TEX GRAFT ARM;  Surgeon: Angelia Mould, MD;  Location: [place];  Service: Vascular;  Laterality: Left;  . AV FISTULA PLACEMENT Right 05/24/2017   Procedure: INSERTION OF ARTERIOVENOUS (AV) GORE-TEX GRAFT RIGHT ARM;  Surgeon: Angelia Mould, MD;  Location: [place];  Service: Vascular;  Laterality: Right;  . CESAREAN SECTION     30+yrs ago  . COLONOSCOPY    . DILATION AND CURETTAGE, DIAGNOSTIC / THERAPEUTIC    . left arm surgery  with pin     15+yrs ago  . TUBAL LIGATION      OB/GYN HISTORY:  OB History  Gravida Para Term Preterm AB Living  2 2          SAB TAB Ectopic Multiple Live Births               # Outcome Date GA Lbr Len/2nd Weight Sex Delivery Anes PTL Lv  2 Para           1 Para             No LMP recorded. Patient is postmenopausal.  Age at menarche: 42 Age at menopause: 55 Hx of HRT: Denies Hx of STDs: Denies Last pap: 07/15/2019-high-grade squamous intraepithelial lesion History of abnormal pap smears: yes  SCREENING STUDIES:  Last mammogram: January  Last colonoscopy: Less than 10 years ago Last bone mineral density: Scheduled for tomorrow  MEDICATIONS: Outpatient Encounter Medications as of 08/20/2019  Medication Sig  . acetaminophen (TYLENOL) 500 MG tablet Take 1,000 mg by mouth daily as needed for moderate pain or headache.  . albuterol (PROVENTIL HFA;VENTOLIN HFA) 108 (90 BASE) MCG/ACT inhaler Inhale 1-2 puffs into the lungs every 6 (six) hours as needed for wheezing.   Marland Kitchen augmented betamethasone dipropionate (DIPROLENE-AF) 0.05 % cream APPLY CREAM TOPICALLY TWICE DAILY TO BOTTOMS OF FEET (PLANTER) AS NEEDED  . carvedilol (COREG) 25 MG tablet Take 25 mg by mouth 2 (two) times daily.   . clobetasol cream (TEMOVATE) 4.40 % Apply 1 application topically daily as needed (rash).   . hydrALAZINE (APRESOLINE) 50 MG tablet Take 50 mg by mouth every 8 (eight) hours.  . isosorbide mononitrate (IMDUR) 30 MG 24 hr tablet Take 30 mg by mouth daily.  . metolazone (ZAROXOLYN) 5 MG tablet Take 5 mg by mouth 2 (two) times daily as needed (swelling).   . pravastatin (PRAVACHOL) 80 MG tablet Take 80 mg by mouth daily.  . raloxifene (EVISTA) 60 MG tablet Take 60 mg by mouth daily.  Marland Kitchen RAYALDEE 30 MCG CPCR Take 30 mcg by mouth at bedtime.   . sodium bicarbonate 650 MG tablet Take 1,300 mg by mouth 3 (three) times daily.   Marland Kitchen torsemide (DEMADEX) 20 MG tablet Take 20 mg by mouth daily as needed (swelling).     No facility-administered encounter medications on file as of 08/20/2019.    ALLERGIES:  Allergies  Allergen Reactions  . Corticosteroids Hives, Itching, Nausea And Vomiting and Rash    Tolerated dexamethasone when being treated for a reaction to rituximab  . Other Hives, Itching, Nausea And Vomiting and Rash    Tolerated dexamethasone when being treated for a reaction to rituximab Tolerated dexamethasone when being treated for a reaction to rituximab  Tolerated dexamethasone when being treated for a reaction to rituximab  . Rituximab Shortness Of Breath    wheezing wheezing      FAMILY HISTORY:  Family History  Problem Relation Age of Onset  . Kidney disease Other   . Diabetes Mother   . Breast cancer Neg Hx   . Colon cancer Neg Hx   . Uterine cancer Neg Hx   . Ovarian cancer Neg Hx      SOCIAL HISTORY:    Social Connections:   . Frequency of Communication with Friends and Family:   . Frequency of Social Gatherings with Friends and Family:   . Attends Religious Services:   . Active Member of Clubs or Organizations:   . Attends Archivist Meetings:   Marland Kitchen Marital Status:     REVIEW OF SYSTEMS:  Denies appetite changes, fevers, chills, fatigue, unexplained weight changes. Denies hearing loss, neck lumps or masses, mouth sores, ringing in ears or voice changes. Denies cough or wheezing.  Denies shortness of breath. Denies chest pain or palpitations. Denies leg swelling. Denies abdominal distention, pain, blood in stools, constipation, diarrhea, nausea, vomiting, or early satiety. Denies pain with intercourse, dysuria, frequency, hematuria or incontinence. Denies hot flashes, pelvic pain, vaginal bleeding or vaginal discharge.   Denies joint pain, back pain or muscle pain/cramps. Denies itching, rash, or wounds. Denies dizziness, headaches, numbness or seizures. Denies swollen lymph nodes or glands, denies easy bruising or bleeding. Denies anxiety,  depression, confusion, or decreased concentration.  Physical Exam:  Vital Signs for this encounter:  Blood pressure (!) 112/51, pulse 62, temperature 99.1 F (37.3 C), temperature source Temporal, resp. rate 18, height 5\' 3"  (1.6 m), weight 233 lb 6 oz (105.9 kg), SpO2 100 %. Body mass index is 41.34 kg/m. General: Alert, oriented, no acute distress.  HEENT: Normocephalic, atraumatic. Sclera anicteric.  Chest: Clear to auscultation bilaterally. No wheezes, rhonchi, or rales. Cardiovascular: Regular rate and rhythm, no murmurs, rubs, or gallops.  Abdomen: Obese. Normoactive bowel sounds. Soft, nondistended, nontender to palpation. No masses or hepatosplenomegaly appreciated. No palpable fluid wave.  Well-healed Pfannenstiel incision, and a periumbilical incision. Extremities: Grossly normal range of motion. Warm, well perfused. No edema bilaterally.  Skin: No rashes.  Patient has what looks like small (less than 1 cm) excoriation on the anterior side of both lower legs as well as her back.  She sees dermatology for this. Lymphatics: No cervical, supraclavicular, or inguinal adenopathy.  GU:  Normal external female genitalia.  No lesions. No discharge or bleeding.             Bladder/urethra:  No lesions or masses, well supported bladder             Vagina: Mildly atrophic vaginal mucosa.             Cervix: No masses appreciated.  Suture still in place from cold knife cone biopsy.  No nodularity noted on bimanual exam.             Uterus: Small, mobile, no parametrial involvement or nodularity.             Adnexa: No masses.  Rectal: No nodularity.  LABORATORY AND RADIOLOGIC DATA:  Outside medical records were reviewed to synthesize the above history, along with the history and physical obtained during the visit.   Lab Results  Component Value Date   WBC 7.3 07/24/2019   HGB 10.6 (L) 07/24/2019   HCT 33.9 (L) 07/24/2019   PLT 206 07/24/2019   GLUCOSE 106 (H) 07/24/2019   ALT 12  07/24/2019   AST 13 (L) 07/24/2019   NA 139 07/24/2019   K 3.9 07/24/2019  CL 108 07/24/2019   CREATININE 3.63 (H) 07/24/2019   BUN 28 (H) 07/24/2019   CO2 22 07/24/2019   Pathology from 3/25: Cervical cone-benign endocervical transformation zone mucosa, no atypia, dysplasia or malignancy. Endometrial biopsy-endometrioid adenocarcinoma, FIGO grade 1.  Pelvic ultrasound on 3/11: Uterus measures 5.9 x 3.2 x 3.4 cm, no fibroids or masses visualized.  Endometrial lining measures 9 mm.  Neither ovary visualized, no abnormal free fluid.

## 2019-08-20 ENCOUNTER — Other Ambulatory Visit: Payer: Self-pay

## 2019-08-20 ENCOUNTER — Inpatient Hospital Stay: Payer: Medicare Other | Attending: Gynecologic Oncology | Admitting: Gynecologic Oncology

## 2019-08-20 ENCOUNTER — Encounter: Payer: Self-pay | Admitting: Gynecologic Oncology

## 2019-08-20 VITALS — BP 112/51 | HR 62 | Temp 99.1°F | Resp 18 | Ht 63.0 in | Wt 233.4 lb

## 2019-08-20 DIAGNOSIS — I130 Hypertensive heart and chronic kidney disease with heart failure and stage 1 through stage 4 chronic kidney disease, or unspecified chronic kidney disease: Secondary | ICD-10-CM | POA: Diagnosis not present

## 2019-08-20 DIAGNOSIS — D631 Anemia in chronic kidney disease: Secondary | ICD-10-CM | POA: Insufficient documentation

## 2019-08-20 DIAGNOSIS — N185 Chronic kidney disease, stage 5: Secondary | ICD-10-CM | POA: Insufficient documentation

## 2019-08-20 DIAGNOSIS — Z79890 Hormone replacement therapy (postmenopausal): Secondary | ICD-10-CM | POA: Insufficient documentation

## 2019-08-20 DIAGNOSIS — Z79899 Other long term (current) drug therapy: Secondary | ICD-10-CM | POA: Diagnosis not present

## 2019-08-20 DIAGNOSIS — C541 Malignant neoplasm of endometrium: Secondary | ICD-10-CM | POA: Diagnosis not present

## 2019-08-20 DIAGNOSIS — E039 Hypothyroidism, unspecified: Secondary | ICD-10-CM | POA: Diagnosis not present

## 2019-08-20 DIAGNOSIS — E785 Hyperlipidemia, unspecified: Secondary | ICD-10-CM | POA: Insufficient documentation

## 2019-08-20 DIAGNOSIS — I509 Heart failure, unspecified: Secondary | ICD-10-CM | POA: Insufficient documentation

## 2019-08-20 NOTE — Patient Instructions (Signed)
We will reach out to your physicians to work on obtaining clearance to proceed with surgery. If they feel it is too high risk, then we will arrange for you to have a dilation and curettage of the uterus with intrauterine device (IUD) placement.  Preparing for your Surgery  Plan for surgery on Sep 24, 2019 with Dr. Jeral Pinch at [place] will be scheduled for a robotic assisted total laparoscopic hysterectomy, bilateral salpingo-oophorectomy, sentinel lymph node biopsy, possible lymph node dissection, possible laparotomy.   Pre-operative Testing -You will receive a phone call from presurgical testing at [facility] to arrange for a pre-operative appointment over the phone, lab appointment, and COVID test prior to surgery. The COVID test normally happens three days before surgery and then they ask that you quarantine to decrease chance of exposure.  -Bring your insurance card, copy of an advanced directive if applicable, medication list  -At that visit, you will be asked to sign a consent for a possible blood transfusion in case a transfusion becomes necessary during surgery.  The need for a blood transfusion is rare but having consent is a necessary part of your care.     -You should not be taking blood thinners or aspirin at least ten days prior to surgery unless instructed by your surgeon.  -Do not take supplements such as fish oil (omega 3), red yeast rice, tumeric before your surgery.   Day Before Surgery at [place] will be asked to take in a light diet the day before surgery.  Avoid carbonated beverages.  You will be advised to have nothing to eat or drink after midnight the evening before.    Eat a light diet the day before surgery.  Examples including soups, broths, toast, yogurt, mashed potatoes.  Things to avoid include carbonated beverages (fizzy beverages), raw fruits and raw vegetables, or beans.   If your bowels are filled with gas, your surgeon  will have difficulty visualizing your pelvic organs which increases your surgical risks.  Your role in recovery Your role is to become active as soon as directed by your doctor, while still giving yourself time to heal.  Rest when you feel tired. You will be asked to do the following in order to speed your recovery:  - Cough and breathe deeply. This helps to clear and expand your lungs and can prevent pneumonia after surgery.  - [place]. Do mild physical activity. Walking or moving your legs help your circulation and body functions return to normal. Do not try to get up or walk alone the first time after surgery.   -If you develop swelling on one leg or the other, pain in the back of your leg, redness/warmth in one of your legs, please call the office or go to the Emergency Room to have a doppler to rule out a blood clot. For shortness of breath, chest pain-seek care in the Emergency Room as soon as possible. - Actively manage your pain. Managing your pain lets you move in comfort. We will ask you to rate your pain on a scale of zero to 10. It is your responsibility to tell your doctor or nurse where and how much you hurt so your pain can be treated.  Special Considerations -If you are diabetic, you may be placed on insulin after surgery to have closer control over your blood sugars to promote healing and recovery.  This does not mean that you will be discharged on  insulin.  If applicable, your oral antidiabetics will be resumed when you are tolerating a solid diet.  -Your final pathology results from surgery should be available around one week after surgery and the results will be relayed to you when available.  -Dr. Lahoma Crocker is the surgeon that assists your GYN Oncologist with surgery.  If you end up staying the night, the next day after your surgery you will either see Dr. Denman George, Dr. Berline Lopes, or Dr. Lahoma Crocker.  -FMLA forms can be faxed to [phone] and  please allow 5-7 business days for completion.  Pain Management After Surgery -Once we have determined if surgery is safe for you, we will prescribe your pain medication and bowel regimen medications before surgery so that you can have these available when you are discharged from the hospital. The pain medication is for use ONLY AFTER surgery and a new prescription will not be given.   -Make sure that you have Tylenol at home to use on a regular basis after surgery for pain control.   Bowel Regimen -You will be prescribed Sennakot-S to take nightly to prevent constipation especially if you are taking the narcotic pain medication intermittently.  It is important to prevent constipation and drink adequate amounts of liquids. You can stop taking this medication when you are not taking pain medication and you are back on your normal bowel routine.   Blood Transfusion Information (For the consent to be signed before surgery)  We will be checking your blood type before surgery so in case of emergencies, we will know what type of blood you would need.                                            WHAT IS A BLOOD TRANSFUSION?  A transfusion is the replacement of blood or some of its parts. Blood is made up of multiple cells which provide different functions.  Red blood cells carry oxygen and are used for blood loss replacement.  White blood cells fight against infection.  Platelets control bleeding.  Plasma helps clot blood.  Other blood products are available for specialized needs, such as hemophilia or other clotting disorders. BEFORE THE TRANSFUSION  Who gives blood for transfusions?   You may be able to donate blood to be used at a later date on yourself (autologous donation).  Relatives can be asked to donate blood. This is generally not any safer than if you have received blood from a stranger. The same precautions are taken to ensure safety when a relative's blood is donated.  Healthy  volunteers who are fully evaluated to make sure their blood is safe. This is blood bank blood. Transfusion therapy is the safest it has ever been in the practice of medicine. Before blood is taken from a donor, a complete history is taken to make sure that person has no history of diseases nor engages in risky social behavior (examples are intravenous drug use or sexual activity with multiple partners). The donor's travel history is screened to minimize risk of transmitting infections, such as malaria. The donated blood is tested for signs of infectious diseases, such as HIV and hepatitis. The blood is then tested to be sure it is compatible with you in order to minimize the chance of a transfusion reaction. If you or a relative donates blood, this is often done in anticipation of surgery  and is not appropriate for emergency situations. It takes many days to process the donated blood. RISKS AND COMPLICATIONS Although transfusion therapy is very safe and saves many lives, the main dangers of transfusion include:   Getting an infectious disease.  Developing a transfusion reaction. This is an allergic reaction to something in the blood you were given. Every precaution is taken to prevent this. The decision to have a blood transfusion has been considered carefully by your caregiver before blood is given. Blood is not given unless the benefits outweigh the risks.  AFTER SURGERY INSTRUCTIONS  Return to work: 4-6 weeks if applicable  Activity: 1. Be up and out of the bed during the day.  Take a nap if needed.  You may walk up steps but be careful and use the hand rail.  Stair climbing will tire you more than you think, you may need to stop part way and rest.   2. No lifting or straining for 6 weeks over 10 pounds. No pushing, pulling, straining for 6 weeks.  3. No driving for 1 week(s) minimum if you were cleared to drive before surgery.  Do not drive if you are taking narcotic pain medicine.   4. You  can shower as soon as the next day after surgery. Shower daily.  Use soap and water on your incision and pat dry; don't rub.  No tub baths or submerging your body in water until cleared by your surgeon. If you have the soap that was given to you by pre-surgical testing that was used before surgery, you do not need to use it afterwards because this can irritate your incisions.   5. No sexual activity and nothing in the vagina for 8 weeks.  6. You may experience a small amount of clear drainage from your incisions, which is normal.  If the drainage persists, increases, or changes color please call the office.  7. Do not use creams, lotions, or ointments such as neosporin on your incisions after surgery until advised by your surgeon because they can cause removal of the dermabond glue on your incisions.    8. You may experience vaginal spotting after surgery or around the 6-8 week mark from surgery when the stitches at the top of the vagina begin to dissolve.  The spotting is normal but if you experience heavy bleeding, call our office.  9. Take Tylenol first for pain and only use narcotic pain medication for severe pain not relieved by the Tylenol.  Monitor your Tylenol intake to a max of 4,000 mg.  Diet: 1. Low sodium Heart Healthy Diet is recommended.  2. It is safe to use a laxative, such as Miralax or Colace, if you have difficulty moving your bowels. You can take Sennakot at bedtime every evening to keep bowel movements regular and to prevent constipation.    Wound Care: 1. Keep clean and dry.  Shower daily.  Reasons to call the Doctor:  Fever - Oral temperature greater than 100.4 degrees Fahrenheit  Foul-smelling vaginal discharge  Difficulty urinating  Nausea and vomiting  Increased pain at the site of the incision that is unrelieved with pain medicine.  Difficulty breathing with or without chest pain  New calf pain especially if only on one side  Sudden, continuing increased  vaginal bleeding with or without clots.   Contacts: For questions or concerns you should contact:  Dr. Jeral Pinch at [phone]  Joylene John, NP at [phone]  After Hours: call [phone] and have the GYN  Oncologist paged/contacted

## 2019-08-21 ENCOUNTER — Telehealth: Payer: Self-pay | Admitting: *Deleted

## 2019-08-21 NOTE — Telephone Encounter (Signed)
Fax letter per Lenna Sciara APP to both her PCP and nephrology

## 2019-08-21 NOTE — Telephone Encounter (Signed)
Fax office note and clearance form to both her PCP and nephrologist

## 2019-08-21 NOTE — Telephone Encounter (Signed)
Error

## 2019-08-26 DIAGNOSIS — I10 Essential (primary) hypertension: Secondary | ICD-10-CM | POA: Diagnosis not present

## 2019-08-26 DIAGNOSIS — Z299 Encounter for prophylactic measures, unspecified: Secondary | ICD-10-CM | POA: Diagnosis not present

## 2019-08-26 DIAGNOSIS — N185 Chronic kidney disease, stage 5: Secondary | ICD-10-CM | POA: Diagnosis not present

## 2019-09-01 ENCOUNTER — Telehealth: Payer: Self-pay | Admitting: *Deleted

## 2019-09-01 ENCOUNTER — Ambulatory Visit: Payer: Medicare Other | Attending: Internal Medicine

## 2019-09-01 DIAGNOSIS — Z23 Encounter for immunization: Secondary | ICD-10-CM

## 2019-09-01 NOTE — Telephone Encounter (Signed)
Received fax from patient's PCP for surgical clearance. One copy sent to be scanned, one fax to pre admission and one in the chart

## 2019-09-01 NOTE — Progress Notes (Signed)
   Covid-19 Vaccination Clinic  Name:  Shannon [name]    MRN: [id number] DOB: 1956/04/22  09/01/2019  [name] was observed post Covid-19 immunization for 15 minutes without incident. She was provided with Vaccine Information Sheet and instruction to access the V-Safe system.   [name] was instructed to call 911 with any severe reactions post vaccine: Marland Kitchen Difficulty breathing  . Swelling of face and throat  . A fast heartbeat  . A bad rash all over body  . Dizziness and weakness   Immunizations Administered    Name Date Dose VIS Date Route   Moderna COVID-19 Vaccine 09/01/2019  9:20 AM 0.5 mL 04/2019 Intramuscular   Manufacturer: Moderna   Lot: [id number]   [place]: [id number]

## 2019-09-01 NOTE — Telephone Encounter (Signed)
Received clearance from nephrology. One copy sent to be scanned, one fax to pre admission and one in the chart

## 2019-09-09 DIAGNOSIS — I10 Essential (primary) hypertension: Secondary | ICD-10-CM | POA: Diagnosis not present

## 2019-09-09 DIAGNOSIS — E7800 Pure hypercholesterolemia, unspecified: Secondary | ICD-10-CM | POA: Diagnosis not present

## 2019-09-09 DIAGNOSIS — I509 Heart failure, unspecified: Secondary | ICD-10-CM | POA: Diagnosis not present

## 2019-09-14 NOTE — Patient Instructions (Addendum)
DUE TO COVID-19 ONLY ONE VISITOR IS ALLOWED TO COME WITH YOU AND STAY IN THE WAITING ROOM ONLY DURING PRE OP AND PROCEDURE DAY OF SURGERY. THE 1 VISITOR MAY VISIT WITH YOU AFTER SURGERY IN YOUR PRIVATE ROOM DURING VISITING HOURS ONLY!  YOU NEED TO HAVE A COVID 19 TEST ON 09-21-19 @12 :55 PM.THIS TEST MUST BE DONE BEFORE SURGERY, COME  TO [place], [place] TEST IS COMPLETED, PLEASE BEGIN THE QUARANTINE INSTRUCTIONS AS OUTLINED IN YOUR HANDOUT.                Shannon [name]  09/14/2019   Your procedure is scheduled on: 09-24-19   Report to [facility] Main  Entrance    Report to Admitting at 10:30 AM     Call this number if you have problems the morning of surgery [phone]    Eat a light diet the day before surgery.  Examples including soups, broths, toast, yogurt, mashed potatoes.  Things to avoid include carbonated beverages (fizzy beverages), raw fruits and raw vegetables, or beans.   If your bowels are filled with gas, your surgeon will have difficulty visualizing your pelvic organs which increases your surgical risk   Remember:After Midnight, you may have a Clear Liquid Diet until 9:30 AM. After 9:30 AM, nothing until after surgery.     CLEAR LIQUID DIET   Foods Allowed                                                                     Foods Excluded  Coffee and tea, regular and decaf                             liquids that you cannot  Plain Jell-O any favor except red or purple                                           see through such as: Fruit ices (not with fruit pulp)                                     milk, soups, orange juice  Iced Popsicles                                    All solid food                                  Cranberry, grape and apple juices Sports drinks like Gatorade Lightly seasoned clear broth or consume(fat free) Sugar, honey  syrup   _____________________________________________________________________     Take these medicines the morning of surgery with A SIP OF WATER: Carvedilol (Coreg), Hydralazine (Apresoline), Isosorbide Mononitrat, and Pravastatin. You may also use your inhaler  BRUSH YOUR TEETH MORNING OF SURGERY AND RINSE YOUR MOUTH OUT, NO CHEWING GUM CANDY OR MINTS.  You may not have any metal on your body including hair pins and              piercings     Do not wear jewelry, make-up, lotions, powders or perfumes, deodorant              Do not wear nail polish on your fingernails.  Do not shave  48 hours prior to surgery.                 Do not bring valuables to the hospital. [place].  Contacts, dentures or bridgework may not be worn into surgery.  You may bring a small overnight bag      Special Instructions: N/A              Please read over the following fact sheets you were given: _____________________________________________________________________             [facility] - Preparing for Surgery Before surgery, you can play an important role.  Because skin is not sterile, your skin needs to be as free of germs as possible.  You can reduce the number of germs on your skin by washing with CHG (chlorahexidine gluconate) soap before surgery.  CHG is an antiseptic cleaner which kills germs and bonds with the skin to continue killing germs even after washing. Please DO NOT use if you have an allergy to CHG or antibacterial soaps.  If your skin becomes reddened/irritated stop using the CHG and inform your nurse when you arrive at Short Stay. Do not shave (including legs and underarms) for at least 48 hours prior to the first CHG shower.  You may shave your face/neck. Please follow these instructions carefully:  1.  Shower with CHG Soap the night before surgery and the  morning of Surgery.  2.  If you choose  to wash your hair, wash your hair first as usual with your  normal  shampoo.  3.  After you shampoo, rinse your hair and body thoroughly to remove the  shampoo.                           4.  Use CHG as you would any other liquid soap.  You can apply chg directly  to the skin and wash                       Gently with a scrungie or clean washcloth.  5.  Apply the CHG Soap to your body ONLY FROM THE NECK DOWN.   Do not use on face/ open                           Wound or open sores. Avoid contact with eyes, ears mouth and genitals (private parts).                       Wash face,  Genitals (private parts) with your normal soap.             6.  Wash thoroughly, paying special attention to the area where your surgery  will be performed.  7.  Thoroughly rinse your body with warm water from the neck down.  8.  DO NOT shower/wash with your normal soap after using and  rinsing off  the CHG Soap.                9.  Pat yourself dry with a clean towel.            10.  Wear clean pajamas.            11.  Place clean sheets on your bed the night of your first shower and do not  sleep with pets. Day of Surgery : Do not apply any lotions/deodorants the morning of surgery.  Please wear clean clothes to the hospital/surgery center.  FAILURE TO FOLLOW THESE INSTRUCTIONS MAY RESULT IN THE CANCELLATION OF YOUR SURGERY PATIENT SIGNATURE_________________________________  NURSE SIGNATURE__________________________________  ________________________________________________________________________   Adam Phenix  An incentive spirometer is a tool that can help keep your lungs clear and active. This tool measures how well you are filling your lungs with each breath. Taking long deep breaths may help reverse or decrease the chance of developing breathing (pulmonary) problems (especially infection) following:  A long period of time when you are unable to move or be active. BEFORE THE PROCEDURE   If the  spirometer includes an indicator to show your best effort, your nurse or respiratory therapist will set it to a desired goal.  If possible, sit up straight or lean slightly forward. Try not to slouch.  Hold the incentive spirometer in an upright position. INSTRUCTIONS FOR USE  1. Sit on the edge of your bed if possible, or sit up as far as you can in bed or on a chair. 2. Hold the incentive spirometer in an upright position. 3. Breathe out normally. 4. Place the mouthpiece in your mouth and seal your lips tightly around it. 5. Breathe in slowly and as deeply as possible, raising the piston or the ball toward the top of the column. 6. Hold your breath for 3-5 seconds or for as long as possible. Allow the piston or ball to fall to the bottom of the column. 7. Remove the mouthpiece from your mouth and breathe out normally. 8. Rest for a few seconds and repeat Steps 1 through 7 at least 10 times every 1-2 hours when you are awake. Take your time and take a few normal breaths between deep breaths. 9. The spirometer may include an indicator to show your best effort. Use the indicator as a goal to work toward during each repetition. 10. After each set of 10 deep breaths, practice coughing to be sure your lungs are clear. If you have an incision (the cut made at the time of surgery), support your incision when coughing by placing a pillow or rolled up towels firmly against it. Once you are able to get out of bed, walk around indoors and cough well. You may stop using the incentive spirometer when instructed by your caregiver.  RISKS AND COMPLICATIONS  Take your time so you do not get dizzy or light-headed.  If you are in pain, you may need to take or ask for pain medication before doing incentive spirometry. It is harder to take a deep breath if you are having pain. AFTER USE  Rest and breathe slowly and easily.  It can be helpful to keep track of a log of your progress. Your caregiver can provide  you with a simple table to help with this. If you are using the spirometer at home, follow these instructions: Shannon [name] IF:   You are having difficultly using the spirometer.  You have trouble using  the spirometer as often as instructed.  Your pain medication is not giving enough relief while using the spirometer.  You develop fever of 100.5 F (38.1 C) or higher. SEEK IMMEDIATE MEDICAL CARE IF:   You cough up bloody sputum that had not been present before.  You develop fever of 102 F (38.9 C) or greater.  You develop worsening pain at or near the incision site. MAKE SURE YOU:   Understand these instructions.  Will watch your condition.  Will get help right away if you are not doing well or get worse. Document Released: 09/10/2006 Document Revised: 07/23/2011 Document Reviewed: 11/11/2006 Exit[employer] Patient Information 2014 [employer], [place].   ________________________________________________________________________  WHAT IS A BLOOD TRANSFUSION? Blood Transfusion Information  A transfusion is the replacement of blood or some of its parts. Blood is made up of multiple cells which provide different functions.  Red blood cells carry oxygen and are used for blood loss replacement.  White blood cells fight against infection.  Platelets control bleeding.  Plasma helps clot blood.  Other blood products are available for specialized needs, such as hemophilia or other clotting disorders. BEFORE THE TRANSFUSION  Who gives blood for transfusions?   Healthy volunteers who are fully evaluated to make sure their blood is safe. This is blood bank blood. Transfusion therapy is the safest it has ever been in the practice of medicine. Before blood is taken from a donor, a complete history is taken to make sure that person has no history of diseases nor engages in risky social behavior (examples are intravenous drug use or sexual activity with multiple partners). The donor's  travel history is screened to minimize risk of transmitting infections, such as malaria. The donated blood is tested for signs of infectious diseases, such as HIV and hepatitis. The blood is then tested to be sure it is compatible with you in order to minimize the chance of a transfusion reaction. If you or a relative donates blood, this is often done in anticipation of surgery and is not appropriate for emergency situations. It takes many days to process the donated blood. RISKS AND COMPLICATIONS Although transfusion therapy is very safe and saves many lives, the main dangers of transfusion include:   Getting an infectious disease.  Developing a transfusion reaction. This is an allergic reaction to something in the blood you were given. Every precaution is taken to prevent this. The decision to have a blood transfusion has been considered carefully by your caregiver before blood is given. Blood is not given unless the benefits outweigh the risks. AFTER THE TRANSFUSION  Right after receiving a blood transfusion, you will usually feel much better and more energetic. This is especially true if your red blood cells have gotten low (anemic). The transfusion raises the level of the red blood cells which carry oxygen, and this usually causes an energy increase.  The nurse administering the transfusion will monitor you carefully for complications. HOME CARE INSTRUCTIONS  No special instructions are needed after a transfusion. You may find your energy is better. Speak with your caregiver about any limitations on activity for underlying diseases you may have. SEEK MEDICAL CARE IF:   Your condition is not improving after your transfusion.  You develop redness or irritation at the intravenous (IV) site. SEEK IMMEDIATE MEDICAL CARE IF:  Any of the following symptoms occur over the next 12 hours:  Shaking chills.  You have a temperature by mouth above 102 F (38.9 C), not controlled by  medicine.  Chest, back, or muscle pain.  People around you feel you are not acting correctly or are confused.  Shortness of breath or difficulty breathing.  Dizziness and fainting.  You get a rash or develop hives.  You have a decrease in urine output.  Your urine turns a dark color or changes to pink, red, or brown. Any of the following symptoms occur over the next 10 days:  You have a temperature by mouth above 102 F (38.9 C), not controlled by medicine.  Shortness of breath.  Weakness after normal activity.  The white part of the eye turns yellow (jaundice).  You have a decrease in the amount of urine or are urinating less often.  Your urine turns a dark color or changes to pink, red, or brown. Document Released: 04/27/2000 Document Revised: 07/23/2011 Document Reviewed: 12/15/2007 [facility] Patient Information 2014 [place], [place].  _______________________________________________________________________

## 2019-09-14 NOTE — Progress Notes (Addendum)
PCP - Jerene Bears, MD  Cardiologist - Pt cannot recall  name of the Cardiologist seen in the past.  Stated "last saw a couple of years ago, he was in [place]"  Clearance from nephrologist dated 08-26-19   Chest x-ray -  EKG -  Stress Test -  ECHO -  Cardiac Cath -   Sleep Study -  CPAP -   Fasting Blood Sugar -  Checks Blood Sugar _____ times a day  Blood Thinner Instructions: Aspirin Instructions: Last Dose:  Anesthesia review: Hx of CHF, CKD, HTN  Patient denies shortness of breath, fever, cough and chest pain at PAT appointment   Patient verbalized understanding of instructions that were given to them at the PAT appointment. Patient was also instructed that they will need to review over the PAT instructions again at home before surgery.

## 2019-09-15 ENCOUNTER — Other Ambulatory Visit: Payer: Self-pay

## 2019-09-15 ENCOUNTER — Encounter (HOSPITAL_COMMUNITY): Payer: Self-pay

## 2019-09-15 ENCOUNTER — Encounter (HOSPITAL_COMMUNITY)
Admission: RE | Admit: 2019-09-15 | Discharge: 2019-09-15 | Disposition: A | Discharge status: Home / Self Care | Payer: Medicare Other | Source: Ambulatory Visit | Attending: Gynecologic Oncology | Admitting: Gynecologic Oncology

## 2019-09-15 DIAGNOSIS — Z01818 Encounter for other preprocedural examination: Secondary | ICD-10-CM | POA: Insufficient documentation

## 2019-09-15 LAB — URINALYSIS, ROUTINE W REFLEX MICROSCOPIC
Bacteria, UA: NONE SEEN
Bilirubin Urine: NEGATIVE
Glucose, UA: 50 mg/dL — AB
Hgb urine dipstick: NEGATIVE
Ketones, ur: NEGATIVE mg/dL
Leukocytes,Ua: NEGATIVE
Nitrite: NEGATIVE
Protein, ur: 30 mg/dL — AB
Specific Gravity, Urine: 1.011 (ref 1.005–1.030)
pH: 6 (ref 5.0–8.0)

## 2019-09-15 LAB — COMPREHENSIVE METABOLIC PANEL
ALT: 10 U/L (ref 0–44)
AST: 14 U/L — ABNORMAL LOW (ref 15–41)
Albumin: 3.6 g/dL (ref 3.5–5.0)
Alkaline Phosphatase: 85 U/L (ref 38–126)
Anion gap: 9 (ref 5–15)
BUN: 38 mg/dL — ABNORMAL HIGH (ref 8–23)
CO2: 23 mmol/L (ref 22–32)
Calcium: 8.7 mg/dL — ABNORMAL LOW (ref 8.9–10.3)
Chloride: 112 mmol/L — ABNORMAL HIGH (ref 98–111)
Creatinine, Ser: 3.82 mg/dL — ABNORMAL HIGH (ref 0.44–1.00)
GFR calc Af Amer: 14 mL/min — ABNORMAL LOW (ref >60)
GFR calc non Af Amer: 12 mL/min — ABNORMAL LOW (ref >60)
Glucose, Bld: 92 mg/dL (ref 70–99)
Potassium: 4.2 mmol/L (ref 3.5–5.1)
Sodium: 144 mmol/L (ref 135–145)
Total Bilirubin: 0.2 mg/dL — ABNORMAL LOW (ref 0.3–1.2)
Total Protein: 6.8 g/dL (ref 6.5–8.1)

## 2019-09-15 LAB — CBC
HCT: 33.9 % — ABNORMAL LOW (ref 36.0–46.0)
Hemoglobin: 10.4 g/dL — ABNORMAL LOW (ref 12.0–15.0)
MCH: 31.7 pg (ref 26.0–34.0)
MCHC: 30.7 g/dL (ref 30.0–36.0)
MCV: 103.4 fL — ABNORMAL HIGH (ref 80.0–100.0)
Platelets: 221 10*3/uL (ref 150–400)
RBC: 3.28 MIL/uL — ABNORMAL LOW (ref 3.87–5.11)
RDW: 13.2 % (ref 11.5–15.5)
WBC: 10.7 10*3/uL — ABNORMAL HIGH (ref 4.0–10.5)
nRBC: 0 % (ref 0.0–0.2)

## 2019-09-15 NOTE — Progress Notes (Signed)
CBC, CMP, UA results routed to Dr. Charisse March office for review

## 2019-09-16 LAB — ABO/RH: ABO/RH(D): O POS

## 2019-09-21 ENCOUNTER — Other Ambulatory Visit: Payer: Self-pay

## 2019-09-21 ENCOUNTER — Other Ambulatory Visit (HOSPITAL_COMMUNITY)
Admission: RE | Admit: 2019-09-21 | Discharge: 2019-09-21 | Disposition: A | Discharge status: Home / Self Care | Payer: Medicare Other | Source: Ambulatory Visit | Attending: Gynecologic Oncology | Admitting: Gynecologic Oncology

## 2019-09-21 DIAGNOSIS — Z20822 Contact with and (suspected) exposure to covid-19: Secondary | ICD-10-CM | POA: Diagnosis not present

## 2019-09-21 DIAGNOSIS — Z01812 Encounter for preprocedural laboratory examination: Secondary | ICD-10-CM | POA: Diagnosis not present

## 2019-09-21 LAB — SARS CORONAVIRUS 2 (TAT 6-24 HRS): SARS Coronavirus 2: NEGATIVE

## 2019-09-23 ENCOUNTER — Telehealth: Payer: Self-pay

## 2019-09-23 NOTE — Telephone Encounter (Signed)
Shannon [name] states that she understands her written pre op instructions for tomorrow. All questions answered to patient's satisfaction.

## 2019-09-24 ENCOUNTER — Other Ambulatory Visit: Payer: Self-pay

## 2019-09-24 ENCOUNTER — Ambulatory Visit (HOSPITAL_COMMUNITY): Payer: Medicare Other | Admitting: Certified Registered Nurse Anesthetist

## 2019-09-24 ENCOUNTER — Encounter (HOSPITAL_COMMUNITY)
Admission: RE | Disposition: A | Discharge status: Home / Self Care | Payer: Self-pay | Source: Ambulatory Visit | Attending: Obstetrics & Gynecology

## 2019-09-24 ENCOUNTER — Encounter (HOSPITAL_COMMUNITY): Payer: Self-pay | Admitting: Gynecologic Oncology

## 2019-09-24 ENCOUNTER — Ambulatory Visit (HOSPITAL_COMMUNITY)
Admission: RE | Admit: 2019-09-24 | Discharge: 2019-09-25 | Disposition: A | Discharge status: Home / Self Care | Payer: Medicare Other | Source: Ambulatory Visit | Attending: Gynecologic Oncology | Admitting: Gynecologic Oncology

## 2019-09-24 DIAGNOSIS — Z888 Allergy status to other drugs, medicaments and biological substances status: Secondary | ICD-10-CM | POA: Insufficient documentation

## 2019-09-24 DIAGNOSIS — I130 Hypertensive heart and chronic kidney disease with heart failure and stage 1 through stage 4 chronic kidney disease, or unspecified chronic kidney disease: Secondary | ICD-10-CM | POA: Diagnosis not present

## 2019-09-24 DIAGNOSIS — E8889 Other specified metabolic disorders: Secondary | ICD-10-CM | POA: Insufficient documentation

## 2019-09-24 DIAGNOSIS — F17200 Nicotine dependence, unspecified, uncomplicated: Secondary | ICD-10-CM | POA: Diagnosis not present

## 2019-09-24 DIAGNOSIS — E669 Obesity, unspecified: Secondary | ICD-10-CM | POA: Diagnosis not present

## 2019-09-24 DIAGNOSIS — K259 Gastric ulcer, unspecified as acute or chronic, without hemorrhage or perforation: Secondary | ICD-10-CM | POA: Insufficient documentation

## 2019-09-24 DIAGNOSIS — C541 Malignant neoplasm of endometrium: Secondary | ICD-10-CM | POA: Diagnosis present

## 2019-09-24 DIAGNOSIS — N888 Other specified noninflammatory disorders of cervix uteri: Secondary | ICD-10-CM | POA: Diagnosis not present

## 2019-09-24 DIAGNOSIS — I132 Hypertensive heart and chronic kidney disease with heart failure and with stage 5 chronic kidney disease, or end stage renal disease: Secondary | ICD-10-CM | POA: Diagnosis not present

## 2019-09-24 DIAGNOSIS — I890 Lymphedema, not elsewhere classified: Secondary | ICD-10-CM | POA: Insufficient documentation

## 2019-09-24 DIAGNOSIS — D631 Anemia in chronic kidney disease: Secondary | ICD-10-CM | POA: Diagnosis not present

## 2019-09-24 DIAGNOSIS — J45909 Unspecified asthma, uncomplicated: Secondary | ICD-10-CM | POA: Diagnosis not present

## 2019-09-24 DIAGNOSIS — E039 Hypothyroidism, unspecified: Secondary | ICD-10-CM | POA: Insufficient documentation

## 2019-09-24 DIAGNOSIS — I428 Other cardiomyopathies: Secondary | ICD-10-CM | POA: Insufficient documentation

## 2019-09-24 DIAGNOSIS — I5021 Acute systolic (congestive) heart failure: Secondary | ICD-10-CM | POA: Insufficient documentation

## 2019-09-24 DIAGNOSIS — N803 Endometriosis of pelvic peritoneum: Secondary | ICD-10-CM | POA: Diagnosis not present

## 2019-09-24 DIAGNOSIS — Z6841 Body Mass Index (BMI) 40.0 and over, adult: Secondary | ICD-10-CM | POA: Diagnosis not present

## 2019-09-24 DIAGNOSIS — E785 Hyperlipidemia, unspecified: Secondary | ICD-10-CM | POA: Diagnosis not present

## 2019-09-24 DIAGNOSIS — Z833 Family history of diabetes mellitus: Secondary | ICD-10-CM | POA: Diagnosis not present

## 2019-09-24 DIAGNOSIS — N186 End stage renal disease: Secondary | ICD-10-CM | POA: Diagnosis not present

## 2019-09-24 DIAGNOSIS — Z841 Family history of disorders of kidney and ureter: Secondary | ICD-10-CM | POA: Diagnosis not present

## 2019-09-24 DIAGNOSIS — I509 Heart failure, unspecified: Secondary | ICD-10-CM | POA: Diagnosis not present

## 2019-09-24 DIAGNOSIS — N184 Chronic kidney disease, stage 4 (severe): Secondary | ICD-10-CM | POA: Insufficient documentation

## 2019-09-24 HISTORY — PX: ROBOTIC ASSISTED TOTAL HYSTERECTOMY WITH BILATERAL SALPINGO OOPHERECTOMY: SHX6086

## 2019-09-24 HISTORY — PX: SENTINEL NODE BIOPSY: SHX6608

## 2019-09-24 LAB — TYPE AND SCREEN
ABO/RH(D): O POS
Antibody Screen: NEGATIVE

## 2019-09-24 SURGERY — HYSTERECTOMY, TOTAL, ROBOT-ASSISTED, LAPAROSCOPIC, WITH BILATERAL SALPINGO-OOPHORECTOMY
Anesthesia: General

## 2019-09-24 MED ORDER — GLYCOPYRROLATE PF 0.2 MG/ML IJ SOSY
PREFILLED_SYRINGE | INTRAMUSCULAR | Status: DC | PRN
Start: 2019-09-24 — End: 2019-09-24
  Administered 2019-09-24: .4 mg via INTRAVENOUS

## 2019-09-24 MED ORDER — BUPIVACAINE HCL 0.25 % IJ SOLN
INTRAMUSCULAR | Status: AC
  Filled 2019-09-24: qty 1

## 2019-09-24 MED ORDER — KETAMINE HCL 10 MG/ML IJ SOLN
INTRAMUSCULAR | Status: DC | PRN
Start: 2019-09-24 — End: 2019-09-24
  Administered 2019-09-24: 25 mg via INTRAVENOUS

## 2019-09-24 MED ORDER — ACETAMINOPHEN 500 MG PO TABS
1000.0000 mg | ORAL_TABLET | ORAL | Status: AC
  Administered 2019-09-24: 1000 mg via ORAL
  Filled 2019-09-24: qty 2

## 2019-09-24 MED ORDER — NEOSTIGMINE METHYLSULFATE 3 MG/3ML IV SOSY
PREFILLED_SYRINGE | INTRAVENOUS | Status: AC
  Filled 2019-09-24: qty 6

## 2019-09-24 MED ORDER — BUPIVACAINE HCL 0.25 % IJ SOLN
INTRAMUSCULAR | Status: DC | PRN
  Administered 2019-09-24: 42 mL

## 2019-09-24 MED ORDER — FENTANYL CITRATE (PF) 100 MCG/2ML IJ SOLN
25.0000 ug | INTRAMUSCULAR | Status: DC | PRN
  Administered 2019-09-24 (×3): 50 ug via INTRAVENOUS

## 2019-09-24 MED ORDER — ONDANSETRON HCL 4 MG PO TABS: 4.0000 mg | ORAL_TABLET | Freq: Four times a day (QID) | ORAL | Status: DC | PRN

## 2019-09-24 MED ORDER — EPHEDRINE SULFATE-NACL 50-0.9 MG/10ML-% IV SOSY
PREFILLED_SYRINGE | INTRAVENOUS | Status: DC | PRN
  Administered 2019-09-24: 10 mg via INTRAVENOUS

## 2019-09-24 MED ORDER — LIDOCAINE 2% (20 MG/ML) 5 ML SYRINGE
INTRAMUSCULAR | Status: DC | PRN
  Administered 2019-09-24: 1.5 mg/kg/h via INTRAVENOUS

## 2019-09-24 MED ORDER — ISOSORBIDE MONONITRATE ER 30 MG PO TB24: 30.0000 mg | ORAL_TABLET | Freq: Every day | ORAL | Status: DC

## 2019-09-24 MED ORDER — ALBUTEROL SULFATE (2.5 MG/3ML) 0.083% IN NEBU: 2.5000 mg | INHALATION_SOLUTION | Freq: Four times a day (QID) | RESPIRATORY_TRACT | Status: DC | PRN

## 2019-09-24 MED ORDER — MIDAZOLAM HCL 5 MG/5ML IJ SOLN
INTRAMUSCULAR | Status: DC | PRN
  Administered 2019-09-24: 2 mg via INTRAVENOUS

## 2019-09-24 MED ORDER — PHENYLEPHRINE HCL-NACL 10-0.9 MG/250ML-% IV SOLN
INTRAVENOUS | Status: DC | PRN
  Administered 2019-09-24: 50 ug/min via INTRAVENOUS

## 2019-09-24 MED ORDER — FENTANYL CITRATE (PF) 100 MCG/2ML IJ SOLN
INTRAMUSCULAR | Status: DC | PRN
  Administered 2019-09-24 (×5): 50 ug via INTRAVENOUS

## 2019-09-24 MED ORDER — PROPOFOL 10 MG/ML IV BOLUS
INTRAVENOUS | Status: DC | PRN
  Administered 2019-09-24: 110 mg via INTRAVENOUS

## 2019-09-24 MED ORDER — STERILE WATER FOR INJECTION IJ SOLN
INTRAMUSCULAR | Status: AC
  Filled 2019-09-24: qty 10

## 2019-09-24 MED ORDER — ENOXAPARIN SODIUM 30 MG/0.3ML ~~LOC~~ SOLN
30.0000 mg | SUBCUTANEOUS | Status: DC
  Administered 2019-09-25: 30 mg via SUBCUTANEOUS
  Filled 2019-09-24: qty 0.3

## 2019-09-24 MED ORDER — FENTANYL CITRATE (PF) 250 MCG/5ML IJ SOLN
INTRAMUSCULAR | Status: AC
  Filled 2019-09-24: qty 5

## 2019-09-24 MED ORDER — OXYCODONE HCL 5 MG/5ML PO SOLN: 5.0000 mg | Freq: Once | ORAL | Status: DC | PRN

## 2019-09-24 MED ORDER — NEOSTIGMINE METHYLSULFATE 3 MG/3ML IV SOSY
PREFILLED_SYRINGE | INTRAVENOUS | Status: DC | PRN
Start: 2019-09-24 — End: 2019-09-24
  Administered 2019-09-24: 3 mg via INTRAVENOUS
  Administered 2019-09-24: 1 mg via INTRAVENOUS

## 2019-09-24 MED ORDER — TRAMADOL HCL 50 MG PO TABS
100.0000 mg | ORAL_TABLET | Freq: Two times a day (BID) | ORAL | Status: DC | PRN
  Administered 2019-09-25: 100 mg via ORAL
  Filled 2019-09-24: qty 2

## 2019-09-24 MED ORDER — CARVEDILOL 25 MG PO TABS
25.0000 mg | ORAL_TABLET | Freq: Two times a day (BID) | ORAL | Status: DC
  Administered 2019-09-24: 25 mg via ORAL
  Filled 2019-09-24: qty 1

## 2019-09-24 MED ORDER — ACETAMINOPHEN 325 MG PO TABS: 325.0000 mg | ORAL_TABLET | ORAL | Status: DC | PRN

## 2019-09-24 MED ORDER — PHENYLEPHRINE 40 MCG/ML (10ML) SYRINGE FOR IV PUSH (FOR BLOOD PRESSURE SUPPORT)
PREFILLED_SYRINGE | INTRAVENOUS | Status: AC
  Filled 2019-09-24: qty 10

## 2019-09-24 MED ORDER — LIP MEDEX EX OINT
TOPICAL_OINTMENT | CUTANEOUS | Status: AC
  Filled 2019-09-24: qty 7

## 2019-09-24 MED ORDER — STERILE WATER FOR INJECTION IJ SOLN
INTRAMUSCULAR | Status: DC | PRN
  Administered 2019-09-24: 10 mL

## 2019-09-24 MED ORDER — SUCCINYLCHOLINE CHLORIDE 200 MG/10ML IV SOSY
PREFILLED_SYRINGE | INTRAVENOUS | Status: AC
  Filled 2019-09-24: qty 10

## 2019-09-24 MED ORDER — CISATRACURIUM BESYLATE 20 MG/10ML IV SOLN
INTRAVENOUS | Status: AC
  Filled 2019-09-24: qty 10

## 2019-09-24 MED ORDER — STERILE WATER FOR IRRIGATION IR SOLN
Status: DC | PRN
  Administered 2019-09-24: 1000 mL

## 2019-09-24 MED ORDER — ONDANSETRON HCL 4 MG/2ML IJ SOLN
INTRAMUSCULAR | Status: DC | PRN
  Administered 2019-09-24: 4 mg via INTRAVENOUS

## 2019-09-24 MED ORDER — ONDANSETRON HCL 4 MG/2ML IJ SOLN
INTRAMUSCULAR | Status: AC
  Filled 2019-09-24: qty 2

## 2019-09-24 MED ORDER — DEXAMETHASONE SODIUM PHOSPHATE 10 MG/ML IJ SOLN
INTRAMUSCULAR | Status: AC
  Filled 2019-09-24: qty 1

## 2019-09-24 MED ORDER — OXYCODONE HCL 5 MG PO TABS
5.0000 mg | ORAL_TABLET | ORAL | Status: DC | PRN
  Administered 2019-09-24: 5 mg via ORAL
  Filled 2019-09-24: qty 1

## 2019-09-24 MED ORDER — CISATRACURIUM BESYLATE (PF) 10 MG/5ML IV SOLN
INTRAVENOUS | Status: DC | PRN
Start: 2019-09-24 — End: 2019-09-24
  Administered 2019-09-24: 8 mg via INTRAVENOUS
  Administered 2019-09-24: 4 mg via INTRAVENOUS

## 2019-09-24 MED ORDER — FENTANYL CITRATE (PF) 100 MCG/2ML IJ SOLN
INTRAMUSCULAR | Status: AC
  Filled 2019-09-24: qty 2

## 2019-09-24 MED ORDER — EPHEDRINE 5 MG/ML INJ
INTRAVENOUS | Status: AC
  Filled 2019-09-24: qty 20

## 2019-09-24 MED ORDER — ONDANSETRON HCL 4 MG/2ML IJ SOLN: 4.0000 mg | Freq: Once | INTRAMUSCULAR | Status: DC | PRN

## 2019-09-24 MED ORDER — PHENYLEPHRINE 40 MCG/ML (10ML) SYRINGE FOR IV PUSH (FOR BLOOD PRESSURE SUPPORT)
PREFILLED_SYRINGE | INTRAVENOUS | Status: DC | PRN
  Administered 2019-09-24: 80 ug via INTRAVENOUS
  Administered 2019-09-24 (×2): 120 ug via INTRAVENOUS

## 2019-09-24 MED ORDER — DEXTROSE-NACL 5-0.45 % IV SOLN: INTRAVENOUS | Status: DC

## 2019-09-24 MED ORDER — SENNOSIDES-DOCUSATE SODIUM 8.6-50 MG PO TABS
2.0000 | ORAL_TABLET | Freq: Every day | ORAL | Status: DC
  Administered 2019-09-24: 2 via ORAL
  Filled 2019-09-24 (×2): qty 2

## 2019-09-24 MED ORDER — NEOSTIGMINE METHYLSULFATE 3 MG/3ML IV SOSY
PREFILLED_SYRINGE | INTRAVENOUS | Status: AC
  Filled 2019-09-24: qty 3

## 2019-09-24 MED ORDER — ONDANSETRON HCL 4 MG/2ML IJ SOLN
4.0000 mg | Freq: Four times a day (QID) | INTRAMUSCULAR | Status: DC | PRN
  Administered 2019-09-24: 4 mg via INTRAVENOUS
  Filled 2019-09-24 (×2): qty 2

## 2019-09-24 MED ORDER — MEPERIDINE HCL 50 MG/ML IJ SOLN: 6.2500 mg | INTRAMUSCULAR | Status: DC | PRN

## 2019-09-24 MED ORDER — GLYCOPYRROLATE PF 0.2 MG/ML IJ SOSY
PREFILLED_SYRINGE | INTRAMUSCULAR | Status: AC
  Filled 2019-09-24: qty 3

## 2019-09-24 MED ORDER — HYDRALAZINE HCL 50 MG PO TABS: 50.0000 mg | ORAL_TABLET | Freq: Three times a day (TID) | ORAL | Status: DC

## 2019-09-24 MED ORDER — ACETAMINOPHEN 160 MG/5ML PO SOLN: 325.0000 mg | ORAL | Status: DC | PRN

## 2019-09-24 MED ORDER — PROPOFOL 10 MG/ML IV BOLUS
INTRAVENOUS | Status: AC
  Filled 2019-09-24: qty 20

## 2019-09-24 MED ORDER — LACTATED RINGERS IR SOLN
Status: DC | PRN
  Administered 2019-09-24: 1000 mL

## 2019-09-24 MED ORDER — SUCCINYLCHOLINE CHLORIDE 200 MG/10ML IV SOSY
PREFILLED_SYRINGE | INTRAVENOUS | Status: DC | PRN
  Administered 2019-09-24: 100 mg via INTRAVENOUS

## 2019-09-24 MED ORDER — MIDAZOLAM HCL 2 MG/2ML IJ SOLN
INTRAMUSCULAR | Status: AC
  Filled 2019-09-24: qty 2

## 2019-09-24 MED ORDER — LIDOCAINE 2% (20 MG/ML) 5 ML SYRINGE
INTRAMUSCULAR | Status: DC | PRN
  Administered 2019-09-24: 60 mg via INTRAVENOUS

## 2019-09-24 MED ORDER — CEFAZOLIN SODIUM-DEXTROSE 2-4 GM/100ML-% IV SOLN
2.0000 g | INTRAVENOUS | Status: AC
  Administered 2019-09-24: 2 g via INTRAVENOUS
  Filled 2019-09-24: qty 100

## 2019-09-24 MED ORDER — ACETAMINOPHEN 500 MG PO TABS
1000.0000 mg | ORAL_TABLET | Freq: Two times a day (BID) | ORAL | Status: DC
  Administered 2019-09-25: 1000 mg via ORAL
  Filled 2019-09-24 (×2): qty 2

## 2019-09-24 MED ORDER — OXYCODONE HCL 5 MG PO TABS: 5.0000 mg | ORAL_TABLET | Freq: Once | ORAL | Status: DC | PRN

## 2019-09-24 MED ORDER — HYDROMORPHONE HCL 1 MG/ML IJ SOLN
0.5000 mg | INTRAMUSCULAR | Status: DC | PRN
  Administered 2019-09-24 – 2019-09-25 (×2): 0.5 mg via INTRAVENOUS
  Filled 2019-09-24 (×2): qty 0.5

## 2019-09-24 MED ORDER — PRAVASTATIN SODIUM 20 MG PO TABS: 80.0000 mg | ORAL_TABLET | Freq: Every day | ORAL | Status: DC

## 2019-09-24 MED ORDER — LIDOCAINE 2% (20 MG/ML) 5 ML SYRINGE
INTRAMUSCULAR | Status: AC
  Filled 2019-09-24: qty 5

## 2019-09-24 MED ORDER — DEXAMETHASONE SODIUM PHOSPHATE 10 MG/ML IJ SOLN
INTRAMUSCULAR | Status: DC | PRN
  Administered 2019-09-24: 5 mg via INTRAVENOUS

## 2019-09-24 MED ORDER — HEPARIN SODIUM (PORCINE) 5000 UNIT/ML IJ SOLN
5000.0000 [IU] | INTRAMUSCULAR | Status: AC
  Administered 2019-09-24: 5000 [IU] via SUBCUTANEOUS
  Filled 2019-09-24: qty 1

## 2019-09-24 MED ORDER — SODIUM CHLORIDE 0.9 % IV SOLN: INTRAVENOUS | Status: DC

## 2019-09-24 SURGICAL SUPPLY — 67 items
APPLICATOR SURGIFLO ENDO (HEMOSTASIS) IMPLANT
BACTOSHIELD CHG 4% 4OZ (MISCELLANEOUS) ×1
BAG LAPAROSCOPIC 12 15 PORT 16 (BASKET) IMPLANT
BAG RETRIEVAL 12/15 (BASKET)
BLADE SURG SZ10 CARB STEEL (BLADE) IMPLANT
COVER BACK TABLE 60X90IN (DRAPES) ×3 IMPLANT
COVER TIP SHEARS 8 DVNC (MISCELLANEOUS) ×2 IMPLANT
COVER TIP SHEARS 8MM DA VINCI (MISCELLANEOUS) ×1
COVER WAND RF STERILE (DRAPES) IMPLANT
DECANTER SPIKE VIAL GLASS SM (MISCELLANEOUS) ×2 IMPLANT
DERMABOND ADVANCED (GAUZE/BANDAGES/DRESSINGS) ×1
DERMABOND ADVANCED .7 DNX12 (GAUZE/BANDAGES/DRESSINGS) ×2 IMPLANT
DRAPE ARM DVNC X/XI (DISPOSABLE) ×8 IMPLANT
DRAPE COLUMN DVNC XI (DISPOSABLE) ×2 IMPLANT
DRAPE DA VINCI XI ARM (DISPOSABLE) ×4
DRAPE DA VINCI XI COLUMN (DISPOSABLE) ×1
DRAPE SHEET LG 3/4 BI-LAMINATE (DRAPES) ×4 IMPLANT
DRAPE SURG IRRIG POUCH 19X23 (DRAPES) ×3 IMPLANT
DRSG OPSITE POSTOP 4X6 (GAUZE/BANDAGES/DRESSINGS) IMPLANT
DRSG OPSITE POSTOP 4X8 (GAUZE/BANDAGES/DRESSINGS) IMPLANT
DRSG TEGADERM 6X8 (GAUZE/BANDAGES/DRESSINGS) ×1 IMPLANT
ELECT REM PT RETURN 15FT ADLT (MISCELLANEOUS) ×3 IMPLANT
GLOVE BIO SURGEON STRL SZ 6 (GLOVE) ×12 IMPLANT
GLOVE BIO SURGEON STRL SZ 6.5 (GLOVE) ×2 IMPLANT
GOWN STRL REUS W/ TWL LRG LVL3 (GOWN DISPOSABLE) ×8 IMPLANT
GOWN STRL REUS W/TWL LRG LVL3 (GOWN DISPOSABLE) ×4
HOLDER FOLEY CATH W/STRAP (MISCELLANEOUS) ×2 IMPLANT
IRRIG SUCT STRYKERFLOW 2 WTIP (MISCELLANEOUS) ×3
IRRIGATION SUCT STRKRFLW 2 WTP (MISCELLANEOUS) ×2 IMPLANT
KIT PROCEDURE DA VINCI SI (MISCELLANEOUS) ×1
KIT PROCEDURE DVNC SI (MISCELLANEOUS) IMPLANT
KIT TURNOVER KIT A (KITS) IMPLANT
MANIPULATOR UTERINE 4.5 ZUMI (MISCELLANEOUS) ×3 IMPLANT
NDL HYPO 21X1.5 SAFETY (NEEDLE) ×2 IMPLANT
NDL SPNL 18GX3.5 QUINCKE PK (NEEDLE) IMPLANT
NEEDLE HYPO 21X1.5 SAFETY (NEEDLE) ×3 IMPLANT
NEEDLE SPNL 18GX3.5 QUINCKE PK (NEEDLE) ×3 IMPLANT
OBTURATOR OPTICAL STANDARD 8MM (TROCAR) ×1
OBTURATOR OPTICAL STND 8 DVNC (TROCAR) ×2
OBTURATOR OPTICALSTD 8 DVNC (TROCAR) ×2 IMPLANT
PACK ROBOT GYN WLCUSTOM (TRAY / TRAY PROCEDURE) ×3 IMPLANT
PAD POSITIONING PINK XL (MISCELLANEOUS) ×3 IMPLANT
PENCIL SMOKE EVACUATOR (MISCELLANEOUS) IMPLANT
PORT ACCESS TROCAR AIRSEAL 12 (TROCAR) ×2 IMPLANT
PORT ACCESS TROCAR AIRSEAL 5M (TROCAR) ×1
POUCH SPECIMEN RETRIEVAL 10MM (ENDOMECHANICALS) IMPLANT
SCRUB CHG 4% DYNA-HEX 4OZ (MISCELLANEOUS) ×2 IMPLANT
SEAL CANN UNIV 5-8 DVNC XI (MISCELLANEOUS) ×6 IMPLANT
SEAL XI 5MM-8MM UNIVERSAL (MISCELLANEOUS) ×4
SET TRI-LUMEN FLTR TB AIRSEAL (TUBING) ×3 IMPLANT
SPONGE LAP 18X18 RF (DISPOSABLE) IMPLANT
SURGIFLO W/THROMBIN 8M KIT (HEMOSTASIS) IMPLANT
SUT MNCRL AB 4-0 PS2 18 (SUTURE) IMPLANT
SUT PDS AB 1 TP1 96 (SUTURE) IMPLANT
SUT VIC AB 0 CT1 27 (SUTURE)
SUT VIC AB 0 CT1 27XBRD ANTBC (SUTURE) IMPLANT
SUT VIC AB 2-0 CT1 27 (SUTURE)
SUT VIC AB 2-0 CT1 TAPERPNT 27 (SUTURE) IMPLANT
SUT VICRYL 4-0 PS2 18IN ABS (SUTURE) ×6 IMPLANT
SYR 10ML LL (SYRINGE) IMPLANT
TOWEL OR NON WOVEN STRL DISP B (DISPOSABLE) ×3 IMPLANT
TRAP SPECIMEN MUCUS 40CC (MISCELLANEOUS) IMPLANT
TRAY FOLEY MTR SLVR 16FR STAT (SET/KITS/TRAYS/PACK) ×3 IMPLANT
TROCAR XCEL NON-BLD 5MMX100MML (ENDOMECHANICALS) IMPLANT
UNDERPAD 30X36 HEAVY ABSORB (UNDERPADS AND DIAPERS) ×3 IMPLANT
WATER STERILE IRR 1000ML POUR (IV SOLUTION) ×3 IMPLANT
YANKAUER SUCT BULB TIP 10FT TU (MISCELLANEOUS) IMPLANT

## 2019-09-24 NOTE — H&P (Addendum)
GYNECOLOGIC ONCOLOGY H&P  History of Present Illness:  Shannon [name] is a 64 y.o. y.o. female who is seen in consultation at the request of Dr. Evie Lacks for an evaluation of newly diagnosed endometrial cancer.  The patient presented with PMB and cervical stenosis; EMB in clinic was not possible due to stenosis. An ultrasound showed findings concerning for adenomyosis as well as a thickened heterogenous endometrial lining with increased blood flow. A pap test returned with HSIL. She was taken to the OR on 4/1 for cone biopsy and D&C.  Pathology returned showing no atypia, dysplasia or malignancy of the cervical cone biopsy but her endometrial biopsy showed FIGO grade 1 endometrioid adenocarcinoma.  Patient reports today that she had several days of light bleeding which prompted her to call her primary care provider who referred her to a gynecologist.  Her bleeding stopped 3 or 4 days ago.  She has had several episodes of light spotting.  She denies any associated pelvic pain or cramping.  She reports having a good appetite without nausea or emesis.  She reports normal bowel and bladder function.  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  Patient is currently on a RAS blockers.  She follows with a nephrologist at [facility] (Dr. Theador Hawthorne).  She has had 2 grafts placed in the past, neither of which had to be used, both which have clotted off.  She has never been on dialysis.  Her most recent creatinine on 3/12 was 3.63.  This looks to be about her baseline over the last 9 months.  She also sees an oncologist in the setting of her anemia due to chronic renal disease at [place]. She is on Aranesp q 4 weeks for hemoglobin less than 10.    Patient denies any shortness of breath or chest pain at rest or with ambulation.  She is currently on disability secondary to her kidney disease.  PAST MEDICAL HISTORY:  Past Medical History:   Diagnosis Date  . Absolute anemia 11/10/2015  . Anemia   . Anemia of renal disease 11/10/2015  . Asthma    has Albuterol inhaler prn  . CHF (congestive heart failure) ([place])    acute systolic CHF 10/[facility])  . Chronic kidney disease    Membranous nephropathy ( Seen at [facility])  . Endometrial adenocarcinoma ([place])   . Gastric ulcer   . History of blood transfusion    no abnormal reaction noted  . History of bronchitis    last time 3-66yrs ago  . Hyperlipidemia    takes Pravastatin daily  . Hypertension    takes Imdur and Hydralazine daily as well as Coreg  . Hypothyroidism    takes Synthroid daily  . Itching    on legs-has a Kenalog cream  . Metabolic bone disease   . Nausea    takes Zofran prn  . Nonischemic cardiomyopathy ([place])    12/2011 normal coronaries, EF 22% (EF normal 04/2013)  . Obesity   . Peripheral edema    takes Torsemide daily  . Thyroid disease    hypothyroidism     PAST SURGICAL HISTORY:  Past Surgical History:  Procedure Laterality Date  . AV FISTULA PLACEMENT Left 07/22/2012   Procedure: INSERTION OF ARTERIOVENOUS GORE-TEX GRAFT ARM;  Surgeon: Angelia Mould, MD;  Location: [place];  Service: Vascular;  Laterality: Left;  . AV FISTULA PLACEMENT Right 05/24/2017   Procedure: INSERTION OF ARTERIOVENOUS (AV) GORE-TEX GRAFT RIGHT ARM;  Surgeon: Angelia Mould, MD;  Location: [facility] OR;  Service: Vascular;  Laterality: Right;  . CESAREAN SECTION     30+yrs ago  . COLONOSCOPY    . DILATION AND CURETTAGE, DIAGNOSTIC / THERAPEUTIC    . left arm surgery with pin     15+yrs ago  . TUBAL LIGATION      OB/GYN HISTORY:  OB History  Gravida Para Term Preterm AB Living  2 2          SAB TAB Ectopic Multiple Live Births               # Outcome Date GA Lbr Len/2nd Weight Sex Delivery Anes PTL Lv  2 Para           1 Para             No LMP recorded. Patient is postmenopausal.  MEDICATIONS: Scheduled Meds: . cisatracurium       Continuous  Infusions: . sodium chloride    .  ceFAZolin (ANCEF) IV     PRN Meds:.   ALLERGIES:  Allergies  Allergen Reactions  . Corticosteroids Hives, Itching, Nausea And Vomiting and Rash    Tolerated dexamethasone when being treated for a reaction to rituximab  . Other Hives, Itching, Nausea And Vomiting and Rash    Tolerated dexamethasone when being treated for a reaction to rituximab Tolerated dexamethasone when being treated for a reaction to rituximab  Tolerated dexamethasone when being treated for a reaction to rituximab  . Rituximab Shortness Of Breath    wheezing wheezing      FAMILY HISTORY:  Family History  Problem Relation Age of Onset  . Kidney disease Other   . Diabetes Mother   . Breast cancer Neg Hx   . Colon cancer Neg Hx   . Uterine cancer Neg Hx   . Ovarian cancer Neg Hx      SOCIAL HISTORY:    Social Connections:   . Frequency of Communication with Friends and Family:   . Frequency of Social Gatherings with Friends and Family:   . Attends Religious Services:   . Active Member of Clubs or Organizations:   . Attends Archivist Meetings:   Marland Kitchen Marital Status:     REVIEW OF SYSTEMS:  Denies appetite changes, fevers, chills, fatigue, unexplained weight changes. Denies hearing loss, neck lumps or masses, mouth sores, ringing in ears or voice changes. Denies cough or wheezing.  Denies shortness of breath. Denies chest pain or palpitations. Denies leg swelling. Denies abdominal distention, pain, blood in stools, constipation, diarrhea, nausea, vomiting, or early satiety. Denies pain with intercourse, dysuria, frequency, hematuria or incontinence. Denies hot flashes, pelvic pain, vaginal bleedingor vaginal discharge.  Denies joint pain, back pain or muscle pain/cramps. Denies itching, rash, or wounds. Denies dizziness, headaches, numbness or seizures. Denies swollen lymph nodes or glands, denies easy bruising or bleeding. Denies anxiety, depression,  confusion, or decreased concentration.  Physical Exam:  Vital Signs for this encounter:  Blood pressure (!) 112/51, pulse 62, temperature 99.1 F (37.3 C), temperature source Temporal, resp. rate 18, height 5\' 3"  (1.6 m), weight 233 lb 6 oz (105.9 kg), SpO2 100 %. Body mass index is 41.34 kg/m. General: Alert, oriented, no acute distress.  HEENT: Normocephalic, atraumatic. Sclera anicteric.  Chest: Clear to auscultation bilaterally. No wheezes, rhonchi, or rales. Cardiovascular: Regular rate and rhythm, no murmurs, rubs, or gallops.  Abdomen: Obese. Normoactive bowel sounds. Soft, nondistended, nontender to palpation. No masses or hepatosplenomegaly  appreciated. No palpable fluid wave.  Well-healed Pfannenstiel incision, and a periumbilical incision. Extremities: Grossly normal range of motion. Warm, well perfused. No edema bilaterally.  Skin: No rashes.  Patient has what looks like small (less than 1 cm) excoriation on the anterior side of both lower legs as well as her back.  She sees dermatology for this. Lymphatics: No cervical, supraclavicular, or inguinal adenopathy.  GU:      Normal external female genitalia.No lesions. No discharge or bleeding. Bladder/urethra: No lesions or masses, well supported bladder Vagina: Mildly atrophic vaginal mucosa. Cervix: No masses appreciated.  Suture still in place from cold knife cone biopsy.  No nodularity noted on bimanual exam. Uterus: Small, mobile, no parametrial involvement or nodularity. Adnexa: No masses.             Rectal: No nodularity.  LABORATORY AND RADIOLOGIC DATA:  Outside medical records were reviewed to synthesize the above history, along with the history and physical obtained during the visit.   Recent Labs       Lab Results  Component Value Date   WBC 7.3 07/24/2019   HGB 10.6 (L) 07/24/2019   HCT 33.9 (L) 07/24/2019   PLT 206 07/24/2019   GLUCOSE 106  (H) 07/24/2019   ALT 12 07/24/2019   AST 13 (L) 07/24/2019   NA 139 07/24/2019   K 3.9 07/24/2019   CL 108 07/24/2019   CREATININE 3.63 (H) 07/24/2019   BUN 28 (H) 07/24/2019   CO2 22 07/24/2019     Pathology from 3/25: Cervical cone-benign endocervical transformation zone mucosa, no atypia, dysplasia or malignancy. Endometrial biopsy-endometrioid adenocarcinoma, FIGO grade 1.  Pelvic ultrasound on 3/11: Uterus measures 5.9 x 3.2 x 3.4 cm, no fibroids or masses visualized.  Endometrial lining measures 9 mm.  Neither ovary visualized.   Lab Results  Component Value Date   WBC 10.7 (H) 09/15/2019   HGB 10.4 (L) 09/15/2019   HCT 33.9 (L) 09/15/2019   PLT 221 09/15/2019   GLUCOSE 92 09/15/2019   ALT 10 09/15/2019   AST 14 (L) 09/15/2019   NA 144 09/15/2019   K 4.2 09/15/2019   CL 112 (H) 09/15/2019   CREATININE 3.82 (H) 09/15/2019   BUN 38 (H) 09/15/2019   CO2 23 09/15/2019    Assessment/Plan:  Clinical stage I grade 1 endometrioid adenocarcinoma in a medically complex patient.  We reviewed the nature of endometrial cancer and its recommended surgical staging, including total hysterectomy, bilateral salpingo-oophorectomy, and lymph node assessment.The patient is a suitable candidate for staging via a minimally invasive approach to surgery. We reviewed that robotic assistance would be used to complete the surgery.  Given her significant kidney disease, discussed plan for medical clearance as well as clearance from her nephrologist.  If patient unable to have robotic hysterectomy and staging, discussed plan for D&C with Mirena IUD placement.  We discussed that most endometrial cancer is detected early and that decisions regarding adjuvant therapy will be made based on her final pathology.   We reviewed the sentinel lymph node technique. Risks and benefits of sentinel lymph node biopsy was reviewed. We reviewed the technique and ICG dye. The patient DOES NOT have  an iodine allergy or known liver dysfunction. We reviewed the false negative rate (0.4%), and that 3% of patients with metastatic disease will not have it detected by SLN biopsy in endometrial cancer. A low risk of allergic reaction to the dye, <0.2% for ICG, has been reported. We also discussed that in the  case of failed mapping, which occurs 40% of the time, a bilateral or unilateral lymphadenectomy will be performed at the surgeon's discretion.   Potential benefits of sentinel nodes including a higher detection rate for metastasis due to ultrastaging and potential reduction in operative morbidity. However, there remains uncertainty as to the role for treatment of micrometastatic disease. Further, the benefit of operative morbidity associated with the SLN technique in endometrial cancer is not yet completely known. In other patient populations (e.g. the cervical cancer population) there has been observed reductions in morbidity with SLN biopsy compared to pelvic lymphadenectomy. Lymphedema, nerve dysfunction and lymphocysts are all potential risks with the SLN technique as with complete lymphadenectomy. Additional risks to the patient include the risk of damage to an internal organ while operating in an altered view (e.g. the black and white image of the robotic fluorescence imaging mode).   We discussed plan for a robotic assisted hysterectomy, bilateral salpingo-oophorectomy, sentinel lymph node evaluation, possible lymph node dissection, possible laparotomy. The risks of surgery were discussed in detail and she understands these to include infection; wound separation; hernia; vaginal cuff separation, injury to adjacent organs such as bowel, bladder, blood vessels, ureters and nerves; bleeding which may require blood transfusion; anesthesia risk; thromboembolic events; possible death; unforeseen complications; possible need for re-exploration; medical complications such as heart attack, stroke, pleural  effusion and pneumonia; and, if full lymphadenectomy is performed the risk of lymphedema and lymphocyst. The patient will receive DVT and antibiotic prophylaxis as indicated. She voiced a clear understanding. She had the opportunity to ask questions. Perioperative instructions were reviewed with her. Prescriptions for post-op medications were sent to her pharmacy of choice.  Tentative surgical date in mid May given need for surgical clearance as well as waiting 6 weeks from her cold knife cone.  A copy of this note was sent to the patient's referring provider.   45 minutes of total time was spent for this patient encounter, including preparation, face-to-face counseling with the patient and coordination of care, and documentation of the encounter.   Jeral Pinch, MD  Division of Gynecologic Oncology  Department of Obstetrics and Gynecology  [facility]  ___________________________________________

## 2019-09-24 NOTE — Anesthesia Procedure Notes (Signed)
Procedure Name: Intubation Date/Time: 09/24/2019 1:58 PM Performed by: West Pugh, CRNA Pre-anesthesia Checklist: Patient identified, Emergency Drugs available, Suction available, Patient being monitored and Timeout performed Patient Re-evaluated:Patient Re-evaluated prior to induction Oxygen Delivery Method: Circle system utilized Preoxygenation: Pre-oxygenation with 100% oxygen Induction Type: IV induction Laryngoscope Size: Mac and 3 Grade View: Grade I Tube type: Oral Number of attempts: 1 Airway Equipment and Method: Stylet Placement Confirmation: ETT inserted through vocal cords under direct vision,  positive ETCO2,  CO2 detector and breath sounds checked- equal and bilateral Secured at: 22 cm Tube secured with: Tape Dental Injury: Teeth and Oropharynx as per pre-operative assessment

## 2019-09-24 NOTE — Op Note (Signed)
OPERATIVE NOTE  Pre-operative Diagnosis: endometrial cancer grade 1  Post-operative Diagnosis: same  Operation: Robotic-assisted laparoscopic total hysterectomy with bilateral salpingoophorectomy, SLN biopsy   Surgeon: Jeral Pinch MD  Assistant Surgeon: Lahoma Crocker MD (an MD assistant was necessary for tissue manipulation, management of robotic instrumentation, retraction and positioning due to the complexity of the case and hospital policies).   Anesthesia: GET  Urine Output: 60cc  Operative Findings:  On EUA, small mobile uterus. On intra-abdominal entry, normal upper abdominal survey. Normal small and large bowel, omentum. Uterus 6cm, manipulator noted to have perforated at the fundus. Atrophic adnexa. Two 1-55mm nodules along the left pelvic sidewall, removed with uterine specimen. Mapping success to right para-ureter, right presacral, left external iliac SLN. No intra-abdominal or pelvic evidence of disease at the end of surgery.  Estimated Blood Loss:  less than 50 mL      Total IV Fluids: 500 ml         Specimens: uterus, cervix, bilateral tubes and ovaries, right para-ureteral, right presacral and left external iliac SLNs         Complications:  None apparent; patient tolerated the procedure well.         Disposition: PACU - hemodynamically stable.  Procedure Details  The patient was seen in the Holding Room. The risks, benefits, complications, treatment options, and expected outcomes were discussed with the patient.  The patient concurred with the proposed plan, giving informed consent.  The site of surgery properly noted/marked. The patient was identified as Shannon [name] and the procedure verified as a Robotic-assisted hysterectomy with bilateral salpingo oophorectomy with SLN biopsy.   After induction of anesthesia, the patient was draped and prepped in the usual sterile manner. Patient was placed in supine position after anesthesia and draped and prepped in  the usual sterile manner as follows: Her arms were tucked to her side with all appropriate precautions.  The shoulders were stabilized with padded shoulder blocks applied to the acromium processes.  The patient was placed in the semi-lithotomy position in [place].  The perineum and vagina were prepped with CholoraPrep. The patient was draped after the CholoraPrep had been allowed to dry for 3 minutes.  A Time Out was held and the above information confirmed.  The urethra was prepped with Betadine. Foley catheter was placed.  A sterile speculum was placed in the vagina.  The cervix was grasped with a single-tooth tenaculum. 2mg  total of ICG was injected into the cervical stroma at 2 and 9 o'clock with 1cc injected at a 1cm and 67mm depth (concentration 0.5mg /ml) in all locations. The cervix was dilated with Kennon Rounds dilators.  The ZUMI uterine manipulator with a medium colpotomizer ring was placed without difficulty.  A pneum occluder balloon was placed over the manipulator.  OG tube placement was confirmed and to suction.   Next, a 10 mm skin incision was made 1 cm below the subcostal margin in the midclavicular line.  The 5 mm Optiview port and scope was used for direct entry.  Opening pressure was under 10 mm CO2.  The abdomen was insufflated and the findings were noted as above.   At this point and all points during the procedure, the patient's intra-abdominal pressure did not exceed 15 mmHg. Next, an 8 mm skin incision was made superior to the umbilicus and a right and left port were placed about 8 cm lateral to the robot port on the right and left side.  A fourth arm was placed on the  right.  The 5 mm assist trocar was exchanged for a 10-12 mm port. All ports were placed under direct visualization.  The patient was placed in steep Trendelenburg.  Bowel was folded away into the upper abdomen.  The robot was docked in the normal manner.  The right and left peritoneum were opened parallel to the IP  ligament to open the retroperitoneal spaces bilaterally. The round ligaments were transected. The SLN mapping was performed in bilateral pelvic basins. After identifying the ureters, the para rectal and paravesical spaces were opened up entirely with careful dissection below the level of the ureters bilaterally and to the depth of the uterine artery origin in order to skeletonize the uterine "web" and ensure visualization of all parametrial channels. The para-aortic basins were carefully exposed and evaluated for isolated para-aortic SLN's. Lymphatic channels were identified travelling to the following visualized sentinel lymph node's: right SLN between the ureter and uterine artery, right presacral SLN and left external iliac SLN. These SLN's were separated from their surrounding lymphatic tissue, removed and sent for permanent pathology.  The hysterectomy was started.  The ureter was again noted to be on the medial leaf of the broad ligament.  The peritoneum above the ureter was incised and stretched and the infundibulopelvic ligament was skeletonized, cauterized and cut.  The posterior peritoneum was taken down to the level of the KOH ring.  The anterior peritoneum was also taken down.  The bladder flap was created to the level of the KOH ring.  The uterine artery on the right side was skeletonized, cauterized and cut in the normal manner.  A similar procedure was performed on the left.  The colpotomy was made and the uterus, cervix, bilateral ovaries and tubes were amputated and delivered through the vagina.  Pedicles were inspected and excellent hemostasis was achieved.  Given some spillage of tumor during removal of the specimen, the pelvis was copiously irrigated to remove all tumor pieces visualized.   The colpotomy at the vaginal cuff was closed with Vicryl on a CT1 needle in a running manner.  Irrigation was used and excellent hemostasis was achieved.  At this point in the procedure was completed.   Robotic instruments were removed under direct visulaization.  The robot was undocked. The fascia at the 10-12 mm port was closed with 0 Vicryl on a UR-5 needle.  The subcuticular tissue was closed with 4-0 Vicryl and the skin was closed with 4-0 Monocryl in a subcuticular manner.  Dermabond was applied.    The vagina was swabbed with  minimal bleeding noted.   All sponge, lap and needle counts were correct x  3.  Foley catheter was removed.  The patient was transferred to the recovery room in stable condition.  Jeral Pinch, MD

## 2019-09-24 NOTE — Plan of Care (Signed)
POC initiated 

## 2019-09-24 NOTE — Anesthesia Preprocedure Evaluation (Addendum)
Anesthesia Evaluation  Patient identified by MRN, date of birth, ID band Patient awake    Reviewed: Allergy & Precautions, NPO status , Patient's Chart, lab work & pertinent test results, reviewed documented beta blocker date and time   History of Anesthesia Complications Negative for: history of anesthetic complications  Airway Mallampati: I  TM Distance: >3 FB Neck ROM: Full    Dental  (+) Edentulous Upper, Missing,    Pulmonary asthma , Current Smoker and Patient abstained from smoking.,    breath sounds clear to auscultation       Cardiovascular hypertension, Pt. on medications and Pt. on home beta blockers +CHF   Rhythm:Regular     Neuro/Psych negative neurological ROS  negative psych ROS   GI/Hepatic Neg liver ROS, PUD,   Endo/Other  Hypothyroidism   Renal/GU CRF and ESRFRenal disease     Musculoskeletal   Abdominal   Peds  Hematology  (+) Blood dyscrasia, anemia ,   Anesthesia Other Findings History includes smoking, anemia, CHF, HLD, CKD stage IV (membraneous nephropathy; s/p LUE AVGG 07/22/12, occluded), asthma, edema, nausea, hypothyroidism, HTN, anemia. - Hospitalized [facility] 12/2011 with acute respiratory failure with CAP and diagnosed with new acute systolic CHF with "concurrent STEMI vs. Acute myocarditis (takotsubo vs. Acute myocarditis)". Cardiac cath showed normal coronaries. Follow-up echo showed recovery of EF. BMI is consistent with morbid obesity.  - Cardiologist is Dr. Kate Sable, last visit 04/21/13 in consultation for follow-up CHF. Echo repeated and showed normalization of her EF.   Meds include albuterol, Coreg, hydralazine, Imdur, Zaroxolyn, pravastatin, Evista, [place], sodium bicarbonate, torsemide.  Echo 04/29/13: Study Conclusions - Left ventricle: The cavity size was normal. Wall thickness was increased in a pattern of mild LVH. Systolic function was normal. The estimated  ejection fraction was in the range of 60% to 65%. Wall motion was normal; there were no regional wall motion abnormalities.  Reproductive/Obstetrics                           Anesthesia Physical  Anesthesia Plan  ASA: III  Anesthesia Plan: General   Post-op Pain Management:    Induction: Intravenous  PONV Risk Score and Plan: 1  Airway Management Planned: Oral ETT  Additional Equipment:   Intra-op Plan:   Post-operative Plan: Extubation in OR  Informed Consent: I have reviewed the patients History and Physical, chart, labs and discussed the procedure including the risks, benefits and alternatives for the proposed anesthesia with the patient or authorized representative who has indicated his/her understanding and acceptance.     Dental advisory given  Plan Discussed with: CRNA, Surgeon and Anesthesiologist  Anesthesia Plan Comments: (  )        Anesthesia Quick Evaluation

## 2019-09-24 NOTE — Anesthesia Postprocedure Evaluation (Signed)
Anesthesia Post Note  Patient: Shannon [name]  Procedure(s) Performed: XI ROBOTIC ASSISTED TOTAL HYSTERECTOMY WITH BILATERAL SALPINGO OOPHORECTOMY (Bilateral ) SENTINEL NODE BIOPSY (N/A )     Patient location during evaluation: PACU Anesthesia Type: General Level of consciousness: awake and alert Pain management: pain level controlled Vital Signs Assessment: post-procedure vital signs reviewed and stable Respiratory status: spontaneous breathing, nonlabored ventilation, respiratory function stable and patient connected to nasal cannula oxygen Cardiovascular status: blood pressure returned to baseline and stable Postop Assessment: no apparent nausea or vomiting Anesthetic complications: no    Last Vitals:  Vitals:   09/24/19 1700 09/24/19 1710  BP: (!) (P) 111/53   Pulse: 94 80  Resp: 13 15  Temp: (!) (P) 36.1 C   SpO2: 96% 96%    Last Pain:  Vitals:   09/24/19 1700  TempSrc:   PainSc: (P) 10-Worst pain ever                 Ayda Tancredi

## 2019-09-24 NOTE — Transfer of Care (Signed)
Immediate Anesthesia Transfer of Care Note  Patient: Shannon [name]  Procedure(s) Performed: XI ROBOTIC ASSISTED TOTAL HYSTERECTOMY WITH BILATERAL SALPINGO OOPHORECTOMY (Bilateral ) SENTINEL NODE BIOPSY (N/A )  Patient Location: PACU  Anesthesia Type:General  Level of Consciousness: awake and patient cooperative  Airway & Oxygen Therapy: Patient Spontanous Breathing and Patient connected to face mask oxygen  Post-op Assessment: Report given to RN and Post -op Vital signs reviewed and stable  Post vital signs: Reviewed and stable  Last Vitals:  Vitals Value Taken Time  BP 110/58 09/24/19 1615  Temp    Pulse 58 09/24/19 1618  Resp 21 09/24/19 1618  SpO2 98 % 09/24/19 1618  Vitals shown include unvalidated device data.  Last Pain:  Vitals:   09/24/19 1050  TempSrc:   PainSc: 0-No pain         Complications: No apparent anesthesia complications

## 2019-09-25 DIAGNOSIS — E8889 Other specified metabolic disorders: Secondary | ICD-10-CM | POA: Diagnosis not present

## 2019-09-25 DIAGNOSIS — J45909 Unspecified asthma, uncomplicated: Secondary | ICD-10-CM | POA: Diagnosis not present

## 2019-09-25 DIAGNOSIS — I130 Hypertensive heart and chronic kidney disease with heart failure and stage 1 through stage 4 chronic kidney disease, or unspecified chronic kidney disease: Secondary | ICD-10-CM | POA: Diagnosis not present

## 2019-09-25 DIAGNOSIS — D631 Anemia in chronic kidney disease: Secondary | ICD-10-CM | POA: Diagnosis not present

## 2019-09-25 DIAGNOSIS — K259 Gastric ulcer, unspecified as acute or chronic, without hemorrhage or perforation: Secondary | ICD-10-CM | POA: Diagnosis not present

## 2019-09-25 DIAGNOSIS — I5021 Acute systolic (congestive) heart failure: Secondary | ICD-10-CM | POA: Diagnosis not present

## 2019-09-25 DIAGNOSIS — I890 Lymphedema, not elsewhere classified: Secondary | ICD-10-CM | POA: Diagnosis not present

## 2019-09-25 DIAGNOSIS — Z888 Allergy status to other drugs, medicaments and biological substances status: Secondary | ICD-10-CM | POA: Diagnosis not present

## 2019-09-25 DIAGNOSIS — Z833 Family history of diabetes mellitus: Secondary | ICD-10-CM | POA: Diagnosis not present

## 2019-09-25 DIAGNOSIS — E785 Hyperlipidemia, unspecified: Secondary | ICD-10-CM | POA: Diagnosis not present

## 2019-09-25 DIAGNOSIS — Z841 Family history of disorders of kidney and ureter: Secondary | ICD-10-CM | POA: Diagnosis not present

## 2019-09-25 DIAGNOSIS — E039 Hypothyroidism, unspecified: Secondary | ICD-10-CM | POA: Diagnosis not present

## 2019-09-25 DIAGNOSIS — N184 Chronic kidney disease, stage 4 (severe): Secondary | ICD-10-CM | POA: Diagnosis not present

## 2019-09-25 DIAGNOSIS — C541 Malignant neoplasm of endometrium: Secondary | ICD-10-CM | POA: Diagnosis not present

## 2019-09-25 DIAGNOSIS — I428 Other cardiomyopathies: Secondary | ICD-10-CM | POA: Diagnosis not present

## 2019-09-25 LAB — BASIC METABOLIC PANEL
Anion gap: 9 (ref 5–15)
BUN: 33 mg/dL — ABNORMAL HIGH (ref 8–23)
CO2: 19 mmol/L — ABNORMAL LOW (ref 22–32)
Calcium: 8.6 mg/dL — ABNORMAL LOW (ref 8.9–10.3)
Chloride: 109 mmol/L (ref 98–111)
Creatinine, Ser: 3.76 mg/dL — ABNORMAL HIGH (ref 0.44–1.00)
GFR calc Af Amer: 14 mL/min — ABNORMAL LOW (ref >60)
GFR calc non Af Amer: 12 mL/min — ABNORMAL LOW (ref >60)
Glucose, Bld: 139 mg/dL — ABNORMAL HIGH (ref 70–99)
Potassium: 4.6 mmol/L (ref 3.5–5.1)
Sodium: 137 mmol/L (ref 135–145)

## 2019-09-25 LAB — CBC
HCT: 33.4 % — ABNORMAL LOW (ref 36.0–46.0)
Hemoglobin: 10.3 g/dL — ABNORMAL LOW (ref 12.0–15.0)
MCH: 31.9 pg (ref 26.0–34.0)
MCHC: 30.8 g/dL (ref 30.0–36.0)
MCV: 103.4 fL — ABNORMAL HIGH (ref 80.0–100.0)
Platelets: 185 10*3/uL (ref 150–400)
RBC: 3.23 MIL/uL — ABNORMAL LOW (ref 3.87–5.11)
RDW: 13.2 % (ref 11.5–15.5)
WBC: 12.9 10*3/uL — ABNORMAL HIGH (ref 4.0–10.5)
nRBC: 0 % (ref 0.0–0.2)

## 2019-09-25 MED ORDER — SENNOSIDES-DOCUSATE SODIUM 8.6-50 MG PO TABS: 2.0000 | ORAL_TABLET | Freq: Every day | ORAL | 0 refills | Status: DC

## 2019-09-25 MED ORDER — OXYCODONE HCL 5 MG PO TABS: 5.0000 mg | ORAL_TABLET | Freq: Four times a day (QID) | ORAL | 0 refills | Status: DC | PRN

## 2019-09-25 NOTE — Plan of Care (Signed)
  Problem: Education: Goal: Knowledge of General Education information will improve Description: Including pain rating scale, medication(s)/side effects and non-pharmacologic comfort measures Outcome: Progressing   Problem: Health Behavior/Discharge Planning: Goal: Ability to manage health-related needs will improve Outcome: Progressing   Problem: Clinical Measurements: Goal: Ability to maintain clinical measurements within normal limits will improve Outcome: Progressing   Problem: Activity: Goal: Risk for activity intolerance will decrease Outcome: Progressing   Problem: Nutrition: Goal: Adequate nutrition will be maintained Outcome: Progressing   Problem: Elimination: Goal: Will not experience complications related to urinary retention Outcome: Progressing   Problem: Pain Managment: Goal: General experience of comfort will improve Outcome: Progressing

## 2019-09-25 NOTE — Discharge Summary (Signed)
Physician Discharge Summary  Patient ID: Shannon [name] MRN: [id number] DOB/AGE: 1956/02/02 64 y.o.  Admit date: 09/24/2019 Discharge date: 09/25/2019  Admission Diagnoses: Endometrial cancer, post-operative state, chronic kidney disease  Discharge Diagnoses:  Active Problems:   Endometrial cancer [facility])   Discharged Condition: good  Hospital Course:  1/ patient was admitted on post-operative monitoring in the setting of renal insufficiency after robotic staging for endometrial cancer.  2/ surgery was uncomplicated  3/ on postoperative day 1 the patient was meeting discharge criteria: tolerating PO, voiding urine, ambulating, pain well controlled on oral medications.  4/ new medications on discharge include: tramadol, senna.  Consults: None  Significant Diagnostic Studies: labs:  CBC    Component Value Date/Time   WBC 12.9 (H) 09/25/2019 0502   RBC 3.23 (L) 09/25/2019 0502   HGB 10.3 (L) 09/25/2019 0502   HCT 33.4 (L) 09/25/2019 0502   PLT 185 09/25/2019 0502   MCV 103.4 (H) 09/25/2019 0502   MCH 31.9 09/25/2019 0502   MCHC 30.8 09/25/2019 0502   RDW 13.2 09/25/2019 0502   LYMPHSABS 1.4 07/24/2019 1010   MONOABS 0.3 07/24/2019 1010   EOSABS 0.3 07/24/2019 1010   BASOSABS 0.0 07/24/2019 1010   CMP Latest Ref Rng & Units 09/25/2019 09/15/2019 07/24/2019  Glucose 70 - 99 mg/dL 139(H) 92 106(H)  BUN 8 - 23 mg/dL 33(H) 38(H) 28(H)  Creatinine 0.44 - 1.00 mg/dL 3.76(H) 3.82(H) 3.63(H)  Sodium 135 - 145 mmol/L 137 144 139  Potassium 3.5 - 5.1 mmol/L 4.6 4.2 3.9  Chloride 98 - 111 mmol/L 109 112(H) 108  CO2 22 - 32 mmol/L 19(L) 23 22  Calcium 8.9 - 10.3 mg/dL 8.6(L) 8.7(L) 8.8(L)  Total Protein 6.5 - 8.1 g/dL - 6.8 6.7  Total Bilirubin 0.3 - 1.2 mg/dL - 0.2(L) 0.7  Alkaline Phos 38 - 126 U/L - 85 78  AST 15 - 41 U/L - 14(L) 13(L)  ALT 0 - 44 U/L - 10 12    Treatments: IV hydration  Discharge Exam: Blood pressure (!) 141/53, pulse (!) 57, temperature 98.2 F (36.8  C), resp. rate 18, height 5\' 3"  (1.6 m), weight 235 lb 1.6 oz (106.6 kg), SpO2 95 %. General appearance: alert and cooperative HEENT: normocephalic, Atraumatic Pulm: clear to ausculation bilaterally, no wheezes or rhonchi CV: regular rate and rhythm, no murmurs or rubs Abd: soft, appropriately ttp, five incisions c/d/I with dermabond in place, + bowel sounds Ext: warm and well perfused, trace edema bilaterally, no claf ttp  Disposition:  home today      Signed: [name] 09/25/2019, 8:34 AM

## 2019-09-25 NOTE — Discharge Instructions (Addendum)
09/24/2019  Return to work: 4-6 weeks if applicable  Activity: 1. Be up and out of the bed during the day.  Take a nap if needed.  You may walk up steps but be careful and use the hand rail.  Stair climbing will tire you more than you think, you may need to stop part way and rest.   2. No lifting or straining for 6 weeks.  3. No driving for 1-2 weeks.  Do Not drive if you are taking narcotic pain medicine and until off narcotic medications.  4. Shower daily.  Use soap and water on your incision and pat dry; don't rub. No tub baths until seen in the office.   5. No sexual activity and nothing in the vagina for 8 weeks.  Medications:  - Take tylenol first line for pain control. Take these regularly (every 6 hours) to decrease the build up of pain. Monitor Tylenol intake and do not exceed 4000 mg in a 24 hour period.  - If necessary, for severe pain not relieved by tylenol, take narcotic pain med.  - While taking pain medication you should take sennakot every night to reduce the likelihood of constipation. If this causes diarrhea, stop its use.  Diet: 1. Low sodium Heart Healthy Diet is recommended.  2. It is safe to use a laxative if you have difficulty moving your bowels.   Wound Care: 1. Keep clean and dry.  Shower daily.  Reasons to call the Doctor:   Fever - Oral temperature greater than 100.4 degrees Fahrenheit  Foul-smelling vaginal discharge  Difficulty urinating  Nausea and vomiting  Increased pain at the site of the incision that is unrelieved with pain medicine.  Difficulty breathing with or without chest pain  New calf pain especially if only on one side  Sudden, continuing increased vaginal bleeding with or without clots.   Follow-up: 1. See Jeral Pinch in 3 weeks. You will have a phone conversation in 1 week to discuss final pathology.  Contacts: For questions or concerns you should contact:  Dr. Jeral Pinch at [phone]  After hours and  on week-ends call [phone] and ask to speak to the physician on call for Gynecologic Oncology

## 2019-09-28 ENCOUNTER — Telehealth: Payer: Self-pay

## 2019-09-28 NOTE — Telephone Encounter (Signed)
Shannon [name] states that she is eating, dring, and urinating well. She had a good BM yesterday. Afebrile. Incisions are D&I. Pain controlled with intermittent tylenol. She is aware of her post op appointments and the office number [phone] to call if she has any questions or concerns.

## 2019-09-29 NOTE — Progress Notes (Signed)
Gynecologic Oncology Telehealth Consult Note: Gyn-Onc  I connected with Shannon [name] on 09/30/19 at  3:15 PM EDT by telephone and verified that I am speaking with the correct person using two identifiers.  I discussed the limitations, risks, security and privacy concerns of performing an evaluation and management service by telemedicine and the availability of in-person appointments. I also discussed with the patient that there may be a patient responsible charge related to this service. The patient expressed understanding and agreed to proceed.  Other persons participating in the visit and their role in the encounter: none.  Patient's location: home Provider's location: [facility]  Reason for Visit: post-op follow-up and treatment discussion  Treatment History: Oncology History  Endometrial/uterine adenocarcinoma ([place])  08/13/2019 Initial Biopsy   CKC and D&C - CKC negativ, EMB showed FIGO gr1 EMCA   08/13/2019 Initial Diagnosis   Endometrial/uterine adenocarcinoma ([place])   09/24/2019 Surgery   TRH/BSO, SLN   09/24/2019 Pathology Results   IA, grade 1, endometrioid adenoca; DOI 1/54m, no LVSI, SLNs negative   09/24/2019 Cancer Staging   Staging form: Corpus Uteri - Carcinoma and Carcinosarcoma, AJCC 8th Edition - Clinical stage from 09/24/2019: FIGO Stage IA (cT1a, cN0(sn), cM0) - Signed by [name] MD on 09/29/2019     Interval History: The patient reports overall doing very well at home.  She has been off narcotics for days now.  She denies any significant abdominal or pelvic pain.  She denies any vaginal bleeding or discharge.  She denies fevers or chills.  She is tolerating p.o. intake without nausea or emesis.  She reports bowel function with the use of laxatives.  She denies any urinary symptoms.  Past Medical/Surgical History: Past Medical History:  Diagnosis Date  . Absolute anemia 11/10/2015  . Anemia   . Anemia of renal disease 11/10/2015  . Asthma    has  Albuterol inhaler prn  . [place]estive [place]    acute systolic CH[facility] 82/9518([facility]  . Chronic kidney disease    [facility]  . [place]ometrial [place]   . Gastric ulcer   . History of blood transfusion    no abnormal reaction noted  . History of bronchitis    last time 3-429yrago  . Hyperlipidemia    takes Pravastatin daily  . Hypertension    takes Imdur and Hydralazine daily as well as Coreg  . Hypothyroidism    takes Synthroid daily  . Itching    on legs-has a Kenalog cream  . Metabolic bone disease   . Nausea    takes Zofran prn  . Nonischemic cardiomyopathy ([place]   12/2011 normal coronaries, EF 22% (EF normal 04/2013)  . Obesity   . Peripheral edema    takes Torsemide daily  . Thyroid disease    hypothyroidism    Past Surgical History:  Procedure Laterality Date  . AV FISTULA PLACEMENT Left 07/22/2012   Procedure: INSERTION OF ARTERIOVENOUS GORE-TEX GRAFT ARM;  Surgeon: ChAngelia MouldMD;  [place];  Laterality: Left;  . AV FISTULA PLACEMENT Right 05/24/2017   Procedure: INSERTION OF ARTERIOVENOUS (AV) GORE-TEX GRAFT RIGHT ARM;  Surgeon: DiAngelia MouldMD;  [place] [facility] Service: V[place];  Laterality: Right;  . CESAREAN SECTION     30+yrs ago  . COLONOSCOPY    . DILATION AND CURETTAGE, DIAGNOSTIC / THERAPEUTIC    . left arm surgery with pin  15+yrs ago  . ROBOTIC ASSISTED TOTAL HYSTERECTOMY WITH BILATERAL SALPINGO OOPHERECTOMY Bilateral 09/24/2019   Procedure: XI ROBOTIC ASSISTED TOTAL HYSTERECTOMY WITH BILATERAL SALPINGO OOPHORECTOMY;  Surgeon: [name], MD;  Location: [facility] ORS;  Service: Gynecology;  Laterality: Bilateral;  . SENTINEL NODE BIOPSY N/A 09/24/2019   Procedure: SENTINEL NODE BIOPSY;  Surgeon: [name], MD;  Location: [facility]S;  Service: Gynecology;  Laterality: N/A;  . TUBAL LIGATION      Family History  Problem Relation Age of  Onset  . Kidney disease Other   . Diabetes Mother   . Breast cancer Neg Hx   . Colon cancer Neg Hx   . Uterine cancer Neg Hx   . Ovarian cancer Neg Hx     Social History   Socioeconomic History  . Marital status: Single    Spouse name: Not on file  . Number of children: Not on file  . Years of education: Not on file  . Highest education level: Not on file  Occupational History  . Not on file  Tobacco Use  . Smoking status: Current Every Day Smoker    Packs/day: 1.00    Years: 40.00    Pack years: 40.00    Types: Cigarettes    Start date: 05/14/1968  . Smokeless tobacco: Never Used  Substance and Sexual Activity  . Alcohol use: No  . Drug use: No  . Sexual activity: Not Currently  Other Topics Concern  . Not on file  Social History Narrative  . Not on file   Social Determinants of Health   Financial Resource Strain:   . Difficulty of Paying Living Expenses:   Food Insecurity:   . Worried About Charity fundraiser in the Last Year:   . Arboriculturist in the Last Year:   Transportation Needs:   . Film/video editor (Medical):   Marland Kitchen Lack of Transportation (Non-Medical):   Physical Activity:   . Days of Exercise per Week:   . Minutes of Exercise per Session:   Stress:   . Feeling of Stress :   Social Connections:   . Frequency of Communication with Friends and Family:   . Frequency of Social Gatherings with Friends and Family:   . Attends Religious Services:   . Active Member of Clubs or Organizations:   . Attends Archivist Meetings:   Marland Kitchen Marital Status:     Current Medications:  Current Outpatient Medications:  .  acetaminophen (TYLENOL) 500 MG tablet, Take 1,000 mg by mouth daily as needed for moderate pain or headache., Disp: , Rfl:  .  albuterol (PROVENTIL HFA;VENTOLIN HFA) 108 (90 BASE) MCG/ACT inhaler, Inhale 1-2 puffs into the lungs every 6 (six) hours as needed for wheezing. , Disp: , Rfl:  .  augmented betamethasone dipropionate  (DIPROLENE-AF) 0.05 % cream, Apply 1 application topically 2 (two) times daily as needed (skin irritation.). , Disp: , Rfl:  .  carvedilol (COREG) 25 MG tablet, Take 25 mg by mouth 2 (two) times daily. , Disp: , Rfl:  .  clobetasol cream (TEMOVATE) 8.58 %, Apply 1 application topically daily as needed (rash). , Disp: , Rfl:  .  hydrALAZINE (APRESOLINE) 50 MG tablet, Take 50 mg by mouth 3 (three) times daily. , Disp: , Rfl:  .  isosorbide mononitrate (IMDUR) 30 MG 24 hr tablet, Take 30 mg by mouth daily., Disp: , Rfl:  .  metolazone (ZAROXOLYN) 5 MG tablet, Take 5 mg by mouth 2 (  two) times daily as needed (swelling). , Disp: , Rfl:  .  oxyCODONE (OXY IR/ROXICODONE) 5 MG immediate release tablet, Take 1 tablet (5 mg total) by mouth every 6 (six) hours as needed for severe pain. Do not take and drive, Disp: 10 tablet, Rfl: 0 .  pravastatin (PRAVACHOL) 80 MG tablet, Take 80 mg by mouth daily., Disp: , Rfl:  .  raloxifene (EVISTA) 60 MG tablet, Take 60 mg by mouth daily., Disp: , Rfl:  .  RAYALDEE 30 MCG CPCR, Take 30 mcg by mouth daily. , Disp: , Rfl:  .  senna-docusate (SENOKOT-S) 8.6-50 MG tablet, Take 2 tablets by mouth at bedtime. Do not take if having diarrhea, Disp: 30 tablet, Rfl: 0 .  sodium bicarbonate 650 MG tablet, Take 1,300 mg by mouth 3 (three) times daily. , Disp: , Rfl:  .  torsemide (DEMADEX) 20 MG tablet, Take 20 mg by mouth daily as needed (swelling). , Disp: , Rfl:   Review of Symptoms: Complete 10-system review is negative except as above in Interval History.  Physical Exam: There were no vitals taken for this visit. Not performed given limitations of phone visit.  Laboratory & Radiologic Studies: A. RIGHT UTERINE SENTINEL LYMPH NODE, EXCISION:  - There is no evidence of carcinoma in 1 of 1 lymph node (0/1).  - See comment.   B. RIGHT PRESACRAL SENTINEL LYMPH NODE, EXCISION:  - Endometriosis.  - There is no evidence of carcinoma in 1 of 1 lymph node (0/1).  - See  comment.   C. LEFT EXTERNAL ILIAC SENTINEL LYMPH NODE, EXCISION:  - There is no evidence of carcinoma in 1 of 1 lymph node (0/1).  - See comment.   D. UTERUS, CERVIX, BILATERAL FALLOPIAN TUBES AND OVARIES, HYSTERECTOMY:  - Endometrioid adenocarcinoma, FIGO grade I/III, involving the inner  half of the myometrium.  - The surgical resection margins are negative for carcinoma.  - See oncology table below.   - Cervix: Multinucleated giant cell reaction.   Atrophy.  - Myometrium: Essentially unremarkable  - Serosa: Essentially unremarkable.  - Right adnexa: Benign ovary and fallopian tube.  - Left adnexa: Benign ovary and fallopian tube.   ONCOLOGY TABLE:   UTERUS, CARCINOMA OR CARCINOSARCOMA   Procedure: Total hysterectomy and bilateral salpingo-oophorectomy  Histologic type: Endometrioid adenocarcinoma  Histologic Grade: FIGO grade I  Myometrial invasion:    Depth of invasion: 1 mm    Myometrial thickness: 15 mm  Uterine Serosa Involvement: Not identified  Cervical stromal involvement: Not identified  Extent of involvement of other organs: Not identified  Lymphovascular invasion: Not identified  Regional Lymph Nodes:    Examined:   3 Sentinel                0 non-sentinel                3 total     Lymph nodes with metastasis: 0  Representative Tumor Block: D7  MMR / MSI testing: MMR and MSI testing will be performed  Pathologic Stage Classification (pTNM, AJCC 8th edition): pT1a, pN0  (FIGO stage Ia)   Assessment & Plan: Shannon [name] is a 64 y.o. 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  The patient overall seems to be doing very well from a postoperative standpoint.  We discussed continued activity restrictions.  We also discussed her final pathology which reveals low risk, low grade disease.  I reviewed that no adjuvant treatment  is necessary.   Patient was happy to hear this news.  She is aware to call the clinic with any problems should they arise before her in person visit.  I discussed the assessment and treatment plan with the patient. The patient was provided with an opportunity to ask questions and all were answered. The patient agreed with the plan and demonstrated an understanding of the instructions.   The patient was advised to call back or see an in-person evaluation if the symptoms worsen or if the condition fails to improve as anticipated.   15 minutes of total time was spent for this patient encounter, including preparation, face-to-face counseling with the patient and coordination of care, and documentation of the encounter.   Jeral Pinch, MD  Division of Gynecologic Oncology  Department of Obstetrics and Gynecology  [facility]

## 2019-09-30 ENCOUNTER — Encounter: Payer: Self-pay | Admitting: Gynecologic Oncology

## 2019-09-30 ENCOUNTER — Inpatient Hospital Stay: Payer: Medicare Other | Attending: Gynecologic Oncology | Admitting: Gynecologic Oncology

## 2019-09-30 DIAGNOSIS — C541 Malignant neoplasm of endometrium: Secondary | ICD-10-CM

## 2019-10-01 DIAGNOSIS — E211 Secondary hyperparathyroidism, not elsewhere classified: Secondary | ICD-10-CM | POA: Diagnosis not present

## 2019-10-01 DIAGNOSIS — N185 Chronic kidney disease, stage 5: Secondary | ICD-10-CM | POA: Diagnosis not present

## 2019-10-01 DIAGNOSIS — E872 Acidosis: Secondary | ICD-10-CM | POA: Diagnosis not present

## 2019-10-01 DIAGNOSIS — R809 Proteinuria, unspecified: Secondary | ICD-10-CM | POA: Diagnosis not present

## 2019-10-01 DIAGNOSIS — N189 Chronic kidney disease, unspecified: Secondary | ICD-10-CM | POA: Diagnosis not present

## 2019-10-01 LAB — SURGICAL PATHOLOGY

## 2019-10-06 ENCOUNTER — Encounter (HOSPITAL_COMMUNITY): Payer: Self-pay | Admitting: Gynecologic Oncology

## 2019-10-07 DIAGNOSIS — E872 Acidosis: Secondary | ICD-10-CM | POA: Diagnosis not present

## 2019-10-07 DIAGNOSIS — R809 Proteinuria, unspecified: Secondary | ICD-10-CM | POA: Diagnosis not present

## 2019-10-07 DIAGNOSIS — D631 Anemia in chronic kidney disease: Secondary | ICD-10-CM | POA: Diagnosis not present

## 2019-10-07 DIAGNOSIS — E211 Secondary hyperparathyroidism, not elsewhere classified: Secondary | ICD-10-CM | POA: Diagnosis not present

## 2019-10-07 DIAGNOSIS — N185 Chronic kidney disease, stage 5: Secondary | ICD-10-CM | POA: Diagnosis not present

## 2019-10-11 DIAGNOSIS — I10 Essential (primary) hypertension: Secondary | ICD-10-CM | POA: Diagnosis not present

## 2019-10-11 DIAGNOSIS — E7800 Pure hypercholesterolemia, unspecified: Secondary | ICD-10-CM | POA: Diagnosis not present

## 2019-10-11 DIAGNOSIS — I509 Heart failure, unspecified: Secondary | ICD-10-CM | POA: Diagnosis not present

## 2019-10-16 ENCOUNTER — Inpatient Hospital Stay (HOSPITAL_COMMUNITY): Payer: Medicare Other | Attending: Hematology

## 2019-10-16 ENCOUNTER — Inpatient Hospital Stay (HOSPITAL_COMMUNITY): Payer: Medicare Other

## 2019-10-16 ENCOUNTER — Other Ambulatory Visit: Payer: Self-pay

## 2019-10-16 ENCOUNTER — Inpatient Hospital Stay (HOSPITAL_BASED_OUTPATIENT_CLINIC_OR_DEPARTMENT_OTHER): Payer: Medicare Other | Admitting: Nurse Practitioner

## 2019-10-16 DIAGNOSIS — D631 Anemia in chronic kidney disease: Secondary | ICD-10-CM

## 2019-10-16 DIAGNOSIS — F17210 Nicotine dependence, cigarettes, uncomplicated: Secondary | ICD-10-CM | POA: Diagnosis not present

## 2019-10-16 DIAGNOSIS — N189 Chronic kidney disease, unspecified: Secondary | ICD-10-CM | POA: Diagnosis not present

## 2019-10-16 DIAGNOSIS — E538 Deficiency of other specified B group vitamins: Secondary | ICD-10-CM | POA: Diagnosis not present

## 2019-10-16 DIAGNOSIS — C541 Malignant neoplasm of endometrium: Secondary | ICD-10-CM | POA: Diagnosis not present

## 2019-10-16 LAB — CBC WITH DIFFERENTIAL/PLATELET
Abs Immature Granulocytes: 0.02 10*3/uL (ref 0.00–0.07)
Basophils Absolute: 0 10*3/uL (ref 0.0–0.1)
Basophils Relative: 0 %
Eosinophils Absolute: 0.3 10*3/uL (ref 0.0–0.5)
Eosinophils Relative: 4 %
HCT: 32.2 % — ABNORMAL LOW (ref 36.0–46.0)
Hemoglobin: 9.9 g/dL — ABNORMAL LOW (ref 12.0–15.0)
Immature Granulocytes: 0 %
Lymphocytes Relative: 21 %
Lymphs Abs: 1.4 10*3/uL (ref 0.7–4.0)
MCH: 31.8 pg (ref 26.0–34.0)
MCHC: 30.7 g/dL (ref 30.0–36.0)
MCV: 103.5 fL — ABNORMAL HIGH (ref 80.0–100.0)
Monocytes Absolute: 0.3 10*3/uL (ref 0.1–1.0)
Monocytes Relative: 5 %
Neutro Abs: 4.7 10*3/uL (ref 1.7–7.7)
Neutrophils Relative %: 70 %
Platelets: 220 10*3/uL (ref 150–400)
RBC: 3.11 MIL/uL — ABNORMAL LOW (ref 3.87–5.11)
RDW: 13.5 % (ref 11.5–15.5)
WBC: 6.8 10*3/uL (ref 4.0–10.5)
nRBC: 0 % (ref 0.0–0.2)

## 2019-10-16 LAB — COMPREHENSIVE METABOLIC PANEL
ALT: 11 U/L (ref 0–44)
AST: 13 U/L — ABNORMAL LOW (ref 15–41)
Albumin: 3.4 g/dL — ABNORMAL LOW (ref 3.5–5.0)
Alkaline Phosphatase: 74 U/L (ref 38–126)
Anion gap: 9 (ref 5–15)
BUN: 33 mg/dL — ABNORMAL HIGH (ref 8–23)
CO2: 26 mmol/L (ref 22–32)
Calcium: 8.8 mg/dL — ABNORMAL LOW (ref 8.9–10.3)
Chloride: 109 mmol/L (ref 98–111)
Creatinine, Ser: 3.63 mg/dL — ABNORMAL HIGH (ref 0.44–1.00)
GFR calc Af Amer: 15 mL/min — ABNORMAL LOW (ref >60)
GFR calc non Af Amer: 13 mL/min — ABNORMAL LOW (ref >60)
Glucose, Bld: 100 mg/dL — ABNORMAL HIGH (ref 70–99)
Potassium: 4.3 mmol/L (ref 3.5–5.1)
Sodium: 144 mmol/L (ref 135–145)
Total Bilirubin: 0.4 mg/dL (ref 0.3–1.2)
Total Protein: 6.5 g/dL (ref 6.5–8.1)

## 2019-10-16 LAB — VITAMIN D 25 HYDROXY (VIT D DEFICIENCY, FRACTURES): Vit D, 25-Hydroxy: 68.25 ng/mL (ref 30–100)

## 2019-10-16 LAB — FERRITIN: Ferritin: 259 ng/mL (ref 11–307)

## 2019-10-16 LAB — VITAMIN B12: Vitamin B-12: 310 pg/mL (ref 180–914)

## 2019-10-16 LAB — IRON AND TIBC
Iron: 59 ug/dL (ref 28–170)
Saturation Ratios: 26 % (ref 10.4–31.8)
TIBC: 228 ug/dL — ABNORMAL LOW (ref 250–450)
UIBC: 169 ug/dL

## 2019-10-16 LAB — LACTATE DEHYDROGENASE: LDH: 133 U/L (ref 98–192)

## 2019-10-16 LAB — FOLATE: Folate: 8.3 ng/mL (ref >5.9)

## 2019-10-16 MED ORDER — EPOETIN ALFA-EPBX 40000 UNIT/ML IJ SOLN
40000.0000 [IU] | Freq: Once | INTRAMUSCULAR | Status: AC
  Administered 2019-10-16: 40000 [IU] via SUBCUTANEOUS
  Filled 2019-10-16: qty 1

## 2019-10-16 NOTE — Assessment & Plan Note (Signed)
1.  Anemia: - This is due to CKD. - Patient is on Aranesp which was started 11/10/2015.  Currently on Aranesp 150 mcg every 28 days.  Last dose was on flow 09/15/2018 - Last colonoscopy was reportedly done in [place], around 7 years ago. - It is recorded that she has a history of AVMs and esophageal varices. -She does have a right forearm fistula has not started dialysis yet. - She will continue Aranesp every 12 week for hemoglobin less than 10.0 g/dL as per nephrology recommendations. -Patient had a negative SPEP. -It has been 10 months since she has needed an Aranesp injection due to her keeping her hemoglobin above 10. -We will continue checking labs and giving injections every 12 weeks. -Labs done on 10/16/2019 showed hemoglobin 9.9 -She will get her injection today on 10/16/2019 - Follow-up in 3 months with labs and injection.   Vitamin B12 deficiency: - Patient initially had borderline low B12 at 206. -We placed her on vitamin B12 sublingual 1 mg daily. - Today labs on 10/16/2019 showed B12 level was at 310 -We will recheck labs next visit.

## 2019-10-16 NOTE — Progress Notes (Signed)
[address] [place], [place] [address]   CLINIC:  Medical Oncology/Hematology  PCP:  [name], MD [place] [address] [phone]   REASON FOR VISIT: Follow-up for anemia   CURRENT THERAPY: Intermittent Aranesp injections  BRIEF ONCOLOGIC HISTORY:  Oncology History Overview Note  MSI-stable   Endometrial/uterine adenocarcinoma ([place])  08/13/2019 Initial Biopsy   CKC and D&C - CKC negativ, EMB showed FIGO gr1 EMCA   08/13/2019 Initial Diagnosis   Endometrial/uterine adenocarcinoma ([place])   09/24/2019 Surgery   TRH/BSO, SLN   09/24/2019 Pathology Results   IA, grade 1, endometrioid adenoca; DOI 1/36m, no LVSI, SLNs negative   09/24/2019 Cancer Staging   Staging form: Corpus Uteri - Carcinoma and Carcinosarcoma, AJCC 8th Edition - Clinical stage from 09/24/2019: FIGO Stage IA (cT1a, cN0(sn), cM0) - Signed by [name] MD on 09/29/2019     CANCER STAGING: Cancer Staging Endometrial/uterine adenocarcinoma ([facility] Staging form: Corpus Uteri - Carcinoma and Carcinosarcoma, AJCC 8th Edition - Clinical stage from 09/24/2019: FIGO Stage IA (cT1a, cN0(sn), cM0) - Signed by [name] MD on 09/29/2019    INTERVAL HISTORY:  Ms. [id number]. female returns for routine follow-up for anemia.  Patient reports she has been doing well since her last visit.  She reports her energy levels are still good.  She has no other issues at this time.  She denies any bright red bleeding per rectum or melena.  She denies any easy bruising or bleeding. Denies any nausea, vomiting, or diarrhea. Denies any new pains. Had not noticed any recent bleeding such as epistaxis, hematuria or hematochezia. Denies recent chest pain on exertion, shortness of breath on minimal exertion, pre-syncopal episodes, or palpitations. Denies any numbness or tingling in hands or feet. Denies any recent fevers, infections, or recent hospitalizations. Patient reports  appetite at 100% and energy level at 100%.  She is eating well maintain her weight at this time.    REVIEW OF SYSTEMS:  Review of Systems  All other systems reviewed and are negative.    PAST MEDICAL/SURGICAL HISTORY:  Past Medical History:  Diagnosis Date  . Absolute anemia 11/10/2015  . Anemia   . Anemia of renal disease 11/10/2015  . Asthma    has Albuterol inhaler prn  . CHF (congestive heart failure) ([place]    acute systolic CHF 89/3267([facility]  . Chronic kidney disease    [facility]  . Endometrial adenocarcinoma ([place]   . Gastric ulcer   . History of blood transfusion    no abnormal reaction noted  . History of bronchitis    last time 3-465yrago  . Hyperlipidemia    takes Pravastatin daily  . Hypertension    takes Imdur and Hydralazine daily as well as Coreg  . Hypothyroidism    takes Synthroid daily  . Itching    on legs-has a Kenalog cream  . Metabolic bone disease   . Nausea    takes Zofran prn  . Nonischemic cardiomyopathy ([place]   12/2011 normal coronaries, EF 22% (EF normal 04/2013)  . Obesity   . Peripheral edema    takes Torsemide daily  . Thyroid disease    hypothyroidism   Past Surgical History:  Procedure Laterality Date  . AV FISTULA PLACEMENT Left 07/22/2012   Procedure: INSERTION OF ARTERIOVENOUS GORE-TEX GRAFT ARM;  Surgeon: ChAngelia MouldMD;  [facility] Service: Vascular;  Laterality: Left;  .  AV FISTULA PLACEMENT Right 05/24/2017   Procedure: INSERTION OF ARTERIOVENOUS (AV) GORE-TEX GRAFT RIGHT ARM;  Surgeon: Angelia Mould, MD;  Location: [place];  Service: Vascular;  Laterality: Right;  . CESAREAN SECTION     30+yrs ago  . COLONOSCOPY    . DILATION AND CURETTAGE, DIAGNOSTIC / THERAPEUTIC    . left arm surgery with pin     15+yrs ago  . ROBOTIC ASSISTED TOTAL HYSTERECTOMY WITH BILATERAL SALPINGO OOPHERECTOMY Bilateral 09/24/2019   Procedure: XI ROBOTIC ASSISTED TOTAL HYSTERECTOMY WITH  BILATERAL SALPINGO OOPHORECTOMY;  Surgeon: [name], MD;  Location: [facility];  Service: Gynecology;  Laterality: Bilateral;  . SENTINEL NODE BIOPSY N/A 09/24/2019   Procedure: SENTINEL NODE BIOPSY;  Surgeon: [name], MD;  [facility]: [facility];  Service: Gynecology;  Laterality: N/A;  . TUBAL LIGATION       SOCIAL HISTORY:  Social History   Socioeconomic History  . Marital status: Single    Spouse name: Not on file  . Number of children: Not on file  . Years of education: Not on file  . Highest education level: Not on file  Occupational History  . Not on file  Tobacco Use  . Smoking status: Current Every Day Smoker    Packs/day: 1.00    Years: 40.00    Pack years: 40.00    Types: Cigarettes    Start date: 05/14/1968  . Smokeless tobacco: Never Used  Substance and Sexual Activity  . Alcohol use: No  . Drug use: No  . Sexual activity: Not Currently  Other Topics Concern  . Not on file  Social History Narrative  . Not on file   Social Determinants of Health   Financial Resource Strain:   . Difficulty of Paying Living Expenses:   Food Insecurity:   . Worried About Charity fundraiser in the Last Year:   . Arboriculturist in the Last Year:   Transportation Needs:   . Film/video editor (Medical):   Marland Kitchen Lack of Transportation (Non-Medical):   Physical Activity:   . Days of Exercise per Week:   . Minutes of Exercise per Session:   Stress:   . Feeling of Stress :   Social Connections:   . Frequency of Communication with Friends and Family:   . Frequency of Social Gatherings with Friends and Family:   . Attends Religious Services:   . Active Member of Clubs or Organizations:   . Attends Archivist Meetings:   Marland Kitchen Marital Status:   Intimate Partner Violence:   . Fear of Current or Ex-Partner:   . Emotionally Abused:   Marland Kitchen Physically Abused:   . Sexually Abused:     FAMILY HISTORY:  Family History  Problem Relation Age of Onset  . Kidney  disease Other   . Diabetes Mother   . Breast cancer Neg Hx   . Colon cancer Neg Hx   . Uterine cancer Neg Hx   . Ovarian cancer Neg Hx     CURRENT MEDICATIONS:  Outpatient Encounter Medications as of 10/16/2019  Medication Sig  . carvedilol (COREG) 25 MG tablet Take 25 mg by mouth 2 (two) times daily.   . hydrALAZINE (APRESOLINE) 50 MG tablet Take 50 mg by mouth 3 (three) times daily.   . isosorbide mononitrate (IMDUR) 30 MG 24 hr tablet Take 30 mg by mouth daily.  . pravastatin (PRAVACHOL) 80 MG tablet Take 80 mg by mouth daily.  . raloxifene (EVISTA)  60 MG tablet Take 60 mg by mouth daily.  Marland Kitchen RAYALDEE 30 MCG CPCR Take 30 mcg by mouth daily.   Marland Kitchen senna-docusate (SENOKOT-S) 8.6-50 MG tablet Take 2 tablets by mouth at bedtime. Do not take if having diarrhea  . sodium bicarbonate 650 MG tablet Take 1,300 mg by mouth 3 (three) times daily.   Marland Kitchen acetaminophen (TYLENOL) 500 MG tablet Take 1,000 mg by mouth daily as needed for moderate pain or headache.  . albuterol (PROVENTIL HFA;VENTOLIN HFA) 108 (90 BASE) MCG/ACT inhaler Inhale 1-2 puffs into the lungs every 6 (six) hours as needed for wheezing.   Marland Kitchen augmented betamethasone dipropionate (DIPROLENE-AF) 0.05 % cream Apply 1 application topically 2 (two) times daily as needed (skin irritation.).   Marland Kitchen clobetasol cream (TEMOVATE) 0.34 % Apply 1 application topically daily as needed (rash).   . metolazone (ZAROXOLYN) 5 MG tablet Take 5 mg by mouth 2 (two) times daily as needed (swelling).   Marland Kitchen oxyCODONE (OXY IR/ROXICODONE) 5 MG immediate release tablet Take 1 tablet (5 mg total) by mouth every 6 (six) hours as needed for severe pain. Do not take and drive (Patient not taking: Reported on 10/16/2019)  . torsemide (DEMADEX) 20 MG tablet Take 20 mg by mouth daily as needed (swelling).    No facility-administered encounter medications on file as of 10/16/2019.    ALLERGIES:  Allergies  Allergen Reactions  . Corticosteroids Hives, Itching, Nausea And  Vomiting and Rash    Tolerated dexamethasone when being treated for a reaction to rituximab  . Other Hives, Itching, Nausea And Vomiting and Rash    Tolerated dexamethasone when being treated for a reaction to rituximab Tolerated dexamethasone when being treated for a reaction to rituximab  Tolerated dexamethasone when being treated for a reaction to rituximab  . Rituximab Shortness Of Breath    wheezing wheezing      PHYSICAL EXAM:  ECOG Performance status: 1  Vitals:   10/16/19 1121  BP: (!) 121/57  Pulse: 62  Resp: 18  Temp: (!) 96.6 F (35.9 C)  SpO2: 99%   Filed Weights   10/16/19 1121  Weight: 232 lb 12.8 oz (105.6 kg)   Physical Exam Constitutional:      Appearance: Normal appearance. She is normal weight.  Cardiovascular:     Rate and Rhythm: Normal rate and regular rhythm.     Heart sounds: Normal heart sounds.  Pulmonary:     Effort: Pulmonary effort is normal.     Breath sounds: Normal breath sounds.  Abdominal:     General: Bowel sounds are normal.     Palpations: Abdomen is soft.  Musculoskeletal:        General: Normal range of motion.  Skin:    General: Skin is warm.  Neurological:     Mental Status: She is alert and oriented to person, place, and time. Mental status is at baseline.  Psychiatric:        Mood and Affect: Mood normal.        Behavior: Behavior normal.        Thought Content: Thought content normal.        Judgment: Judgment normal.      LABORATORY DATA:  I have reviewed the labs as listed.  CBC    Component Value Date/Time   WBC 6.8 10/16/2019 1045   RBC 3.11 (L) 10/16/2019 1045   HGB 9.9 (L) 10/16/2019 1045   HCT 32.2 (L) 10/16/2019 1045   PLT 220 10/16/2019 1045  MCV 103.5 (H) 10/16/2019 1045   MCH 31.8 10/16/2019 1045   MCHC 30.7 10/16/2019 1045   RDW 13.5 10/16/2019 1045   LYMPHSABS 1.4 10/16/2019 1045   MONOABS 0.3 10/16/2019 1045   EOSABS 0.3 10/16/2019 1045   BASOSABS 0.0 10/16/2019 1045   CMP Latest Ref  Rng & Units 10/16/2019 09/25/2019 09/15/2019  Glucose 70 - 99 mg/dL 100(H) 139(H) 92  BUN 8 - 23 mg/dL 33(H) 33(H) 38(H)  Creatinine 0.44 - 1.00 mg/dL 3.63(H) 3.76(H) 3.82(H)  Sodium 135 - 145 mmol/L 144 137 144  Potassium 3.5 - 5.1 mmol/L 4.3 4.6 4.2  Chloride 98 - 111 mmol/L 109 109 112(H)  CO2 22 - 32 mmol/L 26 19(L) 23  Calcium 8.9 - 10.3 mg/dL 8.8(L) 8.6(L) 8.7(L)  Total Protein 6.5 - 8.1 g/dL 6.5 - 6.8  Total Bilirubin 0.3 - 1.2 mg/dL 0.4 - 0.2(L)  Alkaline Phos 38 - 126 U/L 74 - 85  AST 15 - 41 U/L 13(L) - 14(L)  ALT 0 - 44 U/L 11 - 10   All questions were answered to patient's stated satisfaction. Encouraged patient to call with any new concerns or questions before his next visit to the cancer center and we can certain see him sooner, if needed.     ASSESSMENT & PLAN:  Anemia of renal disease 1.  Anemia: - This is due to CKD. - Patient is on Aranesp which was started 11/10/2015.  Currently on Aranesp 150 mcg every 28 days.  Last dose was on flow 09/15/2018 - Last colonoscopy was reportedly done in [place], around 7 years ago. - It is recorded that she has a history of AVMs and esophageal varices. -She does have a right forearm fistula has not started dialysis yet. - She will continue Aranesp every 12 week for hemoglobin less than 10.0 g/dL as per nephrology recommendations. -Patient had a negative SPEP. -It has been 10 months since she has needed an Aranesp injection due to her keeping her hemoglobin above 10. -We will continue checking labs and giving injections every 12 weeks. -Labs done on 10/16/2019 showed hemoglobin 9.9 -She will get her injection today on 10/16/2019 - Follow-up in 3 months with labs and injection.   Vitamin B12 deficiency: - Patient initially had borderline low B12 at 206. -We placed her on vitamin B12 sublingual 1 mg daily. - Today labs on 10/16/2019 showed B12 level was at 310 -We will recheck labs next visit.     Orders placed this encounter:  Orders Placed  This Encounter  Procedures  . Lactate dehydrogenase  . CBC with Differential/Platelet  . Comprehensive metabolic panel  . Ferritin  . Iron and TIBC  . Vitamin B12  . VITAMIN D 25 Hydroxy (Vit-D Deficiency, Fractures)      Francene Finders, FNP-C [place] [phone]

## 2019-10-18 NOTE — Progress Notes (Signed)
Gynecologic Oncology Return Clinic Visit  10/19/19  Reason for Visit: Postop visit  Treatment History: Oncology History Overview Note  MSI-stable   Endometrial/uterine adenocarcinoma ([facility])  08/13/2019 Initial Biopsy   CKC and D&C - CKC negativ, EMB showed FIGO gr1 EMCA   08/13/2019 Initial Diagnosis   Endometrial/uterine adenocarcinoma ([facility])   09/24/2019 Surgery   TRH/BSO, SLN   09/24/2019 Pathology Results   IA, grade 1, endometrioid adenoca; DOI 1/15mm, no LVSI, SLNs negative   09/24/2019 Cancer Staging   Staging form: Corpus Uteri - Carcinoma and Carcinosarcoma, AJCC 8th Edition - Clinical stage from 09/24/2019: FIGO Stage IA (cT1a, cN0(sn), cM0) - Signed by Tucker, Katherine R, MD on 09/29/2019     Interval History: Patient reports overall doing well since surgery.  She denies any vaginal bleeding or discharge.  She endorses having a good appetite without any nausea or vomiting.  She has been off of pain medication for some time now.  She had constipation initially after surgery but reports normal bowel function now.  Denies any urinary symptoms.  Denies any fevers or chills.  Past Medical/Surgical History: Past Medical History:  Diagnosis Date  . Absolute anemia 11/10/2015  . Anemia   . Anemia of renal disease 11/10/2015  . Asthma    has Albuterol inhaler prn  . CHF (congestive heart failure) ([facility])    acute systolic CHF 12/2011 ([facility])  . Chronic kidney disease    Membranous nephropathy ( Se[facility] at [facility])  . Endometrial adenocarcinoma ([facility])   . Gastric ulcer   . History of blood transfusion    no abnormal reaction noted  . History of bronchitis    last time 3-4yrs ago  . Hyperlipidemia    takes Pravastatin daily  . Hypertension    takes Imdur and Hydralazine daily as well as Coreg  . Hypothyroidism    takes Synthroid daily  . Itching    on legs-has a Kenalog cream  . Metabolic bone disease   . Nausea    takes Zofran prn  . Nonischemic cardiomyopathy ([facility])    12/2011  normal coronaries, EF 22% (EF normal 04/2013)  . Obesity   . Peripheral edema    takes Torsemide daily  . Thyroid disease    hypothyroidism    Past Surgical History:  Procedure Laterality Date  . AV FISTULA PLACEMENT Left 07/22/2012   Procedure: INSERTION OF ARTERIOVENOUS GORE-TEX GRAFT ARM;  Surgeon: Christopher S Dickson, MD;  [facility];  Service: Vascular;  Laterality: Left;  . AV FISTULA PLACEMENT Right 05/24/2017   Procedure: INSERTION OF ARTERIOVENOUS (AV) GORE-TEX GRAFT RIGHT ARM;  Surgeon: Dickson, Christopher S, MD;  Location: [facility];  Service: Vascular;  Laterality: Right;  . CESAREAN SECTION     30+yrs ago  . COLONOSCOPY    . DILATION AND CURETTAGE, DIAGNOSTIC / THERAPEUTIC    . left arm surgery with pin     15+yrs ago  . ROBOTIC ASSISTED TOTAL HYSTERECTOMY WITH BILATERAL SALPINGO OOPHERECTOMY Bilateral 09/24/2019   Procedure: XI ROBOTIC ASSISTED TOTAL HYSTERECTOMY WITH BILATERAL SALPINGO OOPHORECTOMY;  Surgeon: Tucker, Katherine R, MD;  [facility];  Service: Gynecology;  Laterality: Bilateral;  . SENTINEL NODE BIOPSY N/A 09/24/2019   Procedure: SENTINEL NODE BIOPSY;  Surgeon: Tucker, Katherine R, MD;  [facility];  Service: Gynecology;  Laterality: N/A;  . TUBAL LIGATION      Family History  Problem Relation Age of Onset  . Kidney disease Other   . Diabetes Mother   . Breast   cancer Neg Hx   . Colon cancer Neg Hx   . Uterine cancer Neg Hx   . Ovarian cancer Neg Hx     Social History   Socioeconomic History  . Marital status: Single    Spouse name: Not on file  . Number of children: Not on file  . Years of education: Not on file  . Highest education level: Not on file  Occupational History  . Not on file  Tobacco Use  . Smoking status: Current Every Day Smoker    Packs/day: 1.00    Years: 40.00    Pack years: 40.00    Types: Cigarettes    Start date: 05/14/1968  . Smokeless tobacco: Never Used  Substance and Sexual Activity  . Alcohol use:  No  . Drug use: No  . Sexual activity: Not Currently  Other Topics Concern  . Not on file  Social History Narrative  . Not on file   Social Determinants of Health   Financial Resource Strain:   . Difficulty of Paying Living Expenses:   Food Insecurity:   . Worried About Running Out of Food in the Last Year:   . Ran Out of Food in the Last Year:   Transportation Needs:   . Lack of Transportation (Medical):   . Lack of Transportation (Non-Medical):   Physical Activity:   . Days of Exercise per Week:   . Minutes of Exercise per Session:   Stress:   . Feeling of Stress :   Social Connections:   . Frequency of Communication with Friends and Family:   . Frequency of Social Gatherings with Friends and Family:   . Attends Religious Services:   . Active Member of Clubs or Organizations:   . Attends Club or Organization Meetings:   . Marital Status:     Current Medications:  Current Outpatient Medications:  .  acetaminophen (TYLENOL) 500 MG tablet, Take 1,000 mg by mouth daily as needed for moderate pain or headache., Disp: , Rfl:  .  albuterol (PROVENTIL HFA;VENTOLIN HFA) 108 (90 BASE) MCG/ACT inhaler, Inhale 1-2 puffs into the lungs every 6 (six) hours as needed for wheezing. , Disp: , Rfl:  .  augmented betamethasone dipropionate (DIPROLENE-AF) 0.05 % cream, Apply 1 application topically 2 (two) times daily as needed (skin irritation.). , Disp: , Rfl:  .  carvedilol (COREG) 25 MG tablet, Take 25 mg by mouth 2 (two) times daily. , Disp: , Rfl:  .  clobetasol cream (TEMOVATE) 0.05 %, Apply 1 application topically daily as needed (rash). , Disp: , Rfl:  .  hydrALAZINE (APRESOLINE) 50 MG tablet, Take 50 mg by mouth 3 (three) times daily. , Disp: , Rfl:  .  isosorbide mononitrate (IMDUR) 30 MG 24 hr tablet, Take 30 mg by mouth daily., Disp: , Rfl:  .  metolazone (ZAROXOLYN) 5 MG tablet, Take 5 mg by mouth 2 (two) times daily as needed (swelling). , Disp: , Rfl:  .  pravastatin  (PRAVACHOL) 80 MG tablet, Take 80 mg by mouth daily., Disp: , Rfl:  .  raloxifene (EVISTA) 60 MG tablet, Take 60 mg by mouth daily., Disp: , Rfl:  .  RAYALDEE 30 MCG CPCR, Take 30 mcg by mouth daily. , Disp: , Rfl:  .  senna-docusate (SENOKOT-S) 8.6-50 MG tablet, Take 2 tablets by mouth at bedtime. Do not take if having diarrhea, Disp: 30 tablet, Rfl: 0 .  sodium bicarbonate 650 MG tablet, Take 1,300 mg by mouth 3 (three) times   daily. , Disp: , Rfl:  .  torsemide (DEMADEX) 20 MG tablet, Take 20 mg by mouth daily as needed (swelling). , Disp: , Rfl:  .  oxyCODONE (OXY IR/ROXICODONE) 5 MG immediate release tablet, Take 1 tablet (5 mg total) by mouth every 6 (six) hours as needed for severe pain. Do not take and drive (Patient not taking: Reported on 10/16/2019), Disp: 10 tablet, Rfl: 0  Review of Systems: Denies appetite changes, fevers, chills, fatigue, unexplained weight changes. Denies hearing loss, neck lumps or masses, mouth sores, ringing in ears or voice changes. Denies cough or wheezing.  Denies shortness of breath. Denies chest pain or palpitations. Denies leg swelling. Denies abdominal distention, pain, blood in stools, constipation, diarrhea, nausea, vomiting, or early satiety. Denies pain with intercourse, dysuria, frequency, hematuria or incontinence. Denies hot flashes, pelvic pain, vaginal bleeding or vaginal discharge.   Denies joint pain, back pain or muscle pain/cramps. Denies itching, rash, or wounds. Denies dizziness, headaches, numbness or seizures. Denies swollen lymph nodes or glands, denies easy bruising or bleeding. Denies anxiety, depression, confusion, or decreased concentration.  Physical Exam: BP (!) 112/46 (BP Location: Right Arm, Patient Position: Sitting)   Pulse 65   Temp (!) 96.9 F (36.1 C) (Tympanic)   Resp 16   Ht 5' 3" (1.6 m)   Wt 231 lb (104.8 kg)   SpO2 99%   BMI 40.92 kg/m  General: Alert, oriented, no acute distress. HEENT: Normocephalic,  atraumatic, sclera anicteric. Chest: Clear to auscultation bilaterally.  No wheezes or rhonchi. Abdomen: Obese, soft, nontender.  Normoactive bowel sounds.  No masses or hepatosplenomegaly appreciated.  Well-healing robotic incisions, scab on left lateral incision.  No erythema, induration or exudate.  No Dermabond. Extremities: Grossly normal range of motion.  Warm, well perfused.  Some excoriations on anterior shins, trace edema bilaterally. GU: Normal appearing external genitalia without erythema, excoriation, or lesions.  Speculum exam reveals mildly atrophic vaginal mucosa, cuff intact no bleeding or discharge noted.  Some suture still visible.  Bimanual exam reveals cuff intact, no tenderness or fluctuation.  Laboratory & Radiologic Studies: CBC    Component Value Date/Time   WBC 6.8 10/16/2019 1045   RBC 3.11 (L) 10/16/2019 1045   HGB 9.9 (L) 10/16/2019 1045   HCT 32.2 (L) 10/16/2019 1045   PLT 220 10/16/2019 1045   MCV 103.5 (H) 10/16/2019 1045   MCH 31.8 10/16/2019 1045   MCHC 30.7 10/16/2019 1045   RDW 13.5 10/16/2019 1045   LYMPHSABS 1.4 10/16/2019 1045   MONOABS 0.3 10/16/2019 1045   EOSABS 0.3 10/16/2019 1045   BASOSABS 0.0 10/16/2019 1045   CMP Latest Ref Rng & Units 10/16/2019 09/25/2019 09/15/2019  Glucose 70 - 99 mg/dL 100(H) 139(H) 92  BUN 8 - 23 mg/dL 33(H) 33(H) 38(H)  Creatinine 0.44 - 1.00 mg/dL 3.63(H) 3.76(H) 3.82(H)  Sodium 135 - 145 mmol/L 144 137 144  Potassium 3.5 - 5.1 mmol/L 4.3 4.6 4.2  Chloride 98 - 111 mmol/L 109 109 112(H)  CO2 22 - 32 mmol/L 26 19(L) 23  Calcium 8.9 - 10.3 mg/dL 8.8(L) 8.6(L) 8.7(L)  Total Protein 6.5 - 8.1 g/dL 6.5 - 6.8  Total Bilirubin 0.3 - 1.2 mg/dL 0.4 - 0.2(L)  Alkaline Phos 38 - 126 U/L 74 - 85  AST 15 - 41 U/L 13(L) - 14(L)  ALT 0 - 44 U/L 11 - 10    Assessment & Plan: Shannon [name] is a 63 y.o. woman with Stage 1A, grade 1 low risk,   early stage endometrioid adenocarcinoma who presents for postoperative  follow-up.  Patient is overall doing well and meeting all postoperative milestones.  We discussed continued activity restriction and nothing in the vagina for 8 weeks after surgery.  Patient was given a copy of her final pathology report and we reviewed this again together.  Per [facility] surveillance recommendations, I discussed surveillance visits every 6 months for the first year and then yearly until 5 years from diagnosis.  Given low risk of recurrence, there is no indication for adjuvant therapy.  We discussed the signs and symptoms that would be concerning for disease recurrence that should prompt a phone call prior to her next visit.  20 minutes of total time was spent for this patient encounter, including preparation, face-to-face counseling with the patient and coordination of care, and documentation of the encounter.  Katherine Tucker, MD  Division of Gynecologic Oncology  Department of Obstetrics and Gynecology  [facility]   

## 2019-10-19 ENCOUNTER — Other Ambulatory Visit: Payer: Self-pay

## 2019-10-19 ENCOUNTER — Encounter: Payer: Self-pay | Admitting: Gynecologic Oncology

## 2019-10-19 ENCOUNTER — Inpatient Hospital Stay: Payer: Medicare Other | Attending: Gynecologic Oncology | Admitting: Gynecologic Oncology

## 2019-10-19 VITALS — BP 112/46 | HR 65 | Temp 96.9°F | Resp 16 | Ht 63.0 in | Wt 231.0 lb

## 2019-10-19 DIAGNOSIS — Z90710 Acquired absence of both cervix and uterus: Secondary | ICD-10-CM

## 2019-10-19 DIAGNOSIS — Z90722 Acquired absence of ovaries, bilateral: Secondary | ICD-10-CM

## 2019-10-19 DIAGNOSIS — C541 Malignant neoplasm of endometrium: Secondary | ICD-10-CM

## 2019-10-19 NOTE — Patient Instructions (Signed)
You are healing well from surgery!  Remember that your activity or restrictions are in place until you are 6-8 weeks out from surgery.  If you develop any symptoms concerning for cancer recurrence such as vaginal bleeding or pelvic pain, please call the clinic before your next visit at [phone].

## 2019-11-11 DIAGNOSIS — I10 Essential (primary) hypertension: Secondary | ICD-10-CM | POA: Diagnosis not present

## 2019-11-11 DIAGNOSIS — E7800 Pure hypercholesterolemia, unspecified: Secondary | ICD-10-CM | POA: Diagnosis not present

## 2019-11-11 DIAGNOSIS — I509 Heart failure, unspecified: Secondary | ICD-10-CM | POA: Diagnosis not present

## 2019-12-07 DIAGNOSIS — N185 Chronic kidney disease, stage 5: Secondary | ICD-10-CM | POA: Diagnosis not present

## 2019-12-11 DIAGNOSIS — R809 Proteinuria, unspecified: Secondary | ICD-10-CM | POA: Diagnosis not present

## 2019-12-11 DIAGNOSIS — E211 Secondary hyperparathyroidism, not elsewhere classified: Secondary | ICD-10-CM | POA: Diagnosis not present

## 2019-12-11 DIAGNOSIS — I509 Heart failure, unspecified: Secondary | ICD-10-CM | POA: Diagnosis not present

## 2019-12-11 DIAGNOSIS — E7800 Pure hypercholesterolemia, unspecified: Secondary | ICD-10-CM | POA: Diagnosis not present

## 2019-12-11 DIAGNOSIS — I10 Essential (primary) hypertension: Secondary | ICD-10-CM | POA: Diagnosis not present

## 2019-12-11 DIAGNOSIS — D631 Anemia in chronic kidney disease: Secondary | ICD-10-CM | POA: Diagnosis not present

## 2019-12-11 DIAGNOSIS — N185 Chronic kidney disease, stage 5: Secondary | ICD-10-CM | POA: Diagnosis not present

## 2019-12-25 DIAGNOSIS — E7800 Pure hypercholesterolemia, unspecified: Secondary | ICD-10-CM | POA: Diagnosis not present

## 2019-12-25 DIAGNOSIS — I509 Heart failure, unspecified: Secondary | ICD-10-CM | POA: Diagnosis not present

## 2019-12-25 DIAGNOSIS — I10 Essential (primary) hypertension: Secondary | ICD-10-CM | POA: Diagnosis not present

## 2020-01-15 ENCOUNTER — Inpatient Hospital Stay (HOSPITAL_COMMUNITY): Payer: Medicare Other | Attending: Hematology

## 2020-01-15 ENCOUNTER — Other Ambulatory Visit: Payer: Self-pay

## 2020-01-15 ENCOUNTER — Inpatient Hospital Stay (HOSPITAL_COMMUNITY): Payer: Medicare Other

## 2020-01-15 ENCOUNTER — Inpatient Hospital Stay (HOSPITAL_BASED_OUTPATIENT_CLINIC_OR_DEPARTMENT_OTHER): Payer: Medicare Other | Admitting: Nurse Practitioner

## 2020-01-15 DIAGNOSIS — Z841 Family history of disorders of kidney and ureter: Secondary | ICD-10-CM | POA: Insufficient documentation

## 2020-01-15 DIAGNOSIS — N185 Chronic kidney disease, stage 5: Secondary | ICD-10-CM | POA: Insufficient documentation

## 2020-01-15 DIAGNOSIS — Z90722 Acquired absence of ovaries, bilateral: Secondary | ICD-10-CM | POA: Diagnosis not present

## 2020-01-15 DIAGNOSIS — C541 Malignant neoplasm of endometrium: Secondary | ICD-10-CM | POA: Diagnosis not present

## 2020-01-15 DIAGNOSIS — Z79899 Other long term (current) drug therapy: Secondary | ICD-10-CM | POA: Diagnosis not present

## 2020-01-15 DIAGNOSIS — D631 Anemia in chronic kidney disease: Secondary | ICD-10-CM

## 2020-01-15 DIAGNOSIS — Z833 Family history of diabetes mellitus: Secondary | ICD-10-CM | POA: Diagnosis not present

## 2020-01-15 DIAGNOSIS — F17210 Nicotine dependence, cigarettes, uncomplicated: Secondary | ICD-10-CM | POA: Diagnosis not present

## 2020-01-15 DIAGNOSIS — N189 Chronic kidney disease, unspecified: Secondary | ICD-10-CM

## 2020-01-15 DIAGNOSIS — E538 Deficiency of other specified B group vitamins: Secondary | ICD-10-CM | POA: Insufficient documentation

## 2020-01-15 LAB — CBC WITH DIFFERENTIAL/PLATELET
Abs Immature Granulocytes: 0.03 10*3/uL (ref 0.00–0.07)
Basophils Absolute: 0 10*3/uL (ref 0.0–0.1)
Basophils Relative: 0 %
Eosinophils Absolute: 0.3 10*3/uL (ref 0.0–0.5)
Eosinophils Relative: 4 %
HCT: 33.8 % — ABNORMAL LOW (ref 36.0–46.0)
Hemoglobin: 10.5 g/dL — ABNORMAL LOW (ref 12.0–15.0)
Immature Granulocytes: 0 %
Lymphocytes Relative: 14 %
Lymphs Abs: 1.2 10*3/uL (ref 0.7–4.0)
MCH: 31.5 pg (ref 26.0–34.0)
MCHC: 31.1 g/dL (ref 30.0–36.0)
MCV: 101.5 fL — ABNORMAL HIGH (ref 80.0–100.0)
Monocytes Absolute: 0.4 10*3/uL (ref 0.1–1.0)
Monocytes Relative: 5 %
Neutro Abs: 6.4 10*3/uL (ref 1.7–7.7)
Neutrophils Relative %: 77 %
Platelets: 208 10*3/uL (ref 150–400)
RBC: 3.33 MIL/uL — ABNORMAL LOW (ref 3.87–5.11)
RDW: 13.3 % (ref 11.5–15.5)
WBC: 8.4 10*3/uL (ref 4.0–10.5)
nRBC: 0 % (ref 0.0–0.2)

## 2020-01-15 LAB — COMPREHENSIVE METABOLIC PANEL
ALT: 13 U/L (ref 0–44)
AST: 21 U/L (ref 15–41)
Albumin: 3.4 g/dL — ABNORMAL LOW (ref 3.5–5.0)
Alkaline Phosphatase: 60 U/L (ref 38–126)
Anion gap: 9 (ref 5–15)
BUN: 34 mg/dL — ABNORMAL HIGH (ref 8–23)
CO2: 21 mmol/L — ABNORMAL LOW (ref 22–32)
Calcium: 8.7 mg/dL — ABNORMAL LOW (ref 8.9–10.3)
Chloride: 109 mmol/L (ref 98–111)
Creatinine, Ser: 3.81 mg/dL — ABNORMAL HIGH (ref 0.44–1.00)
GFR calc Af Amer: 14 mL/min — ABNORMAL LOW (ref >60)
GFR calc non Af Amer: 12 mL/min — ABNORMAL LOW (ref >60)
Glucose, Bld: 102 mg/dL — ABNORMAL HIGH (ref 70–99)
Potassium: 4.1 mmol/L (ref 3.5–5.1)
Sodium: 139 mmol/L (ref 135–145)
Total Bilirubin: 0.5 mg/dL (ref 0.3–1.2)
Total Protein: 6.4 g/dL — ABNORMAL LOW (ref 6.5–8.1)

## 2020-01-15 LAB — FERRITIN: Ferritin: 245 ng/mL (ref 11–307)

## 2020-01-15 LAB — IRON AND TIBC
Iron: 53 ug/dL (ref 28–170)
Saturation Ratios: 25 % (ref 10.4–31.8)
TIBC: 212 ug/dL — ABNORMAL LOW (ref 250–450)
UIBC: 159 ug/dL

## 2020-01-15 LAB — LACTATE DEHYDROGENASE: LDH: 160 U/L (ref 98–192)

## 2020-01-15 LAB — VITAMIN B12: Vitamin B-12: 258 pg/mL (ref 180–914)

## 2020-01-15 LAB — VITAMIN D 25 HYDROXY (VIT D DEFICIENCY, FRACTURES): Vit D, 25-Hydroxy: 61.52 ng/mL (ref 30–100)

## 2020-01-15 MED ORDER — EPOETIN ALFA-EPBX 40000 UNIT/ML IJ SOLN: 40000.0000 [IU] | Freq: Once | INTRAMUSCULAR | Status: DC

## 2020-01-15 NOTE — Assessment & Plan Note (Signed)
1.  Anemia: - This is due to CKD. - Patient is on Aranesp which was started 11/10/2015.  Currently on Aranesp 150 mcg every 28 days.  Last dose was on flow 09/15/2018 - Last colonoscopy was reportedly done in [place], around 7 years ago. - It is recorded that she has a history of AVMs and esophageal varices. -She does have a right forearm fistula has not started dialysis yet. - She will continue Aranesp every 12 week for hemoglobin less than 10.0 g/dL as per nephrology recommendations. -Patient had a negative SPEP. -It has been 10 months since she has needed an Aranesp injection due to her keeping her hemoglobin above 10. -We will continue checking labs and giving injections every 12 weeks. -Labs done on 01/15/2020 showed hemoglobin 10.5 -She does not need her injection today - Follow-up in 3 months with labs and injection.   Vitamin B12 deficiency: - Patient initially had borderline low B12 at 206. -We placed her on vitamin B12 sublingual 1 mg daily. - Today labs on 10/16/2019 showed B12 level was at 310 -We will recheck labs next visit.

## 2020-01-15 NOTE — Progress Notes (Signed)
Shannon [name], Shannon [name] [id number]   CLINIC:  Medical Oncology/Hematology  PCP:  [name], MD [place]  [id number] [phone]   REASON FOR VISIT: Follow-up for anemia due to CKD   CURRENT THERAPY: Aranesp  BRIEF ONCOLOGIC HISTORY:  Oncology History Overview Note  MSI-stable   Endometrial/uterine adenocarcinoma ([place])  08/13/2019 Initial Biopsy   CKC and D&C - CKC negativ, EMB showed FIGO gr1 EMCA   08/13/2019 Initial Diagnosis   Endometrial/uterine adenocarcinoma (Shannon [name])   09/24/2019 Surgery   TRH/BSO, SLN   09/24/2019 Pathology Results   IA, grade 1, endometrioid adenoca; DOI 1/36m, no LVSI, SLNs negative   09/24/2019 Cancer Staging   Staging form: Corpus Uteri - Carcinoma and Carcinosarcoma, AJCC 8th Edition - Clinical stage from 09/24/2019: FIGO Stage IA (cT1a, cN0(sn), cM0) - Signed by [name] MD on 09/29/2019     CANCER STAGING: Cancer Staging Endometrial/uterine adenocarcinoma ([facility] Staging form: Corpus Uteri - Carcinoma and Carcinosarcoma, AJCC 8th Edition - Clinical stage from 09/24/2019: FIGO Stage IA (cT1a, cN0(sn), cM0) - Signed by [name] MD on 09/29/2019    INTERVAL HISTORY:  [name] female returns for routine follow-up for anemia due to CKD.  Patient reports she is doing well since her last visit.  She denies any overt bleeding per rectum or melena.  She denies any easy bruising or bleeding. Denies any nausea, vomiting, or diarrhea. Denies any new pains. Had not noticed any recent bleeding such as epistaxis, hematuria or hematochezia. Denies recent chest pain on exertion, shortness of breath on minimal exertion, pre-syncopal episodes, or palpitations. Denies any numbness or tingling in hands or feet. Denies any recent fevers, infections, or recent hospitalizations. Patient reports appetite at 100% and energy level at 100%.     REVIEW OF SYSTEMS:  Review of Systems  All  other systems reviewed and are negative.    PAST MEDICAL/SURGICAL HISTORY:  Past Medical History:  Diagnosis Date   Absolute anemia 11/10/2015   Anemia    Anemia of renal disease 11/10/2015   Asthma    has Albuterol inhaler prn   [place] ([place]estive heart failure) [place] [facility]    acute systolic [facility] 86/5784([facility]   Chronic kidney disease    Membran[facility] nephropathy ( [facility]   Endometrial adenocarcinoma (Shannon [name]    Gastric ulcer    History of blood transfusion    no abnormal reaction noted   History of bronchitis    last time 3-455yrago   Hyperlipidemia    takes Pravastatin daily   Hypertension    takes Imdur and Hydralazine daily as well as Coreg   Hypothyroidism    takes Synthroid daily   Itching    on legs-has a Kenalog cream   Metabolic bone disease    Nausea    takes Zofran prn   Nonischemic cardiomyopathy (Shannon [name]   12/2011 normal coronaries, EF 22% (EF normal 04/2013)   Obesity    Peripheral edema    takes Torsemide daily   Thyroid disease    hypothyroidism   Past Surgical History:  Procedure Laterality Date   AV FISTULA PLACEMENT Left 07/22/2012   Procedure: INSERTION OF ARTERIOVENOUS GORE-TEX GRAFT ARM;  Surgeon: ChAngelia MouldMD;  [facility] Service: [facility];  Laterality: Left;   AV FISTULA PLACEMENT Right 05/24/2017   Procedure: INSERTION OF ARTERIOVENOUS (AV) GORE-TEX GRAFT RIGHT ARM;  Surgeon: DiAngelia MouldMD;  [facility];  Service: Vascular;  Laterality: Right;   CESAREAN SECTION     30+yrs ago   COLONOSCOPY     DILATION AND CURETTAGE, DIAGNOSTIC / THERAPEUTIC     left arm surgery with pin     15+yrs ago   ROBOTIC ASSISTED TOTAL HYSTERECTOMY WITH BILATERAL SALPINGO OOPHERECTOMY Bilateral 09/24/2019   Procedure: XI ROBOTIC ASSISTED TOTAL HYSTERECTOMY WITH BILATERAL SALPINGO OOPHORECTOMY;  Surgeon: [name], MD;  [facility]: [facility];  Service: Gynecology;  Laterality: Bilateral;   SENTINEL NODE  BIOPSY N/A 09/24/2019   Procedure: SENTINEL NODE BIOPSY;  Surgeon: [name], MD;  Location: [facility];  Service: Gynecology;  Laterality: N/A;   TUBAL LIGATION       SOCIAL HISTORY:  Social History   Socioeconomic History   Marital status: Single    Spouse name: Not on file   Number of children: Not on file   Years of education: Not on file   Highest education level: Not on file  Occupational History   Not on file  Tobacco Use   Smoking status: Current Every Day Smoker    Packs/day: 1.00    Years: 40.00    Pack years: 40.00    Types: Cigarettes    Start date: 05/14/1968   Smokeless tobacco: Never Used  Vaping Use   Vaping Use: Never used  Substance and Sexual Activity   Alcohol use: No   Drug use: No   Sexual activity: Not Currently  Other Topics Concern   Not on file  Social History Narrative   Not on file   Social Determinants of Health   Financial Resource Strain:    Difficulty of Paying Living Expenses: Not on file  Food Insecurity:    Worried About Charity fundraiser in the Last Year: Not on file   [employer] of Food in the Last Year: Not on file  Transportation Needs:    Lack of Transportation (Medical): Not on file   Lack of Transportation (Non-Medical): Not on file  Physical Activity:    Days of Exercise per Week: Not on file   Minutes of Exercise per Session: Not on file  Stress:    Feeling of Stress : Not on file  Social Connections:    Frequency of Communication with Friends and Family: Not on file   Frequency of Social Gatherings with Friends and Family: Not on file   Attends Religious Services: Not on file   Active Member of Clubs or Organizations: Not on file   Attends Archivist Meetings: Not on file   Marital Status: Not on file  Intimate Partner Violence:    Fear of Current or Ex-Partner: Not on file   Emotionally Abused: Not on file   Physically Abused: Not on file   Sexually Abused: Not on  file    FAMILY HISTORY:  Family History  Problem Relation Age of Onset   Kidney disease Other    Diabetes Mother    Breast cancer Neg Hx    Colon cancer Neg Hx    Uterine cancer Neg Hx    Ovarian cancer Neg Hx     CURRENT MEDICATIONS:  Outpatient Encounter Medications as of 01/15/2020  Medication Sig   acetaminophen (TYLENOL) 500 MG tablet Take 1,000 mg by mouth daily as needed for moderate pain or headache.   albuterol (PROVENTIL HFA;VENTOLIN HFA) 108 (90 BASE) MCG/ACT inhaler Inhale 1-2 puffs into the lungs every 6 (six) hours as needed for wheezing.  augmented betamethasone dipropionate (DIPROLENE-AF) 0.05 % cream Apply 1 application topically 2 (two) times daily as needed (skin irritation.).    carvedilol (COREG) 25 MG tablet Take 25 mg by mouth 2 (two) times daily.    clobetasol cream (TEMOVATE) 5.70 % Apply 1 application topically daily as needed (rash).    hydrALAZINE (APRESOLINE) 50 MG tablet Take 50 mg by mouth 3 (three) times daily.    isosorbide mononitrate (IMDUR) 30 MG 24 hr tablet Take 30 mg by mouth daily.   metolazone (ZAROXOLYN) 5 MG tablet Take 5 mg by mouth 2 (two) times daily as needed (swelling).    pravastatin (PRAVACHOL) 80 MG tablet Take 80 mg by mouth daily.   raloxifene (EVISTA) 60 MG tablet Take 60 mg by mouth daily.   RAYALDEE 30 MCG CPCR Take 30 mcg by mouth daily.    senna-docusate (SENOKOT-S) 8.6-50 MG tablet Take 2 tablets by mouth at bedtime. Do not take if having diarrhea   sodium bicarbonate 650 MG tablet Take 1,300 mg by mouth 3 (three) times daily.    torsemide (DEMADEX) 20 MG tablet Take 20 mg by mouth daily as needed (swelling).    [DISCONTINUED] acetaminophen-codeine (TYLENOL #3) 300-30 MG tablet Take 1 tablet by mouth every 4 (four) hours as needed.   oxyCODONE (OXY IR/ROXICODONE) 5 MG immediate release tablet Take 1 tablet (5 mg total) by mouth every 6 (six) hours as needed for severe pain. Do not take and drive (Patient  not taking: Reported on 10/16/2019)   No facility-administered encounter medications on file as of 01/15/2020.    ALLERGIES:  Allergies  Allergen Reactions   Corticosteroids Hives, Itching, Nausea And Vomiting and Rash    Tolerated dexamethasone when being treated for a reaction to rituximab   Other Hives, Itching, Nausea And Vomiting and Rash    Tolerated dexamethasone when being treated for a reaction to rituximab Tolerated dexamethasone when being treated for a reaction to rituximab  Tolerated dexamethasone when being treated for a reaction to rituximab   Rituximab Shortness Of Breath    wheezing wheezing      PHYSICAL EXAM:  ECOG Performance status: 1  Vitals:   01/15/20 1043  BP: (!) 111/53  Pulse: 62  Resp: 16  Temp: 97.9 F (36.6 C)  SpO2: 100%   Filed Weights   01/15/20 1043  Weight: 229 lb 11.5 oz (104.2 kg)   Physical Exam Constitutional:      Appearance: Normal appearance. She is normal weight.  Cardiovascular:     Rate and Rhythm: Normal rate and regular rhythm.     Heart sounds: Normal heart sounds.  Pulmonary:     Effort: Pulmonary effort is normal.     Breath sounds: Normal breath sounds.  Abdominal:     General: Bowel sounds are normal.     Palpations: Abdomen is soft.  Musculoskeletal:        General: Normal range of motion.  Skin:    General: Skin is warm.  Neurological:     Mental Status: She is alert and oriented to person, place, and time. Mental status is at baseline.  Psychiatric:        Mood and Affect: Mood normal.        Behavior: Behavior normal.        Thought Content: Thought content normal.        Judgment: Judgment normal.      LABORATORY DATA:  I have reviewed the labs as listed.  CBC  Component Value Date/Time   WBC 8.4 01/15/2020 1020   RBC 3.33 (L) 01/15/2020 1020   HGB 10.5 (L) 01/15/2020 1020   HCT 33.8 (L) 01/15/2020 1020   PLT 208 01/15/2020 1020   MCV 101.5 (H) 01/15/2020 1020   MCH 31.5 01/15/2020  1020   MCHC 31.1 01/15/2020 1020   RDW 13.3 01/15/2020 1020   LYMPHSABS 1.2 01/15/2020 1020   MONOABS 0.4 01/15/2020 1020   EOSABS 0.3 01/15/2020 1020   BASOSABS 0.0 01/15/2020 1020   CMP Latest Ref Rng & Units 01/15/2020 10/16/2019 09/25/2019  Glucose 70 - 99 mg/dL 102(H) 100(H) 139(H)  BUN 8 - 23 mg/dL 34(H) 33(H) 33(H)  Creatinine 0.44 - 1.00 mg/dL 3.81(H) 3.63(H) 3.76(H)  Sodium 135 - 145 mmol/L 139 144 137  Potassium 3.5 - 5.1 mmol/L 4.1 4.3 4.6  Chloride 98 - 111 mmol/L 109 109 109  CO2 22 - 32 mmol/L 21(L) 26 19(L)  Calcium 8.9 - 10.3 mg/dL 8.7(L) 8.8(L) 8.6(L)  Total Protein 6.5 - 8.1 g/dL 6.4(L) 6.5 -  Total Bilirubin 0.3 - 1.2 mg/dL 0.5 0.4 -  Alkaline Phos 38 - 126 U/L 60 74 -  AST 15 - 41 U/L 21 13(L) -  ALT 0 - 44 U/L 13 11 -    All questions were answered to patient's stated satisfaction. Encouraged patient to call with any new concerns or questions before his next visit to the cancer center and we can certain see him sooner, if needed.     ASSESSMENT & PLAN:  Anemia of renal disease 1.  Anemia: - This is due to CKD. - Patient is on Aranesp which was started 11/10/2015.  Currently on Aranesp 150 mcg every 28 days.  Last dose was on flow 09/15/2018 - Last colonoscopy was reportedly done in [place], around 7 years ago. - It is recorded that she has a history of AVMs and esophageal varices. -She does have a right forearm fistula has not started dialysis yet. - She will continue Aranesp every 12 week for hemoglobin less than 10.0 g/dL as per nephrology recommendations. -Patient had a negative SPEP. -It has been 10 months since she has needed an Aranesp injection due to her keeping her hemoglobin above 10. -We will continue checking labs and giving injections every 12 weeks. -Labs done on 01/15/2020 showed hemoglobin 10.5 -She does not need her injection today - Follow-up in 3 months with labs and injection.   Vitamin B12 deficiency: - Patient initially had borderline low B12  at 206. -We placed her on vitamin B12 sublingual 1 mg daily. - Today labs on 10/16/2019 showed B12 level was at 310 -We will recheck labs next visit.     Orders placed this encounter:  Orders Placed This Encounter  Procedures   CBC with Differential/Platelet   Comprehensive metabolic panel      Francene Finders, FNP-C [place] [phone]

## 2020-01-15 NOTE — Progress Notes (Signed)
Pt here today for Retacrit injection. Injection HELD today per orders to hold if >10.

## 2020-01-25 DIAGNOSIS — I509 Heart failure, unspecified: Secondary | ICD-10-CM | POA: Diagnosis not present

## 2020-01-25 DIAGNOSIS — N185 Chronic kidney disease, stage 5: Secondary | ICD-10-CM | POA: Diagnosis not present

## 2020-01-25 DIAGNOSIS — F17210 Nicotine dependence, cigarettes, uncomplicated: Secondary | ICD-10-CM | POA: Diagnosis not present

## 2020-01-25 DIAGNOSIS — Z299 Encounter for prophylactic measures, unspecified: Secondary | ICD-10-CM | POA: Diagnosis not present

## 2020-01-25 DIAGNOSIS — I10 Essential (primary) hypertension: Secondary | ICD-10-CM | POA: Diagnosis not present

## 2020-02-01 DIAGNOSIS — I509 Heart failure, unspecified: Secondary | ICD-10-CM | POA: Diagnosis not present

## 2020-02-01 DIAGNOSIS — I10 Essential (primary) hypertension: Secondary | ICD-10-CM | POA: Diagnosis not present

## 2020-02-01 DIAGNOSIS — Z299 Encounter for prophylactic measures, unspecified: Secondary | ICD-10-CM | POA: Diagnosis not present

## 2020-02-01 DIAGNOSIS — N185 Chronic kidney disease, stage 5: Secondary | ICD-10-CM | POA: Diagnosis not present

## 2020-02-08 DIAGNOSIS — N185 Chronic kidney disease, stage 5: Secondary | ICD-10-CM | POA: Diagnosis not present

## 2020-02-11 DIAGNOSIS — I509 Heart failure, unspecified: Secondary | ICD-10-CM | POA: Diagnosis not present

## 2020-02-11 DIAGNOSIS — E7800 Pure hypercholesterolemia, unspecified: Secondary | ICD-10-CM | POA: Diagnosis not present

## 2020-02-11 DIAGNOSIS — I10 Essential (primary) hypertension: Secondary | ICD-10-CM | POA: Diagnosis not present

## 2020-02-12 DIAGNOSIS — D631 Anemia in chronic kidney disease: Secondary | ICD-10-CM | POA: Diagnosis not present

## 2020-02-12 DIAGNOSIS — N185 Chronic kidney disease, stage 5: Secondary | ICD-10-CM | POA: Diagnosis not present

## 2020-02-12 DIAGNOSIS — E211 Secondary hyperparathyroidism, not elsewhere classified: Secondary | ICD-10-CM | POA: Diagnosis not present

## 2020-02-12 DIAGNOSIS — R809 Proteinuria, unspecified: Secondary | ICD-10-CM | POA: Diagnosis not present

## 2020-03-11 DIAGNOSIS — E7800 Pure hypercholesterolemia, unspecified: Secondary | ICD-10-CM | POA: Diagnosis not present

## 2020-03-11 DIAGNOSIS — I10 Essential (primary) hypertension: Secondary | ICD-10-CM | POA: Diagnosis not present

## 2020-03-11 DIAGNOSIS — I509 Heart failure, unspecified: Secondary | ICD-10-CM | POA: Diagnosis not present

## 2020-03-28 ENCOUNTER — Other Ambulatory Visit (HOSPITAL_COMMUNITY)
Admission: RE | Admit: 2020-03-28 | Discharge: 2020-03-28 | Disposition: A | Discharge status: Home / Self Care | Payer: Medicare Other | Source: Ambulatory Visit | Attending: Nephrology | Admitting: Nephrology

## 2020-03-28 ENCOUNTER — Other Ambulatory Visit: Payer: Self-pay

## 2020-03-28 DIAGNOSIS — D631 Anemia in chronic kidney disease: Secondary | ICD-10-CM | POA: Insufficient documentation

## 2020-03-28 DIAGNOSIS — R809 Proteinuria, unspecified: Secondary | ICD-10-CM | POA: Insufficient documentation

## 2020-03-28 DIAGNOSIS — E211 Secondary hyperparathyroidism, not elsewhere classified: Secondary | ICD-10-CM | POA: Diagnosis not present

## 2020-03-28 DIAGNOSIS — N189 Chronic kidney disease, unspecified: Secondary | ICD-10-CM | POA: Insufficient documentation

## 2020-03-28 DIAGNOSIS — N185 Chronic kidney disease, stage 5: Secondary | ICD-10-CM | POA: Diagnosis not present

## 2020-03-28 LAB — CBC
HCT: 34.6 % — ABNORMAL LOW (ref 36.0–46.0)
Hemoglobin: 10.8 g/dL — ABNORMAL LOW (ref 12.0–15.0)
MCH: 32.1 pg (ref 26.0–34.0)
MCHC: 31.2 g/dL (ref 30.0–36.0)
MCV: 103 fL — ABNORMAL HIGH (ref 80.0–100.0)
Platelets: 226 10*3/uL (ref 150–400)
RBC: 3.36 MIL/uL — ABNORMAL LOW (ref 3.87–5.11)
RDW: 13.4 % (ref 11.5–15.5)
WBC: 6.6 10*3/uL (ref 4.0–10.5)
nRBC: 0 % (ref 0.0–0.2)

## 2020-03-28 LAB — RENAL FUNCTION PANEL
Albumin: 3.6 g/dL (ref 3.5–5.0)
Anion gap: 10 (ref 5–15)
BUN: 40 mg/dL — ABNORMAL HIGH (ref 8–23)
CO2: 19 mmol/L — ABNORMAL LOW (ref 22–32)
Calcium: 8.8 mg/dL — ABNORMAL LOW (ref 8.9–10.3)
Chloride: 111 mmol/L (ref 98–111)
Creatinine, Ser: 4.04 mg/dL — ABNORMAL HIGH (ref 0.44–1.00)
GFR, Estimated: 12 mL/min — ABNORMAL LOW (ref >60)
Glucose, Bld: 97 mg/dL (ref 70–99)
Phosphorus: 4.3 mg/dL (ref 2.5–4.6)
Potassium: 4.1 mmol/L (ref 3.5–5.1)
Sodium: 140 mmol/L (ref 135–145)

## 2020-03-28 LAB — IRON AND TIBC
Iron: 53 ug/dL (ref 28–170)
Saturation Ratios: 21 % (ref 10.4–31.8)
TIBC: 254 ug/dL (ref 250–450)
UIBC: 201 ug/dL

## 2020-03-28 LAB — VITAMIN D 25 HYDROXY (VIT D DEFICIENCY, FRACTURES): Vit D, 25-Hydroxy: 60.71 ng/mL (ref 30–100)

## 2020-03-28 LAB — FERRITIN: Ferritin: 306 ng/mL (ref 11–307)

## 2020-03-28 LAB — PROTEIN / CREATININE RATIO, URINE
Creatinine, Urine: 81.64 mg/dL
Protein Creatinine Ratio: 0.86 mg/mg{Cre} — ABNORMAL HIGH (ref 0.00–0.15)
Total Protein, Urine: 70 mg/dL

## 2020-03-29 LAB — PARATHYROID HORMONE, INTACT (NO CA): PTH: 70 pg/mL — ABNORMAL HIGH (ref 15–65)

## 2020-04-01 DIAGNOSIS — D631 Anemia in chronic kidney disease: Secondary | ICD-10-CM | POA: Diagnosis not present

## 2020-04-01 DIAGNOSIS — E872 Acidosis: Secondary | ICD-10-CM | POA: Diagnosis not present

## 2020-04-01 DIAGNOSIS — R809 Proteinuria, unspecified: Secondary | ICD-10-CM | POA: Diagnosis not present

## 2020-04-01 DIAGNOSIS — E211 Secondary hyperparathyroidism, not elsewhere classified: Secondary | ICD-10-CM | POA: Diagnosis not present

## 2020-04-01 DIAGNOSIS — N185 Chronic kidney disease, stage 5: Secondary | ICD-10-CM | POA: Diagnosis not present

## 2020-04-06 ENCOUNTER — Other Ambulatory Visit (HOSPITAL_COMMUNITY): Payer: Self-pay

## 2020-04-06 DIAGNOSIS — D508 Other iron deficiency anemias: Secondary | ICD-10-CM

## 2020-04-06 DIAGNOSIS — D631 Anemia in chronic kidney disease: Secondary | ICD-10-CM

## 2020-04-06 DIAGNOSIS — N189 Chronic kidney disease, unspecified: Secondary | ICD-10-CM

## 2020-04-11 ENCOUNTER — Ambulatory Visit (HOSPITAL_COMMUNITY): Payer: Medicare Other | Admitting: Hematology

## 2020-04-11 ENCOUNTER — Inpatient Hospital Stay (HOSPITAL_COMMUNITY): Payer: Medicare Other | Attending: Hematology

## 2020-04-11 ENCOUNTER — Other Ambulatory Visit: Payer: Self-pay

## 2020-04-11 ENCOUNTER — Inpatient Hospital Stay (HOSPITAL_COMMUNITY): Payer: Medicare Other

## 2020-04-11 ENCOUNTER — Encounter (HOSPITAL_COMMUNITY): Payer: Self-pay

## 2020-04-11 DIAGNOSIS — D508 Other iron deficiency anemias: Secondary | ICD-10-CM

## 2020-04-11 DIAGNOSIS — D631 Anemia in chronic kidney disease: Secondary | ICD-10-CM

## 2020-04-11 DIAGNOSIS — N189 Chronic kidney disease, unspecified: Secondary | ICD-10-CM | POA: Diagnosis not present

## 2020-04-11 LAB — CBC WITH DIFFERENTIAL/PLATELET
Abs Immature Granulocytes: 0.02 10*3/uL (ref 0.00–0.07)
Basophils Absolute: 0 10*3/uL (ref 0.0–0.1)
Basophils Relative: 0 %
Eosinophils Absolute: 0.3 10*3/uL (ref 0.0–0.5)
Eosinophils Relative: 4 %
HCT: 35 % — ABNORMAL LOW (ref 36.0–46.0)
Hemoglobin: 10.8 g/dL — ABNORMAL LOW (ref 12.0–15.0)
Immature Granulocytes: 0 %
Lymphocytes Relative: 22 %
Lymphs Abs: 1.7 10*3/uL (ref 0.7–4.0)
MCH: 32 pg (ref 26.0–34.0)
MCHC: 30.9 g/dL (ref 30.0–36.0)
MCV: 103.9 fL — ABNORMAL HIGH (ref 80.0–100.0)
Monocytes Absolute: 0.4 10*3/uL (ref 0.1–1.0)
Monocytes Relative: 5 %
Neutro Abs: 5.4 10*3/uL (ref 1.7–7.7)
Neutrophils Relative %: 69 %
Platelets: 216 10*3/uL (ref 150–400)
RBC: 3.37 MIL/uL — ABNORMAL LOW (ref 3.87–5.11)
RDW: 13.2 % (ref 11.5–15.5)
WBC: 7.8 10*3/uL (ref 4.0–10.5)
nRBC: 0 % (ref 0.0–0.2)

## 2020-04-11 LAB — COMPREHENSIVE METABOLIC PANEL
ALT: 14 U/L (ref 0–44)
AST: 17 U/L (ref 15–41)
Albumin: 3.5 g/dL (ref 3.5–5.0)
Alkaline Phosphatase: 73 U/L (ref 38–126)
Anion gap: 10 (ref 5–15)
BUN: 36 mg/dL — ABNORMAL HIGH (ref 8–23)
CO2: 21 mmol/L — ABNORMAL LOW (ref 22–32)
Calcium: 8.9 mg/dL (ref 8.9–10.3)
Chloride: 108 mmol/L (ref 98–111)
Creatinine, Ser: 3.87 mg/dL — ABNORMAL HIGH (ref 0.44–1.00)
GFR, Estimated: 12 mL/min — ABNORMAL LOW (ref >60)
Glucose, Bld: 85 mg/dL (ref 70–99)
Potassium: 3.9 mmol/L (ref 3.5–5.1)
Sodium: 139 mmol/L (ref 135–145)
Total Bilirubin: 0.4 mg/dL (ref 0.3–1.2)
Total Protein: 7.2 g/dL (ref 6.5–8.1)

## 2020-04-11 NOTE — Progress Notes (Signed)
Labs reviewed with the patient and parameters.  No injection needed today.  Patient given a copy and to return for next appointment.

## 2020-04-12 DIAGNOSIS — I509 Heart failure, unspecified: Secondary | ICD-10-CM | POA: Diagnosis not present

## 2020-04-12 DIAGNOSIS — E7800 Pure hypercholesterolemia, unspecified: Secondary | ICD-10-CM | POA: Diagnosis not present

## 2020-04-12 DIAGNOSIS — I10 Essential (primary) hypertension: Secondary | ICD-10-CM | POA: Diagnosis not present

## 2020-04-19 DIAGNOSIS — Z0000 Encounter for general adult medical examination without abnormal findings: Secondary | ICD-10-CM | POA: Diagnosis not present

## 2020-04-19 DIAGNOSIS — I10 Essential (primary) hypertension: Secondary | ICD-10-CM | POA: Diagnosis not present

## 2020-04-19 DIAGNOSIS — Z7189 Other specified counseling: Secondary | ICD-10-CM | POA: Diagnosis not present

## 2020-04-19 DIAGNOSIS — Z79899 Other long term (current) drug therapy: Secondary | ICD-10-CM | POA: Diagnosis not present

## 2020-04-19 DIAGNOSIS — E7800 Pure hypercholesterolemia, unspecified: Secondary | ICD-10-CM | POA: Diagnosis not present

## 2020-04-19 DIAGNOSIS — Z299 Encounter for prophylactic measures, unspecified: Secondary | ICD-10-CM | POA: Diagnosis not present

## 2020-04-19 DIAGNOSIS — Z1211 Encounter for screening for malignant neoplasm of colon: Secondary | ICD-10-CM | POA: Diagnosis not present

## 2020-04-19 DIAGNOSIS — R5383 Other fatigue: Secondary | ICD-10-CM | POA: Diagnosis not present

## 2020-04-21 NOTE — Progress Notes (Signed)
Gynecologic Oncology Return Clinic Visit  04/22/20  Reason for Visit: surveillance in the setting of low-risk uterine cancer  Treatment History: Oncology History Overview Note  MSI-stable   Endometrial/uterine adenocarcinoma (Shannon [name])  08/13/2019 Initial Biopsy   CKC and D&C - CKC negativ, EMB showed FIGO gr1 EMCA   08/13/2019 Initial Diagnosis   Endometrial/uterine adenocarcinoma (Shannon [name])   09/24/2019 Surgery   TRH/BSO, SLN   09/24/2019 Pathology Results   IA, grade 1, endometrioid adenoca; DOI 1/1m, no LVSI, SLNs negative   09/24/2019 Cancer Staging   Staging form: Corpus Uteri - Carcinoma and Carcinosarcoma, AJCC 8th Edition - Clinical stage from 09/24/2019: FIGO Stage IA (cT1a, cN0(sn), cM0) - Signed by [name] MD on 09/29/2019     Interval History: Patient presents today for follow-up.  Overall doing well.  Denies any vaginal bleeding or discharge since her last visit.  Reports regular bowel function.  Saw her nephrologist within the last couple of weeks, kidney function remained stable.  She thinks her weight is up some.  Endorses a good appetite without any nausea or emesis.  Shannon [name] Thanksgiving with her family, did a potluck style dinner.  Past Medical/Surgical History: Past Medical History:  Diagnosis Date  . Absolute anemia 11/10/2015  . Anemia   . Anemia of renal disease 11/10/2015  . Asthma    has Albuterol inhaler prn  . [place]) [place]    acute systolic CHF 82/5956([facility]  . Chronic kidney disease    [facility]  . [place]ometrial adenocarcinoma ([facility]   . Gastric ulcer   . History of blood transfusion    no abnormal reaction noted  . History of bronchitis    last time 3-49yrago  . Hyperlipidemia    takes Pravastatin daily  . Hypertension    takes Imdur and Hydralazine daily as well as Coreg  . Hypothyroidism    takes Synthroid daily  . Itching    on legs-has a Kenalog cream  . Metabolic bone  disease   . Nausea    takes Zofran prn  . Nonischemic cardiomyopathy ([place]   12/2011 normal coronaries, EF 22% (EF normal 04/2013)  . Obesity   . Peripheral edema    takes Torsemide daily  . Thyroid disease    hypothyroidism    Past Surgical History:  Procedure Laterality Date  . AV FISTULA PLACEMENT Left 07/22/2012   Procedure: INSERTION OF ARTERIOVENOUS GORE-TEX GRAFT ARM;  Surgeon: ChAngelia MouldMD;  [facility]: [facility];  Laterality: Left;  . AV FISTULA PLACEMENT Right 05/24/2017   Procedure: INSERTION OF ARTERIOVENOUS (AV) GORE-TEX GRAFT RIGHT ARM;  Surgeon: DiAngelia MouldMD;  [facility]: [facility];  Laterality: Right;  . CESAREAN SECTION     30+yrs ago  . COLONOSCOPY    . DILATION AND CURETTAGE, DIAGNOSTIC / THERAPEUTIC    . left arm surgery with pin     15+yrs ago  . ROBOTIC ASSISTED TOTAL HYSTERECTOMY WITH BILATERAL SALPINGO OOPHERECTOMY Bilateral 09/24/2019   Procedure: XI ROBOTIC ASSISTED TOTAL HYSTERECTOMY WITH BILATERAL SALPINGO OOPHORECTOMY;  Surgeon: TuLafonda MossesMD;  [facility];  Service: Gynecology;  Laterality: Bilateral;  . SENTINEL NODE BIOPSY N/A 09/24/2019   Procedure: SENTINEL NODE BIOPSY;  Surgeon: TuLafonda MossesMD;  [facility];  Service: Gynecology;  Laterality: N/A;  . TUBAL LIGATION      Family History  Problem Relation Age of Onset  .  Kidney disease Other   . Diabetes Mother   . Breast cancer Neg Hx   . Colon cancer Neg Hx   . Uterine cancer Neg Hx   . Ovarian cancer Neg Hx     Social History   Socioeconomic History  . Marital status: Single    Spouse name: Not on file  . Number of children: Not on file  . Years of education: Not on file  . Highest education level: Not on file  Occupational History  . Not on file  Tobacco Use  . Smoking status: Current Every Day Smoker    Packs/day: 1.00    Years: 40.00    Pack years: 40.00    Types: Cigarettes    Start date:  05/14/1968  . Smokeless tobacco: Never Used  Vaping Use  . Vaping Use: Never used  Substance and Sexual Activity  . Alcohol use: No  . Drug use: No  . Sexual activity: Not Currently  Other Topics Concern  . Not on file  Social History Narrative  . Not on file   Social Determinants of Health   Financial Resource Strain: Not on file  Food Insecurity: Not on file  Transportation Needs: Not on file  Physical Activity: Not on file  Stress: Not on file  Social Connections: Not on file    Current Medications:  Current Outpatient Medications:  .  acetaminophen (TYLENOL) 500 MG tablet, Take 1,000 mg by mouth daily as needed for moderate pain or headache., Disp: , Rfl:  .  albuterol (PROVENTIL HFA;VENTOLIN HFA) 108 (90 BASE) MCG/ACT inhaler, Inhale 1-2 puffs into the lungs every 6 (six) hours as needed for wheezing. , Disp: , Rfl:  .  augmented betamethasone dipropionate (DIPROLENE-AF) 0.05 % cream, Apply 1 application topically 2 (two) times daily as needed (skin irritation.). , Disp: , Rfl:  .  clobetasol cream (TEMOVATE) 1.74 %, Apply 1 application topically daily as needed (rash). , Disp: , Rfl:  .  hydrALAZINE (APRESOLINE) 50 MG tablet, Take 50 mg by mouth 3 (three) times daily. , Disp: , Rfl:  .  isosorbide mononitrate (IMDUR) 30 MG 24 hr tablet, Take 30 mg by mouth daily., Disp: , Rfl:  .  metolazone (ZAROXOLYN) 5 MG tablet, Take 5 mg by mouth 2 (two) times daily as needed (swelling). , Disp: , Rfl:  .  pravastatin (PRAVACHOL) 80 MG tablet, Take 80 mg by mouth daily., Disp: , Rfl:  .  raloxifene (EVISTA) 60 MG tablet, Take 60 mg by mouth daily., Disp: , Rfl:  .  RAYALDEE 30 MCG CPCR, Take 30 mcg by mouth daily. , Disp: , Rfl:  .  sodium bicarbonate 650 MG tablet, Take 1,300 mg by mouth 3 (three) times daily. , Disp: , Rfl:  .  torsemide (DEMADEX) 20 MG tablet, Take 20 mg by mouth daily as needed (swelling). , Disp: , Rfl:  .  carvedilol (COREG) 12.5 MG tablet, Take 12.5 mg by mouth 2  (two) times daily., Disp: , Rfl:   Review of Systems: + fatigue, itching, rash. Denies appetite changes, fevers, chills. Denies hearing loss, neck lumps or masses, mouth sores, ringing in ears or voice changes. Denies cough or wheezing.  Denies shortness of breath. Denies chest pain or palpitations. Denies leg swelling. Denies abdominal distention, pain, blood in stools, constipation, diarrhea, nausea, vomiting, or early satiety. Denies pain with intercourse, dysuria, frequency, hematuria or incontinence. Denies hot flashes, pelvic pain, vaginal bleeding or vaginal discharge.   Denies joint pain, back  pain or muscle pain/cramps. Denies dizziness, headaches, numbness or seizures. Denies swollen lymph nodes or glands, denies easy bruising or bleeding. Denies anxiety, depression, confusion, or decreased concentration.  Physical Exam: BP (!) 155/64 (BP Location: Left Arm, Patient Position: Sitting)   Pulse 62   Temp (!) 96.4 F (35.8 C) (Tympanic)   Resp 18   Wt 223 lb 12.8 oz (101.5 kg)   SpO2 98%   BMI 39.64 kg/m  General: Alert, oriented, no acute distress. HEENT: Normocephalic, atraumatic, sclera anicteric. Chest: Unlabored breathing on room air. Abdomen: Obese, soft, nontender.  Normoactive bowel sounds.  No masses or hepatosplenomegaly appreciated.  Well-healed incisions. Extremities: Grossly normal range of motion.  Warm, well perfused.  1+ edema bilaterally, excoriations on both anterior shins. Lymphatics: No cervical, supraclavicular, or inguinal adenopathy. GU: Normal appearing external genitalia without erythema, excoriation, or lesions.  Speculum exam reveals moderately atrophic vaginal mucosa, especially at the cuff.  No lesions or masses, no bleeding.  Bimanual exam reveals cuff intact, no nodularity or masses appreciated.  Rectovaginal exam confirms these findings.  Laboratory & Radiologic Studies: None new  Assessment & Plan: Shannon [name] is a 64 y.o. woman  with Stage 1A, grade 1 low risk, early stage endometrioid adenocarcinoma who presents for surveillance.  Patient is NED on exam today.  Despite thinking she had gained weight, she is actually lost some weight since June.  Patient was given her survivorship plan today.  Per [facility] surveillance recommendations, I discussed surveillance visits every 6 months for the first year and then yearly until 5 years from diagnosis.  We discussed again that a recurrence would be most common at the vaginal cuff. We discussed the signs and symptoms that would be concerning for disease recurrence that should prompt a phone call prior to her next visit.  25 minutes of total time was spent for this patient encounter, including preparation, face-to-face counseling with the patient and coordination of care, and documentation of the encounter.  Jeral Pinch, MD  Division of Gynecologic Oncology  Department of Obstetrics and Gynecology  [facility]

## 2020-04-22 ENCOUNTER — Encounter: Payer: Self-pay | Admitting: Gynecologic Oncology

## 2020-04-22 ENCOUNTER — Other Ambulatory Visit: Payer: Self-pay

## 2020-04-22 ENCOUNTER — Inpatient Hospital Stay: Payer: Medicare Other | Attending: Gynecologic Oncology | Admitting: Gynecologic Oncology

## 2020-04-22 VITALS — BP 155/64 | HR 62 | Temp 96.4°F | Resp 18 | Wt 223.8 lb

## 2020-04-22 DIAGNOSIS — N189 Chronic kidney disease, unspecified: Secondary | ICD-10-CM | POA: Insufficient documentation

## 2020-04-22 DIAGNOSIS — Z90722 Acquired absence of ovaries, bilateral: Secondary | ICD-10-CM | POA: Insufficient documentation

## 2020-04-22 DIAGNOSIS — I509 Heart failure, unspecified: Secondary | ICD-10-CM | POA: Diagnosis not present

## 2020-04-22 DIAGNOSIS — F17210 Nicotine dependence, cigarettes, uncomplicated: Secondary | ICD-10-CM | POA: Diagnosis not present

## 2020-04-22 DIAGNOSIS — E785 Hyperlipidemia, unspecified: Secondary | ICD-10-CM | POA: Diagnosis not present

## 2020-04-22 DIAGNOSIS — Z08 Encounter for follow-up examination after completed treatment for malignant neoplasm: Secondary | ICD-10-CM | POA: Diagnosis not present

## 2020-04-22 DIAGNOSIS — Z6839 Body mass index (BMI) 39.0-39.9, adult: Secondary | ICD-10-CM | POA: Diagnosis not present

## 2020-04-22 DIAGNOSIS — Z79899 Other long term (current) drug therapy: Secondary | ICD-10-CM | POA: Diagnosis not present

## 2020-04-22 DIAGNOSIS — I130 Hypertensive heart and chronic kidney disease with heart failure and stage 1 through stage 4 chronic kidney disease, or unspecified chronic kidney disease: Secondary | ICD-10-CM | POA: Insufficient documentation

## 2020-04-22 DIAGNOSIS — Z90710 Acquired absence of both cervix and uterus: Secondary | ICD-10-CM | POA: Diagnosis not present

## 2020-04-22 DIAGNOSIS — E669 Obesity, unspecified: Secondary | ICD-10-CM | POA: Insufficient documentation

## 2020-04-22 DIAGNOSIS — D631 Anemia in chronic kidney disease: Secondary | ICD-10-CM | POA: Insufficient documentation

## 2020-04-22 DIAGNOSIS — Z8542 Personal history of malignant neoplasm of other parts of uterus: Secondary | ICD-10-CM | POA: Diagnosis not present

## 2020-04-22 DIAGNOSIS — E039 Hypothyroidism, unspecified: Secondary | ICD-10-CM | POA: Diagnosis not present

## 2020-04-22 DIAGNOSIS — C541 Malignant neoplasm of endometrium: Secondary | ICD-10-CM

## 2020-04-22 DIAGNOSIS — I428 Other cardiomyopathies: Secondary | ICD-10-CM | POA: Diagnosis not present

## 2020-04-22 NOTE — Patient Instructions (Signed)
Great to see you today.  Your exam shows no evidence of cancer.  If you develop any new symptoms like pelvic pain or vaginal bleeding before your next visit with me in 6 months, please call the clinic at [phone] to be seen sooner.

## 2020-05-10 DIAGNOSIS — F17210 Nicotine dependence, cigarettes, uncomplicated: Secondary | ICD-10-CM | POA: Diagnosis not present

## 2020-05-10 DIAGNOSIS — I509 Heart failure, unspecified: Secondary | ICD-10-CM | POA: Diagnosis not present

## 2020-05-10 DIAGNOSIS — Z299 Encounter for prophylactic measures, unspecified: Secondary | ICD-10-CM | POA: Diagnosis not present

## 2020-05-10 DIAGNOSIS — J069 Acute upper respiratory infection, unspecified: Secondary | ICD-10-CM | POA: Diagnosis not present

## 2020-05-10 DIAGNOSIS — J40 Bronchitis, not specified as acute or chronic: Secondary | ICD-10-CM | POA: Diagnosis not present

## 2020-05-12 DIAGNOSIS — I509 Heart failure, unspecified: Secondary | ICD-10-CM | POA: Diagnosis not present

## 2020-05-12 DIAGNOSIS — U071 COVID-19: Secondary | ICD-10-CM | POA: Diagnosis not present

## 2020-05-12 DIAGNOSIS — Z299 Encounter for prophylactic measures, unspecified: Secondary | ICD-10-CM | POA: Diagnosis not present

## 2020-05-12 DIAGNOSIS — D582 Other hemoglobinopathies: Secondary | ICD-10-CM | POA: Diagnosis not present

## 2020-05-23 DIAGNOSIS — Z299 Encounter for prophylactic measures, unspecified: Secondary | ICD-10-CM | POA: Diagnosis not present

## 2020-05-23 DIAGNOSIS — I509 Heart failure, unspecified: Secondary | ICD-10-CM | POA: Diagnosis not present

## 2020-05-23 DIAGNOSIS — I10 Essential (primary) hypertension: Secondary | ICD-10-CM | POA: Diagnosis not present

## 2020-05-23 DIAGNOSIS — D582 Other hemoglobinopathies: Secondary | ICD-10-CM | POA: Diagnosis not present

## 2020-06-06 ENCOUNTER — Other Ambulatory Visit: Payer: Self-pay

## 2020-06-06 ENCOUNTER — Other Ambulatory Visit (HOSPITAL_COMMUNITY)
Admission: RE | Admit: 2020-06-06 | Discharge: 2020-06-06 | Disposition: A | Discharge status: Home / Self Care | Payer: Medicare Other | Source: Ambulatory Visit | Attending: Nephrology | Admitting: Nephrology

## 2020-06-06 DIAGNOSIS — N189 Chronic kidney disease, unspecified: Secondary | ICD-10-CM | POA: Insufficient documentation

## 2020-06-06 DIAGNOSIS — D631 Anemia in chronic kidney disease: Secondary | ICD-10-CM | POA: Insufficient documentation

## 2020-06-06 DIAGNOSIS — E211 Secondary hyperparathyroidism, not elsewhere classified: Secondary | ICD-10-CM | POA: Diagnosis not present

## 2020-06-06 LAB — CBC
HCT: 34.1 % — ABNORMAL LOW (ref 36.0–46.0)
Hemoglobin: 10.5 g/dL — ABNORMAL LOW (ref 12.0–15.0)
MCH: 32 pg (ref 26.0–34.0)
MCHC: 30.8 g/dL (ref 30.0–36.0)
MCV: 104 fL — ABNORMAL HIGH (ref 80.0–100.0)
Platelets: 219 10*3/uL (ref 150–400)
RBC: 3.28 MIL/uL — ABNORMAL LOW (ref 3.87–5.11)
RDW: 13.8 % (ref 11.5–15.5)
WBC: 6.7 10*3/uL (ref 4.0–10.5)
nRBC: 0 % (ref 0.0–0.2)

## 2020-06-06 LAB — RENAL FUNCTION PANEL
Albumin: 3.3 g/dL — ABNORMAL LOW (ref 3.5–5.0)
Anion gap: 9 (ref 5–15)
BUN: 33 mg/dL — ABNORMAL HIGH (ref 8–23)
CO2: 24 mmol/L (ref 22–32)
Calcium: 9.6 mg/dL (ref 8.9–10.3)
Chloride: 109 mmol/L (ref 98–111)
Creatinine, Ser: 3.41 mg/dL — ABNORMAL HIGH (ref 0.44–1.00)
GFR, Estimated: 14 mL/min — ABNORMAL LOW (ref >60)
Glucose, Bld: 88 mg/dL (ref 70–99)
Phosphorus: 3.7 mg/dL (ref 2.5–4.6)
Potassium: 4.4 mmol/L (ref 3.5–5.1)
Sodium: 142 mmol/L (ref 135–145)

## 2020-06-10 DIAGNOSIS — R809 Proteinuria, unspecified: Secondary | ICD-10-CM | POA: Diagnosis not present

## 2020-06-10 DIAGNOSIS — E211 Secondary hyperparathyroidism, not elsewhere classified: Secondary | ICD-10-CM | POA: Diagnosis not present

## 2020-06-10 DIAGNOSIS — D631 Anemia in chronic kidney disease: Secondary | ICD-10-CM | POA: Diagnosis not present

## 2020-06-10 DIAGNOSIS — E872 Acidosis: Secondary | ICD-10-CM | POA: Diagnosis not present

## 2020-06-10 DIAGNOSIS — N185 Chronic kidney disease, stage 5: Secondary | ICD-10-CM | POA: Diagnosis not present

## 2020-06-10 DIAGNOSIS — N189 Chronic kidney disease, unspecified: Secondary | ICD-10-CM | POA: Diagnosis not present

## 2020-06-10 DIAGNOSIS — R718 Other abnormality of red blood cells: Secondary | ICD-10-CM | POA: Diagnosis not present

## 2020-06-30 NOTE — Progress Notes (Signed)
[facility] [place], [place] [address]   CLINIC:  Medical Oncology/Hematology  PCP:  [name], MD [address] / [place] [address]  [phone]  REASON FOR VISIT:  Follow-up for anemia due to CKD  PRIOR THERAPY: Aranesp  CURRENT THERAPY: Retacrit 40,000 units every 12 weeks  INTERVAL HISTORY:  Ms. Shannon [name], a 65 y.o. female, returns for routine follow-up for her anemia due to CKD. Sahana was last seen by Francene Finders, NP, on 01/15/2020.  Today she reports feeling very well with energy level 75%.  Appetite is 100%.  Denies any bleeding per rectum or melena.  She is taking over-the-counter multivitamin and denies taking any iron pills.  She is following up with Dr. Theador Hawthorne every 6 weeks.  REVIEW OF SYSTEMS:  Review of Systems  All other systems reviewed and are negative.   PAST MEDICAL/SURGICAL HISTORY:  Past Medical History:  Diagnosis Date  . Absolute anemia 11/10/2015  . Anemia   . Anemia of renal disease 11/10/2015  . Asthma    has Albuterol inhaler prn  . CHF (congestive heart failure) ([place])    acute systolic CHF 10/[facility])  . Chronic kidney disease    Membranous nephropathy ( Seen at [facility])  . Endometrial adenocarcinoma ([place])   . Gastric ulcer   . History of blood transfusion    no abnormal reaction noted  . History of bronchitis    last time 3-48yrs ago  . Hyperlipidemia    takes Pravastatin daily  . Hypertension    takes Imdur and Hydralazine daily as well as Coreg  . Hypothyroidism    takes Synthroid daily  . Itching    on legs-has a Kenalog cream  . Metabolic bone disease   . Nausea    takes Zofran prn  . Nonischemic cardiomyopathy ([place])    12/2011 normal coronaries, EF 22% (EF normal 04/2013)  . Obesity   . Peripheral edema    takes Torsemide daily  . Thyroid disease    hypothyroidism   Past Surgical History:  Procedure Laterality Date  . AV FISTULA PLACEMENT Left 07/22/2012   Procedure:  INSERTION OF ARTERIOVENOUS GORE-TEX GRAFT ARM;  Surgeon: Angelia Mould, MD;  Location: [place];  Service: Vascular;  Laterality: Left;  . AV FISTULA PLACEMENT Right 05/24/2017   Procedure: INSERTION OF ARTERIOVENOUS (AV) GORE-TEX GRAFT RIGHT ARM;  Surgeon: Angelia Mould, MD;  Location: [place];  Service: Vascular;  Laterality: Right;  . CESAREAN SECTION     30+yrs ago  . COLONOSCOPY    . DILATION AND CURETTAGE, DIAGNOSTIC / THERAPEUTIC    . left arm surgery with pin     15+yrs ago  . ROBOTIC ASSISTED TOTAL HYSTERECTOMY WITH BILATERAL SALPINGO OOPHERECTOMY Bilateral 09/24/2019   Procedure: XI ROBOTIC ASSISTED TOTAL HYSTERECTOMY WITH BILATERAL SALPINGO OOPHORECTOMY;  Surgeon: [name], MD;  [facility];  Service: Gynecology;  Laterality: Bilateral;  . SENTINEL NODE BIOPSY N/A 09/24/2019   Procedure: SENTINEL NODE BIOPSY;  Surgeon: [name], MD;  [facility];  Service: Gynecology;  Laterality: N/A;  . TUBAL LIGATION      SOCIAL HISTORY:  Social History   Socioeconomic History  . Marital status: Single    Spouse name: Not on file  . Number of children: Not on file  . Years of education: Not on file  . Highest education level: Not on file  Occupational History  . Not on file  Tobacco Use  . Smoking status: Current Every Day Smoker    Packs/day: 1.00    Years: 40.00    Pack years: 40.00    Types: Cigarettes    Start date: 05/14/1968  . Smokeless tobacco: Never Used  Vaping Use  . Vaping Use: Never used  Substance and Sexual Activity  . Alcohol use: No  . Drug use: No  . Sexual activity: Not Currently  Other Topics Concern  . Not on file  Social History Narrative  . Not on file   Social Determinants of Health   Financial Resource Strain: Not on file  Food Insecurity: Not on file  Transportation Needs: Not on file  Physical Activity: Not on file  Stress: Not on file  Social Connections: Not on file  Intimate Partner Violence: Not  on file    FAMILY HISTORY:  Family History  Problem Relation Age of Onset  . Kidney disease Other   . Diabetes Mother   . Breast cancer Neg Hx   . Colon cancer Neg Hx   . Uterine cancer Neg Hx   . Ovarian cancer Neg Hx     CURRENT MEDICATIONS:  Current Outpatient Medications  Medication Sig Dispense Refill  . acetaminophen (TYLENOL) 500 MG tablet Take 1,000 mg by mouth daily as needed for moderate pain or headache.    . albuterol (PROVENTIL HFA;VENTOLIN HFA) 108 (90 BASE) MCG/ACT inhaler Inhale 1-2 puffs into the lungs every 6 (six) hours as needed for wheezing.     Marland Kitchen augmented betamethasone dipropionate (DIPROLENE-AF) 0.05 % cream Apply 1 application topically 2 (two) times daily as needed (skin irritation.).     Marland Kitchen carvedilol (COREG) 12.5 MG tablet Take 12.5 mg by mouth 2 (two) times daily.    . clobetasol cream (TEMOVATE) 4.09 % Apply 1 application topically daily as needed (rash).     . hydrALAZINE (APRESOLINE) 50 MG tablet Take 50 mg by mouth 3 (three) times daily.     . isosorbide mononitrate (IMDUR) 30 MG 24 hr tablet Take 30 mg by mouth daily.    . metolazone (ZAROXOLYN) 5 MG tablet Take 5 mg by mouth 2 (two) times daily as needed (swelling).     . pravastatin (PRAVACHOL) 80 MG tablet Take 80 mg by mouth daily.    . raloxifene (EVISTA) 60 MG tablet Take 60 mg by mouth daily.    Marland Kitchen RAYALDEE 30 MCG CPCR Take 30 mcg by mouth daily.     . sodium bicarbonate 650 MG tablet Take 1,300 mg by mouth 3 (three) times daily.     Marland Kitchen torsemide (DEMADEX) 20 MG tablet Take 20 mg by mouth daily as needed (swelling).      No current facility-administered medications for this visit.    ALLERGIES:  Allergies  Allergen Reactions  . Corticosteroids Hives, Itching, Nausea And Vomiting and Rash    Tolerated dexamethasone when being treated for a reaction to rituximab  . Other Hives, Itching, Nausea And Vomiting and Rash    Tolerated dexamethasone when being treated for a reaction to  rituximab Tolerated dexamethasone when being treated for a reaction to rituximab  Tolerated dexamethasone when being treated for a reaction to rituximab  . Rituximab Shortness Of Breath    wheezing wheezing     PHYSICAL EXAM:  Performance status (ECOG): 1 - Symptomatic but completely ambulatory  There were no vitals filed for this visit. Wt Readings from Last 3 Encounters:  04/22/20 223 lb 12.8 oz (101.5 kg)  01/15/20 229  lb 11.5 oz (104.2 kg)  10/19/19 231 lb (104.8 kg)   Physical Exam Vitals reviewed.  Constitutional:      Appearance: Normal appearance.  Cardiovascular:     Rate and Rhythm: Normal rate and regular rhythm.     Heart sounds: Normal heart sounds.  Pulmonary:     Effort: Pulmonary effort is normal.     Breath sounds: Normal breath sounds.  Skin:    General: Skin is warm.  Neurological:     General: No focal deficit present.     Mental Status: She is alert and oriented to person, place, and time.  Psychiatric:        Mood and Affect: Mood normal.        Behavior: Behavior normal.     LABORATORY DATA:  I have reviewed the labs as listed.  CBC Latest Ref Rng & Units 06/06/2020 04/11/2020 03/28/2020  WBC 4.0 - 10.5 K/uL 6.7 7.8 6.6  Hemoglobin 12.0 - 15.0 g/dL 10.5(L) 10.8(L) 10.8(L)  Hematocrit 36.0 - 46.0 % 34.1(L) 35.0(L) 34.6(L)  Platelets 150 - 400 K/uL 219 216 226   CMP Latest Ref Rng & Units 06/06/2020 04/11/2020 03/28/2020  Glucose 70 - 99 mg/dL 88 85 97  BUN 8 - 23 mg/dL 33(H) 36(H) 40(H)  Creatinine 0.44 - 1.00 mg/dL 3.41(H) 3.87(H) 4.04(H)  Sodium 135 - 145 mmol/L 142 139 140  Potassium 3.5 - 5.1 mmol/L 4.4 3.9 4.1  Chloride 98 - 111 mmol/L 109 108 111  CO2 22 - 32 mmol/L 24 21(L) 19(L)  Calcium 8.9 - 10.3 mg/dL 9.6 8.9 8.8(L)  Total Protein 6.5 - 8.1 g/dL - 7.2 -  Total Bilirubin 0.3 - 1.2 mg/dL - 0.4 -  Alkaline Phos 38 - 126 U/L - 73 -  AST 15 - 41 U/L - 17 -  ALT 0 - 44 U/L - 14 -      Component Value Date/Time   RBC 3.28 (L)  06/06/2020 1224   MCV 104.0 (H) 06/06/2020 1224   MCH 32.0 06/06/2020 1224   MCHC 30.8 06/06/2020 1224   RDW 13.8 06/06/2020 1224   LYMPHSABS 1.7 04/11/2020 1346   MONOABS 0.4 04/11/2020 1346   EOSABS 0.3 04/11/2020 1346   BASOSABS 0.0 04/11/2020 1346    DIAGNOSTIC IMAGING:  I have independently reviewed the scans and discussed with the patient. No results found.   ASSESSMENT:  1.  Anemia due to chronic kidney disease: - This is due to CKD. - Patient is on Aranesp which was started 11/10/2015.  Currently on Aranesp 150 mcg every 28 days.  Last dose was on flow 09/15/2018 - Last colonoscopy was reportedly done in [place], around 7 years ago. - It is recorded that she has a history of AVMs and esophageal varices. -She does have a right forearm fistula has not started dialysis yet. - She will continue Aranesp every 12 week for hemoglobin less than 10.0 g/dL as per nephrology recommendations. -Patient had a negative SPEP.   2.  Vitamin B12 deficiency: -She was placed on sublingual vitamin B12.   PLAN:  1.  Anemia due to chronic kidney disease: -Her last Retacrit was in June 2021. -Reviewed labs from today which showed ferritin 291 and percent saturation 29.  Hemoglobin is 10.7. -She does not require any parenteral iron or ESA. -RTC 4 months with repeat labs.  2.  Vitamin B12 deficiency: -Continue vitamin B12 daily.  3.  CKD: -Creatinine is 3.39.  Continue to follow-up with Dr. Theador Hawthorne every 6  weeks.   Orders placed this encounter:  No orders of the defined types were placed in this encounter.    Derek Jack, MD [place] [phone]   I, Milinda Antis, am acting as a scribe for Dr. Sanda Linger.  I, Derek Jack MD, have reviewed the above documentation for accuracy and completeness, and I agree with the above.

## 2020-07-01 ENCOUNTER — Other Ambulatory Visit (HOSPITAL_COMMUNITY): Payer: Self-pay

## 2020-07-01 DIAGNOSIS — D508 Other iron deficiency anemias: Secondary | ICD-10-CM

## 2020-07-01 DIAGNOSIS — N189 Chronic kidney disease, unspecified: Secondary | ICD-10-CM

## 2020-07-01 DIAGNOSIS — D631 Anemia in chronic kidney disease: Secondary | ICD-10-CM

## 2020-07-04 ENCOUNTER — Other Ambulatory Visit: Payer: Self-pay

## 2020-07-04 ENCOUNTER — Inpatient Hospital Stay (HOSPITAL_COMMUNITY): Payer: Medicare Other | Attending: Hematology

## 2020-07-04 ENCOUNTER — Inpatient Hospital Stay (HOSPITAL_BASED_OUTPATIENT_CLINIC_OR_DEPARTMENT_OTHER): Payer: Medicare Other | Admitting: Hematology

## 2020-07-04 ENCOUNTER — Inpatient Hospital Stay (HOSPITAL_COMMUNITY): Payer: Medicare Other

## 2020-07-04 VITALS — BP 121/54 | HR 69 | Temp 97.1°F | Resp 20 | Wt 230.5 lb

## 2020-07-04 DIAGNOSIS — N189 Chronic kidney disease, unspecified: Secondary | ICD-10-CM

## 2020-07-04 DIAGNOSIS — D508 Other iron deficiency anemias: Secondary | ICD-10-CM

## 2020-07-04 DIAGNOSIS — E538 Deficiency of other specified B group vitamins: Secondary | ICD-10-CM | POA: Insufficient documentation

## 2020-07-04 DIAGNOSIS — D631 Anemia in chronic kidney disease: Secondary | ICD-10-CM | POA: Diagnosis not present

## 2020-07-04 DIAGNOSIS — E039 Hypothyroidism, unspecified: Secondary | ICD-10-CM | POA: Diagnosis not present

## 2020-07-04 LAB — CBC WITH DIFFERENTIAL/PLATELET
Abs Immature Granulocytes: 0.03 10*3/uL (ref 0.00–0.07)
Basophils Absolute: 0 10*3/uL (ref 0.0–0.1)
Basophils Relative: 0 %
Eosinophils Absolute: 0.3 10*3/uL (ref 0.0–0.5)
Eosinophils Relative: 5 %
HCT: 35.1 % — ABNORMAL LOW (ref 36.0–46.0)
Hemoglobin: 10.7 g/dL — ABNORMAL LOW (ref 12.0–15.0)
Immature Granulocytes: 0 %
Lymphocytes Relative: 21 %
Lymphs Abs: 1.4 10*3/uL (ref 0.7–4.0)
MCH: 32 pg (ref 26.0–34.0)
MCHC: 30.5 g/dL (ref 30.0–36.0)
MCV: 105.1 fL — ABNORMAL HIGH (ref 80.0–100.0)
Monocytes Absolute: 0.3 10*3/uL (ref 0.1–1.0)
Monocytes Relative: 5 %
Neutro Abs: 4.6 10*3/uL (ref 1.7–7.7)
Neutrophils Relative %: 69 %
Platelets: 217 10*3/uL (ref 150–400)
RBC: 3.34 MIL/uL — ABNORMAL LOW (ref 3.87–5.11)
RDW: 13.6 % (ref 11.5–15.5)
WBC: 6.7 10*3/uL (ref 4.0–10.5)
nRBC: 0 % (ref 0.0–0.2)

## 2020-07-04 LAB — COMPREHENSIVE METABOLIC PANEL
ALT: 14 U/L (ref 0–44)
AST: 16 U/L (ref 15–41)
Albumin: 3.4 g/dL — ABNORMAL LOW (ref 3.5–5.0)
Alkaline Phosphatase: 76 U/L (ref 38–126)
Anion gap: 8 (ref 5–15)
BUN: 39 mg/dL — ABNORMAL HIGH (ref 8–23)
CO2: 25 mmol/L (ref 22–32)
Calcium: 9.1 mg/dL (ref 8.9–10.3)
Chloride: 110 mmol/L (ref 98–111)
Creatinine, Ser: 3.39 mg/dL — ABNORMAL HIGH (ref 0.44–1.00)
GFR, Estimated: 14 mL/min — ABNORMAL LOW (ref >60)
Glucose, Bld: 101 mg/dL — ABNORMAL HIGH (ref 70–99)
Potassium: 4.1 mmol/L (ref 3.5–5.1)
Sodium: 143 mmol/L (ref 135–145)
Total Bilirubin: 0.5 mg/dL (ref 0.3–1.2)
Total Protein: 6.9 g/dL (ref 6.5–8.1)

## 2020-07-04 LAB — IRON AND TIBC
Iron: 73 ug/dL (ref 28–170)
Saturation Ratios: 29 % (ref 10.4–31.8)
TIBC: 249 ug/dL — ABNORMAL LOW (ref 250–450)
UIBC: 176 ug/dL

## 2020-07-04 LAB — FERRITIN: Ferritin: 291 ng/mL (ref 11–307)

## 2020-07-04 NOTE — Progress Notes (Signed)
Hgb 10.7 today.

## 2020-07-04 NOTE — Patient Instructions (Signed)
[facility] at [facility] Discharge Instructions  You were seen and examined today by Dr. Katragadda. Please follow up as scheduled.    Thank you for choosing [facility] at [facility] to provide your oncology and hematology care.  To afford each patient quality time with our provider, please arrive at least 15 minutes before your scheduled appointment time.   If you have a lab appointment with the Cancer Center please come in thru the Main Entrance and check in at the main information desk.  You need to re-schedule your appointment should you arrive 10 or more minutes late.  We strive to give you quality time with our providers, and arriving late affects you and other patients whose appointments are after yours.  Also, if you no show three or more times for appointments you may be dismissed from the clinic at the providers discretion.     Again, thank you for choosing [facility].  Our hope is that these requests will decrease the amount of time that you wait before being seen by our physicians.       _____________________________________________________________  Should you have questions after your visit to [facility], please contact our office at [phone] and follow the prompts.  Our office hours are 8:00 a.m. and 4:30 p.m. Monday - Friday.  Please note that voicemails left after 4:00 p.m. may not be returned until the following business day.  We are closed weekends and major holidays.  You do have access to a nurse 24-7, just call the main number to the clinic [phone] and do not press any options, hold on the line and a nurse will answer the phone.    For prescription refill requests, have your pharmacy contact our office and allow 72 hours.    Due to Covid, you will need to wear a mask upon entering the hospital. If you do not have a mask, a mask will be given to you at the Main Entrance upon arrival. For  doctor visits, patients may have 1 support person age 18 or older with them. For treatment visits, patients can not have anyone with them due to social distancing guidelines and our immunocompromised population.      

## 2020-07-11 DIAGNOSIS — I509 Heart failure, unspecified: Secondary | ICD-10-CM | POA: Diagnosis not present

## 2020-07-11 DIAGNOSIS — I129 Hypertensive chronic kidney disease with stage 1 through stage 4 chronic kidney disease, or unspecified chronic kidney disease: Secondary | ICD-10-CM | POA: Diagnosis not present

## 2020-07-11 DIAGNOSIS — M810 Age-related osteoporosis without current pathological fracture: Secondary | ICD-10-CM | POA: Diagnosis not present

## 2020-07-11 DIAGNOSIS — E7800 Pure hypercholesterolemia, unspecified: Secondary | ICD-10-CM | POA: Diagnosis not present

## 2020-07-18 ENCOUNTER — Other Ambulatory Visit: Payer: Self-pay

## 2020-07-18 ENCOUNTER — Other Ambulatory Visit (HOSPITAL_COMMUNITY)
Admission: RE | Admit: 2020-07-18 | Discharge: 2020-07-18 | Disposition: A | Discharge status: Home / Self Care | Payer: Medicare Other | Source: Ambulatory Visit | Attending: Nephrology | Admitting: Nephrology

## 2020-07-18 DIAGNOSIS — N189 Chronic kidney disease, unspecified: Secondary | ICD-10-CM | POA: Diagnosis not present

## 2020-07-18 DIAGNOSIS — E211 Secondary hyperparathyroidism, not elsewhere classified: Secondary | ICD-10-CM | POA: Insufficient documentation

## 2020-07-18 DIAGNOSIS — N185 Chronic kidney disease, stage 5: Secondary | ICD-10-CM | POA: Insufficient documentation

## 2020-07-18 DIAGNOSIS — R809 Proteinuria, unspecified: Secondary | ICD-10-CM | POA: Insufficient documentation

## 2020-07-18 DIAGNOSIS — E872 Acidosis: Secondary | ICD-10-CM | POA: Diagnosis not present

## 2020-07-18 DIAGNOSIS — D631 Anemia in chronic kidney disease: Secondary | ICD-10-CM | POA: Diagnosis not present

## 2020-07-18 DIAGNOSIS — Z299 Encounter for prophylactic measures, unspecified: Secondary | ICD-10-CM | POA: Diagnosis not present

## 2020-07-18 DIAGNOSIS — I10 Essential (primary) hypertension: Secondary | ICD-10-CM | POA: Diagnosis not present

## 2020-07-18 DIAGNOSIS — I509 Heart failure, unspecified: Secondary | ICD-10-CM | POA: Diagnosis not present

## 2020-07-18 LAB — RENAL FUNCTION PANEL
Albumin: 3.6 g/dL (ref 3.5–5.0)
Anion gap: 9 (ref 5–15)
BUN: 31 mg/dL — ABNORMAL HIGH (ref 8–23)
CO2: 23 mmol/L (ref 22–32)
Calcium: 9.1 mg/dL (ref 8.9–10.3)
Chloride: 109 mmol/L (ref 98–111)
Creatinine, Ser: 3.73 mg/dL — ABNORMAL HIGH (ref 0.44–1.00)
GFR, Estimated: 13 mL/min — ABNORMAL LOW (ref >60)
Glucose, Bld: 94 mg/dL (ref 70–99)
Phosphorus: 3.7 mg/dL (ref 2.5–4.6)
Potassium: 4.3 mmol/L (ref 3.5–5.1)
Sodium: 141 mmol/L (ref 135–145)

## 2020-07-18 LAB — PROTEIN / CREATININE RATIO, URINE
Creatinine, Urine: 61.45 mg/dL
Protein Creatinine Ratio: 0.81 mg/mg{Cre} — ABNORMAL HIGH (ref 0.00–0.15)
Total Protein, Urine: 50 mg/dL

## 2020-07-18 LAB — CBC
HCT: 37.9 % (ref 36.0–46.0)
Hemoglobin: 11.6 g/dL — ABNORMAL LOW (ref 12.0–15.0)
MCH: 32.2 pg (ref 26.0–34.0)
MCHC: 30.6 g/dL (ref 30.0–36.0)
MCV: 105.3 fL — ABNORMAL HIGH (ref 80.0–100.0)
Platelets: 257 10*3/uL (ref 150–400)
RBC: 3.6 MIL/uL — ABNORMAL LOW (ref 3.87–5.11)
RDW: 13.6 % (ref 11.5–15.5)
WBC: 7.7 10*3/uL (ref 4.0–10.5)
nRBC: 0 % (ref 0.0–0.2)

## 2020-07-18 LAB — FOLATE: Folate: 11.1 ng/mL (ref >5.9)

## 2020-07-18 LAB — VITAMIN B12: Vitamin B-12: 939 pg/mL — ABNORMAL HIGH (ref 180–914)

## 2020-07-22 DIAGNOSIS — E211 Secondary hyperparathyroidism, not elsewhere classified: Secondary | ICD-10-CM | POA: Diagnosis not present

## 2020-07-22 DIAGNOSIS — D631 Anemia in chronic kidney disease: Secondary | ICD-10-CM | POA: Diagnosis not present

## 2020-07-22 DIAGNOSIS — N189 Chronic kidney disease, unspecified: Secondary | ICD-10-CM | POA: Diagnosis not present

## 2020-07-22 DIAGNOSIS — R809 Proteinuria, unspecified: Secondary | ICD-10-CM | POA: Diagnosis not present

## 2020-07-22 DIAGNOSIS — N032 Chronic nephritic syndrome with diffuse membranous glomerulonephritis: Secondary | ICD-10-CM | POA: Diagnosis not present

## 2020-07-22 DIAGNOSIS — R718 Other abnormality of red blood cells: Secondary | ICD-10-CM | POA: Diagnosis not present

## 2020-07-22 DIAGNOSIS — N185 Chronic kidney disease, stage 5: Secondary | ICD-10-CM | POA: Diagnosis not present

## 2020-08-10 DIAGNOSIS — E7800 Pure hypercholesterolemia, unspecified: Secondary | ICD-10-CM | POA: Diagnosis not present

## 2020-08-10 DIAGNOSIS — M810 Age-related osteoporosis without current pathological fracture: Secondary | ICD-10-CM | POA: Diagnosis not present

## 2020-08-10 DIAGNOSIS — I129 Hypertensive chronic kidney disease with stage 1 through stage 4 chronic kidney disease, or unspecified chronic kidney disease: Secondary | ICD-10-CM | POA: Diagnosis not present

## 2020-08-30 ENCOUNTER — Other Ambulatory Visit: Payer: Self-pay

## 2020-08-30 ENCOUNTER — Other Ambulatory Visit (HOSPITAL_COMMUNITY)
Admission: RE | Admit: 2020-08-30 | Discharge: 2020-08-30 | Disposition: A | Discharge status: Home / Self Care | Payer: Medicare Other | Source: Ambulatory Visit | Attending: Nephrology | Admitting: Nephrology

## 2020-08-30 DIAGNOSIS — N185 Chronic kidney disease, stage 5: Secondary | ICD-10-CM | POA: Insufficient documentation

## 2020-08-30 DIAGNOSIS — D631 Anemia in chronic kidney disease: Secondary | ICD-10-CM | POA: Diagnosis not present

## 2020-08-30 DIAGNOSIS — N032 Chronic nephritic syndrome with diffuse membranous glomerulonephritis: Secondary | ICD-10-CM | POA: Insufficient documentation

## 2020-08-30 DIAGNOSIS — E211 Secondary hyperparathyroidism, not elsewhere classified: Secondary | ICD-10-CM | POA: Diagnosis not present

## 2020-08-30 DIAGNOSIS — N189 Chronic kidney disease, unspecified: Secondary | ICD-10-CM | POA: Insufficient documentation

## 2020-08-30 DIAGNOSIS — R809 Proteinuria, unspecified: Secondary | ICD-10-CM | POA: Insufficient documentation

## 2020-08-30 DIAGNOSIS — R718 Other abnormality of red blood cells: Secondary | ICD-10-CM | POA: Diagnosis not present

## 2020-08-30 LAB — CBC
HCT: 36.7 % (ref 36.0–46.0)
Hemoglobin: 11.6 g/dL — ABNORMAL LOW (ref 12.0–15.0)
MCH: 32.7 pg (ref 26.0–34.0)
MCHC: 31.6 g/dL (ref 30.0–36.0)
MCV: 103.4 fL — ABNORMAL HIGH (ref 80.0–100.0)
Platelets: 224 10*3/uL (ref 150–400)
RBC: 3.55 MIL/uL — ABNORMAL LOW (ref 3.87–5.11)
RDW: 13.2 % (ref 11.5–15.5)
WBC: 8.8 10*3/uL (ref 4.0–10.5)
nRBC: 0 % (ref 0.0–0.2)

## 2020-08-30 LAB — RENAL FUNCTION PANEL
Albumin: 3.5 g/dL (ref 3.5–5.0)
Anion gap: 8 (ref 5–15)
BUN: 32 mg/dL — ABNORMAL HIGH (ref 8–23)
CO2: 22 mmol/L (ref 22–32)
Calcium: 9 mg/dL (ref 8.9–10.3)
Chloride: 107 mmol/L (ref 98–111)
Creatinine, Ser: 3.44 mg/dL — ABNORMAL HIGH (ref 0.44–1.00)
GFR, Estimated: 14 mL/min — ABNORMAL LOW (ref >60)
Glucose, Bld: 93 mg/dL (ref 70–99)
Phosphorus: 3.1 mg/dL (ref 2.5–4.6)
Potassium: 4.1 mmol/L (ref 3.5–5.1)
Sodium: 137 mmol/L (ref 135–145)

## 2020-08-30 LAB — PROTEIN / CREATININE RATIO, URINE
Creatinine, Urine: 105.98 mg/dL
Protein Creatinine Ratio: 0.69 mg/mg{Cre} — ABNORMAL HIGH (ref 0.00–0.15)
Total Protein, Urine: 73 mg/dL

## 2020-09-02 DIAGNOSIS — D631 Anemia in chronic kidney disease: Secondary | ICD-10-CM | POA: Diagnosis not present

## 2020-09-02 DIAGNOSIS — R809 Proteinuria, unspecified: Secondary | ICD-10-CM | POA: Diagnosis not present

## 2020-09-02 DIAGNOSIS — N189 Chronic kidney disease, unspecified: Secondary | ICD-10-CM | POA: Diagnosis not present

## 2020-09-02 DIAGNOSIS — R718 Other abnormality of red blood cells: Secondary | ICD-10-CM | POA: Diagnosis not present

## 2020-09-02 DIAGNOSIS — E211 Secondary hyperparathyroidism, not elsewhere classified: Secondary | ICD-10-CM | POA: Diagnosis not present

## 2020-09-02 DIAGNOSIS — N185 Chronic kidney disease, stage 5: Secondary | ICD-10-CM | POA: Diagnosis not present

## 2020-09-02 DIAGNOSIS — N032 Chronic nephritic syndrome with diffuse membranous glomerulonephritis: Secondary | ICD-10-CM | POA: Diagnosis not present

## 2020-10-10 DIAGNOSIS — K219 Gastro-esophageal reflux disease without esophagitis: Secondary | ICD-10-CM | POA: Diagnosis not present

## 2020-10-10 DIAGNOSIS — E039 Hypothyroidism, unspecified: Secondary | ICD-10-CM | POA: Diagnosis not present

## 2020-10-11 ENCOUNTER — Other Ambulatory Visit (HOSPITAL_COMMUNITY)
Admission: RE | Admit: 2020-10-11 | Discharge: 2020-10-11 | Disposition: A | Discharge status: Home / Self Care | Payer: Medicare Other | Source: Ambulatory Visit | Attending: Nephrology | Admitting: Nephrology

## 2020-10-11 ENCOUNTER — Other Ambulatory Visit: Payer: Self-pay

## 2020-10-11 DIAGNOSIS — N185 Chronic kidney disease, stage 5: Secondary | ICD-10-CM | POA: Insufficient documentation

## 2020-10-11 DIAGNOSIS — E211 Secondary hyperparathyroidism, not elsewhere classified: Secondary | ICD-10-CM | POA: Diagnosis not present

## 2020-10-11 DIAGNOSIS — R718 Other abnormality of red blood cells: Secondary | ICD-10-CM | POA: Insufficient documentation

## 2020-10-11 DIAGNOSIS — D631 Anemia in chronic kidney disease: Secondary | ICD-10-CM | POA: Diagnosis not present

## 2020-10-11 DIAGNOSIS — N032 Chronic nephritic syndrome with diffuse membranous glomerulonephritis: Secondary | ICD-10-CM | POA: Insufficient documentation

## 2020-10-11 DIAGNOSIS — R809 Proteinuria, unspecified: Secondary | ICD-10-CM | POA: Diagnosis not present

## 2020-10-11 LAB — RENAL FUNCTION PANEL
Albumin: 3.6 g/dL (ref 3.5–5.0)
Anion gap: 8 (ref 5–15)
BUN: 34 mg/dL — ABNORMAL HIGH (ref 8–23)
CO2: 23 mmol/L (ref 22–32)
Calcium: 9.2 mg/dL (ref 8.9–10.3)
Chloride: 109 mmol/L (ref 98–111)
Creatinine, Ser: 3.41 mg/dL — ABNORMAL HIGH (ref 0.44–1.00)
GFR, Estimated: 14 mL/min — ABNORMAL LOW (ref >60)
Glucose, Bld: 105 mg/dL — ABNORMAL HIGH (ref 70–99)
Phosphorus: 4.2 mg/dL (ref 2.5–4.6)
Potassium: 4 mmol/L (ref 3.5–5.1)
Sodium: 140 mmol/L (ref 135–145)

## 2020-10-11 LAB — PROTEIN / CREATININE RATIO, URINE
Creatinine, Urine: 115.75 mg/dL
Protein Creatinine Ratio: 0.62 mg/mg{Cre} — ABNORMAL HIGH (ref 0.00–0.15)
Total Protein, Urine: 72 mg/dL

## 2020-10-11 LAB — FERRITIN: Ferritin: 298 ng/mL (ref 11–307)

## 2020-10-11 LAB — CBC
HCT: 37.4 % (ref 36.0–46.0)
Hemoglobin: 11.4 g/dL — ABNORMAL LOW (ref 12.0–15.0)
MCH: 31.6 pg (ref 26.0–34.0)
MCHC: 30.5 g/dL (ref 30.0–36.0)
MCV: 103.6 fL — ABNORMAL HIGH (ref 80.0–100.0)
Platelets: 235 10*3/uL (ref 150–400)
RBC: 3.61 MIL/uL — ABNORMAL LOW (ref 3.87–5.11)
RDW: 13.2 % (ref 11.5–15.5)
WBC: 8.3 10*3/uL (ref 4.0–10.5)
nRBC: 0 % (ref 0.0–0.2)

## 2020-10-11 LAB — IRON AND TIBC
Iron: 54 ug/dL (ref 28–170)
Saturation Ratios: 22 % (ref 10.4–31.8)
TIBC: 247 ug/dL — ABNORMAL LOW (ref 250–450)
UIBC: 193 ug/dL

## 2020-10-11 LAB — VITAMIN D 25 HYDROXY (VIT D DEFICIENCY, FRACTURES): Vit D, 25-Hydroxy: 69.87 ng/mL (ref 30–100)

## 2020-10-12 LAB — PARATHYROID HORMONE, INTACT (NO CA): PTH: 55 pg/mL (ref 15–65)

## 2020-10-14 DIAGNOSIS — N189 Chronic kidney disease, unspecified: Secondary | ICD-10-CM | POA: Diagnosis not present

## 2020-10-14 DIAGNOSIS — D631 Anemia in chronic kidney disease: Secondary | ICD-10-CM | POA: Diagnosis not present

## 2020-10-14 DIAGNOSIS — N032 Chronic nephritic syndrome with diffuse membranous glomerulonephritis: Secondary | ICD-10-CM | POA: Diagnosis not present

## 2020-10-14 DIAGNOSIS — R809 Proteinuria, unspecified: Secondary | ICD-10-CM | POA: Diagnosis not present

## 2020-10-14 DIAGNOSIS — N185 Chronic kidney disease, stage 5: Secondary | ICD-10-CM | POA: Diagnosis not present

## 2020-10-17 DIAGNOSIS — I10 Essential (primary) hypertension: Secondary | ICD-10-CM | POA: Diagnosis not present

## 2020-10-17 DIAGNOSIS — I25119 Atherosclerotic heart disease of native coronary artery with unspecified angina pectoris: Secondary | ICD-10-CM | POA: Diagnosis not present

## 2020-10-17 DIAGNOSIS — I509 Heart failure, unspecified: Secondary | ICD-10-CM | POA: Diagnosis not present

## 2020-10-17 DIAGNOSIS — Z299 Encounter for prophylactic measures, unspecified: Secondary | ICD-10-CM | POA: Diagnosis not present

## 2020-10-17 DIAGNOSIS — I739 Peripheral vascular disease, unspecified: Secondary | ICD-10-CM | POA: Diagnosis not present

## 2020-10-21 ENCOUNTER — Ambulatory Visit: Payer: Medicare Other | Admitting: Gynecologic Oncology

## 2020-10-24 ENCOUNTER — Encounter: Payer: Self-pay | Admitting: Gynecologic Oncology

## 2020-10-24 DIAGNOSIS — I739 Peripheral vascular disease, unspecified: Secondary | ICD-10-CM | POA: Diagnosis not present

## 2020-10-24 NOTE — Progress Notes (Signed)
Gynecologic Oncology Return Clinic Visit  10/24/20   Reason for Visit: surveillance in the setting of low-risk uterine cancer  Treatment History: Oncology History Overview Note  MSI-stable   Endometrial/uterine adenocarcinoma ([place])  08/13/2019 Initial Biopsy   CKC and D&C - CKC negativ, EMB showed FIGO gr1 EMCA   08/13/2019 Initial Diagnosis   Endometrial/uterine adenocarcinoma ([place])   09/24/2019 Surgery   TRH/BSO, SLN   09/24/2019 Pathology Results   IA, grade 1, endometrioid adenoca; DOI 1/20m, no LVSI, SLNs negative   09/24/2019 Cancer Staging   Staging form: Corpus Uteri - Carcinoma and Carcinosarcoma, AJCC 8th Edition - Clinical stage from 09/24/2019: FIGO Stage IA (cT1a, cN0(sn), cM0) - Signed by [name] MD on 09/29/2019      Interval History: The patient presents today for surveillance visit.  She is doing quite well.  [place] [facility] [place] including [place] daughter and niece.  She denies any vaginal bleeding or discharge.  She reports a good appetite without nausea or emesis.  She denies any recent weight changes.  She reports normal bowel and bladder function.  Past Medical/Surgical History: Past Medical History:  Diagnosis Date   Absolute anemia 11/10/2015   Anemia    Anemia of renal disease 11/10/2015   Asthma    has Albuterol inhaler prn   [place]estive heart [place]) ([place]    acute systolic CHF 82/8413([facility]   Chronic kidney disease    [facility]   [place]ometrial adenocarcinoma ([place]    Gastric ulcer    History of blood transfusion    no abnormal reaction noted   History of bronchitis    last time [place]   Hyperlipidemia    takes Pravastatin daily   Hypertension    takes Imdur and Hydralazine daily as well as Coreg   Hypothyroidism    takes Synthroid daily   Itching    on legs-has a Kenalog cream   Metabolic bone disease    Nausea    takes Zofran prn    [place]ischemic [place]   12/2011 normal coronaries, EF 22% (EF normal 04/2013)   Obesity    Peripheral edema    takes Torsemide daily   Thyroid disease    hypothyroidism    Past Surgical History:  Procedure Laterality Date   AV FISTULA PLACEMENT Left 07/22/2012   Procedure: INSERTION OF ARTERIOVENOUS GORE-TEX GRAFT ARM;  Surgeon: ChAngelia MouldMD;  [place] Service: V[place];  Laterality: Left;   AV FISTULA PLACEMENT Right 05/24/2017   Procedure: INSERTION OF ARTERIOVENOUS (AV) GORE-TEX GRAFT RIGHT ARM;  Surgeon: DiAngelia MouldMD;  [place] Service: [place];  Laterality: Right;   CESAREAN SECTION     30+yrs ago   COLONOSCOPY     DILATION AND CURETTAGE, DIAGNOSTIC / THERAPEUTIC     left arm surgery with pin     15+yrs ago   ROBOTIC ASSISTED TOTAL HYSTERECTOMY WITH BILATERAL SALPINGO OOPHERECTOMY Bilateral 09/24/2019   Procedure: XI [place] Surgeon: TuLafonda MossesMD;  [facility];  Service: Gynecology;  Laterality: Bilateral;   SENTINEL NODE BIOPSY N/A 09/24/2019   Procedure: SENTINEL NODE BIOPSY;  Surgeon: TuLafonda MossesMD;  [facility];  Service: Gynecology;  Laterality: N/A;   TUBAL LIGATION      Family History  Problem Relation Age of Onset   Kidney  disease Other    Diabetes Mother    Breast cancer Neg Hx    Colon cancer Neg Hx    Uterine cancer Neg Hx    Ovarian cancer Neg Hx     Social History   Socioeconomic History   Marital status: Single    Spouse name: Not on file   Number of children: Not on file   Years of education: Not on file   Highest education level: Not on file  Occupational History   Not on file  Tobacco Use   Smoking status: Every Day    Packs/day: 1.00    Years: 40.00    Pack years: 40.00    Types: Cigarettes    Start date: 05/14/1968   Smokeless tobacco: Never  Vaping Use   Vaping Use: Never used  Substance  and Sexual Activity   Alcohol use: No   Drug use: No   Sexual activity: Not Currently  Other Topics Concern   Not on file  Social History Narrative   Not on file   Social Determinants of Health   Financial Resource Strain: Not on file  Food Insecurity: Not on file  Transportation Needs: Not on file  Physical Activity: Not on file  Stress: Not on file  Social Connections: Not on file    Current Medications:  Current Outpatient Medications:    acetaminophen (TYLENOL) 500 MG tablet, Take 1,000 mg by mouth daily as needed for moderate pain or headache., Disp: , Rfl:    albuterol (PROVENTIL HFA;VENTOLIN HFA) 108 (90 BASE) MCG/ACT inhaler, Inhale 1-2 puffs into the lungs every 6 (six) hours as needed for wheezing. , Disp: , Rfl:    augmented betamethasone dipropionate (DIPROLENE-AF) 0.05 % cream, Apply 1 application topically 2 (two) times daily as needed (skin irritation.). , Disp: , Rfl:    carvedilol (COREG) 12.5 MG tablet, Take 12.5 mg by mouth 2 (two) times daily., Disp: , Rfl:    clobetasol cream (TEMOVATE) 8.32 %, Apply 1 application topically daily as needed (rash). , Disp: , Rfl:    hydrALAZINE (APRESOLINE) 50 MG tablet, Take 50 mg by mouth 3 (three) times daily. , Disp: , Rfl:    isosorbide mononitrate (IMDUR) 30 MG 24 hr tablet, Take 30 mg by mouth daily., Disp: , Rfl:    metolazone (ZAROXOLYN) 5 MG tablet, Take 5 mg by mouth 2 (two) times daily as needed (swelling). , Disp: , Rfl:    pravastatin (PRAVACHOL) 80 MG tablet, Take 80 mg by mouth daily., Disp: , Rfl:    raloxifene (EVISTA) 60 MG tablet, Take 60 mg by mouth daily., Disp: , Rfl:    RAYALDEE 30 MCG CPCR, Take 30 mcg by mouth daily. , Disp: , Rfl:    sodium bicarbonate 650 MG tablet, Take 1,300 mg by mouth 3 (three) times daily. , Disp: , Rfl:    torsemide (DEMADEX) 20 MG tablet, Take 20 mg by mouth daily as needed (swelling).  (Patient not taking: Reported on 10/24/2020), Disp: , Rfl:   Review of Systems: Endorses  intermittent wheezing, at baseline. Denies appetite changes, fevers, chills, fatigue, unexplained weight changes. Denies hearing loss, neck lumps or masses, mouth sores, ringing in ears or voice changes. Denies cough.  Denies shortness of breath. Denies chest pain or palpitations. Denies leg swelling. Denies abdominal distention, pain, blood in stools, constipation, diarrhea, nausea, vomiting, or early satiety. Denies pain with intercourse, dysuria, frequency, hematuria or incontinence. Denies hot flashes, pelvic pain, vaginal bleeding or vaginal discharge.  Denies joint pain, back pain or muscle pain/cramps. Denies itching, rash, or wounds. Denies dizziness, headaches, numbness or seizures. Denies swollen lymph nodes or glands, denies easy bruising or bleeding. Denies anxiety, depression, confusion, or decreased concentration.  Physical Exam: BP (!) 141/63 (BP Location: Left Arm, Patient Position: Sitting)   Pulse 66   Temp (!) 97.5 F (36.4 C) (Tympanic)   Resp 18   Ht '5\' 3"'  (1.6 m)   Wt 222 lb 12.8 oz (101.1 kg)   SpO2 99%   BMI 39.47 kg/m  General: Alert, oriented, no acute distress. HEENT: Normocephalic, atraumatic, sclera anicteric. Chest: Unlabored breathing on room air. Abdomen: Obese, soft, nontender.  Normoactive bowel sounds.  No masses or hepatosplenomegaly appreciated.  Well-healed laparoscopic incisions. Extremities: Grossly normal range of motion.  Warm, well perfused.  Trace edema bilaterally. Lymphatics: No cervical, supraclavicular, or inguinal adenopathy. GU: Normal appearing external genitalia without erythema, excoriation, or lesions.  Speculum exam reveals mildly atrophic vaginal mucosa, no lesions or masses, no bleeding or discharge.  Bimanual exam reveals cuff intact, no masses or nodularity.  Rectovaginal exam confirms these findings.  Laboratory & Radiologic Studies: None new  Assessment & Plan: KALIKA SMAY is a 65 y.o. woman with Stage 1A, grade  1 low risk, early stage endometrioid adenocarcinoma who presents for surveillance.  The patient is doing well and is NED on exam today.  The patient and I reviewed NCCN and SGO surveillance guidelines recommendations and the difference between the 2 with visits every 6 months versus every year after the 1 year mark from diagnosis.  The patient prefers to proceed per SGO surveillance recommendations and transitioning to yearly follow-up visits.  We discussed again that a recurrence would be most common at the vaginal cuff. We discussed the signs and symptoms that would be concerning for disease recurrence that should prompt a phone call prior to her next visit.  32 minutes of total time was spent for this patient encounter, including preparation, face-to-face counseling with the patient and coordination of care, and documentation of the encounter.  Jeral Pinch, MD  Division of Gynecologic Oncology  Department of Obstetrics and Gynecology  [facility]

## 2020-10-25 ENCOUNTER — Other Ambulatory Visit: Payer: Self-pay

## 2020-10-25 ENCOUNTER — Encounter: Payer: Self-pay | Admitting: Gynecologic Oncology

## 2020-10-25 ENCOUNTER — Inpatient Hospital Stay: Payer: Medicare Other | Attending: Gynecologic Oncology | Admitting: Gynecologic Oncology

## 2020-10-25 VITALS — BP 141/63 | HR 66 | Temp 97.5°F | Resp 18 | Ht 63.0 in | Wt 222.8 lb

## 2020-10-25 DIAGNOSIS — Z79899 Other long term (current) drug therapy: Secondary | ICD-10-CM | POA: Diagnosis not present

## 2020-10-25 DIAGNOSIS — E785 Hyperlipidemia, unspecified: Secondary | ICD-10-CM | POA: Diagnosis not present

## 2020-10-25 DIAGNOSIS — N189 Chronic kidney disease, unspecified: Secondary | ICD-10-CM | POA: Diagnosis not present

## 2020-10-25 DIAGNOSIS — Z90710 Acquired absence of both cervix and uterus: Secondary | ICD-10-CM | POA: Insufficient documentation

## 2020-10-25 DIAGNOSIS — Z8542 Personal history of malignant neoplasm of other parts of uterus: Secondary | ICD-10-CM | POA: Diagnosis not present

## 2020-10-25 DIAGNOSIS — Z90722 Acquired absence of ovaries, bilateral: Secondary | ICD-10-CM | POA: Insufficient documentation

## 2020-10-25 DIAGNOSIS — Z79890 Hormone replacement therapy (postmenopausal): Secondary | ICD-10-CM | POA: Diagnosis not present

## 2020-10-25 DIAGNOSIS — I509 Heart failure, unspecified: Secondary | ICD-10-CM | POA: Insufficient documentation

## 2020-10-25 DIAGNOSIS — C541 Malignant neoplasm of endometrium: Secondary | ICD-10-CM

## 2020-10-25 DIAGNOSIS — E039 Hypothyroidism, unspecified: Secondary | ICD-10-CM | POA: Diagnosis not present

## 2020-10-25 DIAGNOSIS — I130 Hypertensive heart and chronic kidney disease with heart failure and stage 1 through stage 4 chronic kidney disease, or unspecified chronic kidney disease: Secondary | ICD-10-CM | POA: Diagnosis not present

## 2020-10-25 NOTE — Patient Instructions (Addendum)
It was good to see you today!  As we talked about today, since you are a year out from your surgery, we are going to transition to visits every year.  It is important that in between visits, if you develop any new symptoms, such as vaginal bleeding, pelvic pain, change to bowel function, or unintentional weight loss, that you call me to come in sooner.  My schedule is not out for 2023.  Please call sometime in November to get a visit scheduled with me for next June.

## 2020-10-26 ENCOUNTER — Inpatient Hospital Stay (HOSPITAL_COMMUNITY): Payer: Medicare Other | Attending: Hematology

## 2020-10-26 DIAGNOSIS — N184 Chronic kidney disease, stage 4 (severe): Secondary | ICD-10-CM | POA: Diagnosis not present

## 2020-10-26 DIAGNOSIS — D631 Anemia in chronic kidney disease: Secondary | ICD-10-CM | POA: Insufficient documentation

## 2020-10-26 DIAGNOSIS — Z8542 Personal history of malignant neoplasm of other parts of uterus: Secondary | ICD-10-CM | POA: Insufficient documentation

## 2020-10-26 LAB — CBC WITH DIFFERENTIAL/PLATELET
Abs Immature Granulocytes: 0.03 10*3/uL (ref 0.00–0.07)
Basophils Absolute: 0 10*3/uL (ref 0.0–0.1)
Basophils Relative: 0 %
Eosinophils Absolute: 0.3 10*3/uL (ref 0.0–0.5)
Eosinophils Relative: 4 %
HCT: 36.8 % (ref 36.0–46.0)
Hemoglobin: 11.5 g/dL — ABNORMAL LOW (ref 12.0–15.0)
Immature Granulocytes: 0 %
Lymphocytes Relative: 17 %
Lymphs Abs: 1.4 10*3/uL (ref 0.7–4.0)
MCH: 32.7 pg (ref 26.0–34.0)
MCHC: 31.3 g/dL (ref 30.0–36.0)
MCV: 104.5 fL — ABNORMAL HIGH (ref 80.0–100.0)
Monocytes Absolute: 0.4 10*3/uL (ref 0.1–1.0)
Monocytes Relative: 4 %
Neutro Abs: 6.3 10*3/uL (ref 1.7–7.7)
Neutrophils Relative %: 75 %
Platelets: 213 10*3/uL (ref 150–400)
RBC: 3.52 MIL/uL — ABNORMAL LOW (ref 3.87–5.11)
RDW: 13.5 % (ref 11.5–15.5)
WBC: 8.5 10*3/uL (ref 4.0–10.5)
nRBC: 0 % (ref 0.0–0.2)

## 2020-10-26 LAB — IRON AND TIBC
Iron: 52 ug/dL (ref 28–170)
Saturation Ratios: 20 % (ref 10.4–31.8)
TIBC: 265 ug/dL (ref 250–450)
UIBC: 213 ug/dL

## 2020-10-26 LAB — COMPREHENSIVE METABOLIC PANEL
ALT: 16 U/L (ref 0–44)
AST: 16 U/L (ref 15–41)
Albumin: 3.6 g/dL (ref 3.5–5.0)
Alkaline Phosphatase: 82 U/L (ref 38–126)
Anion gap: 6 (ref 5–15)
BUN: 36 mg/dL — ABNORMAL HIGH (ref 8–23)
CO2: 24 mmol/L (ref 22–32)
Calcium: 9.1 mg/dL (ref 8.9–10.3)
Chloride: 110 mmol/L (ref 98–111)
Creatinine, Ser: 3.15 mg/dL — ABNORMAL HIGH (ref 0.44–1.00)
GFR, Estimated: 16 mL/min — ABNORMAL LOW (ref >60)
Glucose, Bld: 92 mg/dL (ref 70–99)
Potassium: 4.1 mmol/L (ref 3.5–5.1)
Sodium: 140 mmol/L (ref 135–145)
Total Bilirubin: 0.3 mg/dL (ref 0.3–1.2)
Total Protein: 7 g/dL (ref 6.5–8.1)

## 2020-10-26 LAB — FERRITIN: Ferritin: 299 ng/mL (ref 11–307)

## 2020-10-26 LAB — VITAMIN B12: Vitamin B-12: 552 pg/mL (ref 180–914)

## 2020-11-02 ENCOUNTER — Encounter (HOSPITAL_COMMUNITY): Payer: Self-pay | Admitting: Hematology and Oncology

## 2020-11-02 ENCOUNTER — Inpatient Hospital Stay (HOSPITAL_COMMUNITY): Payer: Medicare Other | Admitting: Hematology and Oncology

## 2020-11-02 ENCOUNTER — Other Ambulatory Visit: Payer: Self-pay

## 2020-11-02 VITALS — BP 123/67 | HR 73 | Temp 96.9°F | Resp 19 | Wt 227.6 lb

## 2020-11-02 DIAGNOSIS — N189 Chronic kidney disease, unspecified: Secondary | ICD-10-CM

## 2020-11-02 DIAGNOSIS — N184 Chronic kidney disease, stage 4 (severe): Secondary | ICD-10-CM

## 2020-11-02 DIAGNOSIS — C541 Malignant neoplasm of endometrium: Secondary | ICD-10-CM | POA: Diagnosis not present

## 2020-11-02 DIAGNOSIS — D631 Anemia in chronic kidney disease: Secondary | ICD-10-CM | POA: Diagnosis not present

## 2020-11-02 NOTE — Assessment & Plan Note (Signed)
I have reviewed documentation by GYN oncologist She will continue close surveillance

## 2020-11-02 NOTE — Progress Notes (Signed)
[name] FOLLOW-UP progress notes  Patient Care Team: [name], MD as PCP - General (Internal Medicine) Fran Lowes, MD (Inactive) as Attending Physician (Nephrology)  CHIEF COMPLAINTS/PURPOSE OF VISIT:  Anemia chronic renal failure, endometrial cancer, for further management  HISTORY OF PRESENTING ILLNESS:  Shannon [name] 65 y.o. female is seen in the absence of her primary oncologist The patient has anemia chronic renal failure and had received erythropoietin stimulating agent in the past She also had diagnosis of early stage endometrial cancer status post surgery She returns for further follow-up She has great energy level No recent signs of bleeding She had normal exam with GYN surgeon recently  I reviewed the patient's records extensive and collaborated the history with the patient. Summary of her history is as follows: Oncology History Overview Note  MSI-stable   Endometrial/uterine adenocarcinoma ([name])  08/13/2019 Initial Biopsy   CKC and D&C - CKC negativ, EMB showed FIGO gr1 EMCA   08/13/2019 Initial Diagnosis   Endometrial/uterine adenocarcinoma ([name])   09/24/2019 Surgery   TRH/BSO, SLN   09/24/2019 Pathology Results   IA, grade 1, endometrioid adenoca; DOI 1/47m, no LVSI, SLNs negative   09/24/2019 Cancer Staging   Staging form: Corpus Uteri - Carcinoma and Carcinosarcoma, AJCC 8th Edition - Clinical stage from 09/24/2019: FIGO Stage IA (cT1a, cN0(sn), cM0) - Signed by [name] MD on 09/29/2019      MEDICAL HISTORY:  Past Medical History:  Diagnosis Date   Absolute anemia 11/10/2015   Anemia    Anemia of renal disease 11/10/2015   Asthma    has Albuterol inhaler prn   CHF (congestive heart failure) ([place]    [facility] systolic [facility] 81/6109[facility]   Chronic kidney disease    [facility]   Endometrial adenocarcinoma ([place]    Gastric ulcer    History of blood transfusion    no abnormal reaction  noted   History of bronchitis    last time 3-467yrago   Hyperlipidemia    takes Pravastatin daily   Hypertension    takes Imdur and Hydralazine daily as well as Coreg   Hypothyroidism    takes Synthroid daily   Itching    on legs-has a Kenalog cream   Metabolic bone disease    Nausea    takes Zofran prn   Nonischemic cardiomyopathy ([place]   12/2011 normal coronaries, EF 22% (EF normal 04/2013)   Obesity    Peripheral edema    takes Torsemide daily   Thyroid disease    hypothyroidism    SURGICAL HISTORY: Past Surgical History:  Procedure Laterality Date   AV FISTULA PLACEMENT Left 07/22/2012   Procedure: INSERTION OF ARTERIOVENOUS GORE-TEX GRAFT ARM;  Surgeon: ChAngelia MouldMD;  [place] Service: Vascular;  Laterality: Left;   AV FISTULA PLACEMENT Right 05/24/2017   Procedure: INSERTION OF ARTERIOVENOUS (AV) GORE-TEX GRAFT RIGHT ARM;  Surgeon: DiAngelia MouldMD;  [place] [facility] Service: [place];  Laterality: Right;   CESAREAN SECTION     30+yrs ago   COLONOSCOPY     DILATION AND CURETTAGE, DIAGNOSTIC / THERAPEUTIC     left arm surgery with pin     15+yrs ago   ROBOTIC ASSISTED TOTAL HYSTERECTOMY WITH BILATERAL SALPINGO OOPHERECTOMY Bilateral 09/24/2019   Procedure: XI ROBOTIC ASSISTED TOTAL HYSTERECTOMY WITH BILATERAL SALPINGO OOPHORECTOMY;  Surgeon: TuLafonda MossesMD;  [facility];  Service: Gynecology;  Laterality: Bilateral;  SENTINEL NODE BIOPSY N/A 09/24/2019   Procedure: SENTINEL NODE BIOPSY;  Surgeon: [name], MD;  [facility];  Service: Gynecology;  Laterality: N/A;   TUBAL LIGATION      SOCIAL HISTORY: Social History   Socioeconomic History   Marital status: Single    Spouse name: Not on file   Number of children: Not on file   Years of education: Not on file   Highest education level: Not on file  Occupational History   Not on file  Tobacco Use   Smoking status: Every Day    Packs/day: 1.00     Years: 40.00    Pack years: 40.00    Types: Cigarettes    Start date: 05/14/1968   Smokeless tobacco: Never  Vaping Use   Vaping Use: Never used  Substance and Sexual Activity   Alcohol use: No   Drug use: No   Sexual activity: Not Currently  Other Topics Concern   Not on file  Social History Narrative   Not on file   Social Determinants of Health   Financial Resource Strain: Not on file  Food Insecurity: Not on file  Transportation Needs: Not on file  Physical Activity: Not on file  Stress: Not on file  Social Connections: Not on file  Intimate Partner Violence: Not on file    FAMILY HISTORY: Family History  Problem Relation Age of Onset   Kidney disease Other    Diabetes Mother    Breast cancer Neg Hx    Colon cancer Neg Hx    Uterine cancer Neg Hx    Ovarian cancer Neg Hx     ALLERGIES:  is allergic to corticosteroids, other, and rituximab.  MEDICATIONS:  Current Outpatient Medications  Medication Sig Dispense Refill   acetaminophen (TYLENOL) 500 MG tablet Take 1,000 mg by mouth daily as needed for moderate pain or headache.     albuterol (PROVENTIL HFA;VENTOLIN HFA) 108 (90 BASE) MCG/ACT inhaler Inhale 1-2 puffs into the lungs every 6 (six) hours as needed for wheezing.      augmented betamethasone dipropionate (DIPROLENE-AF) 0.05 % cream Apply 1 application topically 2 (two) times daily as needed (skin irritation.).      carvedilol (COREG) 12.5 MG tablet Take 12.5 mg by mouth 2 (two) times daily.     clobetasol cream (TEMOVATE) 3.54 % Apply 1 application topically daily as needed (rash).      hydrALAZINE (APRESOLINE) 50 MG tablet Take 50 mg by mouth 3 (three) times daily.      isosorbide mononitrate (IMDUR) 30 MG 24 hr tablet Take 30 mg by mouth daily.     metolazone (ZAROXOLYN) 5 MG tablet Take 5 mg by mouth 2 (two) times daily as needed (swelling).      pravastatin (PRAVACHOL) 80 MG tablet Take 80 mg by mouth daily.     raloxifene (EVISTA) 60 MG tablet Take 60  mg by mouth daily.     RAYALDEE 30 MCG CPCR Take 30 mcg by mouth daily.      sodium bicarbonate 650 MG tablet Take 1,300 mg by mouth 3 (three) times daily.      torsemide (DEMADEX) 20 MG tablet Take 20 mg by mouth daily as needed (swelling).     No current facility-administered medications for this visit.    REVIEW OF SYSTEMS:   Constitutional: Denies fevers, chills or abnormal night sweats Eyes: Denies blurriness of vision, double vision or watery eyes Ears, nose, mouth, throat, and face: Denies mucositis or  sore throat Respiratory: Denies cough, dyspnea or wheezes Cardiovascular: Denies palpitation, chest discomfort or lower extremity swelling Gastrointestinal:  Denies nausea, heartburn or change in bowel habits Skin: Denies abnormal skin rashes Lymphatics: Denies new lymphadenopathy or easy bruising Neurological:Denies numbness, tingling or new weaknesses Behavioral/Psych: Mood is stable, no new changes  All other systems were reviewed with the patient and are negative.  PHYSICAL EXAMINATION: ECOG PERFORMANCE STATUS: 1 - Symptomatic but completely ambulatory  Vitals:   11/02/20 1113  BP: 123/67  Pulse: 73  Resp: 19  Temp: (!) 96.9 F (36.1 C)  SpO2: 99%   Filed Weights   11/02/20 1113  Weight: 227 lb 9.6 oz (103.2 kg)    GENERAL:alert, no distress and comfortable PSYCH: alert & oriented x 3 with fluent speech NEURO: no focal motor/sensory deficits  LABORATORY DATA:  I have reviewed the data as listed Lab Results  Component Value Date   WBC 8.5 10/26/2020   HGB 11.5 (L) 10/26/2020   HCT 36.8 10/26/2020   MCV 104.5 (H) 10/26/2020   PLT 213 10/26/2020   Recent Labs    01/15/20 1020 03/28/20 0846 04/11/20 1346 06/06/20 1227 07/04/20 0942 07/18/20 0953 08/30/20 1051 10/11/20 0813 10/26/20 1020  NA 139   < > 139   < > 143   < > 137 140 140  K 4.1   < > 3.9   < > 4.1   < > 4.1 4.0 4.1  CL 109   < > 108   < > 110   < > 107 109 110  CO2 21*   < > 21*   < >  25   < > '22 23 24  ' GLUCOSE 102*   < > 85   < > 101*   < > 93 105* 92  BUN 34*   < > 36*   < > 39*   < > 32* 34* 36*  CREATININE 3.81*   < > 3.87*   < > 3.39*   < > 3.44* 3.41* 3.15*  CALCIUM 8.7*   < > 8.9   < > 9.1   < > 9.0 9.2 9.1  GFRNONAA 12*   < > 12*   < > 14*   < > 14* 14* 16*  GFRAA 14*  --   --   --   --   --   --   --   --   PROT 6.4*  --  7.2  --  6.9  --   --   --  7.0  ALBUMIN 3.4*   < > 3.5   < > 3.4*   < > 3.5 3.6 3.6  AST 21  --  17  --  16  --   --   --  16  ALT 13  --  14  --  14  --   --   --  16  ALKPHOS 60  --  73  --  76  --   --   --  82  BILITOT 0.5  --  0.4  --  0.5  --   --   --  0.3   < > = values in this interval not displayed.    ASSESSMENT & PLAN:  Endometrial/uterine adenocarcinoma ([place]) I have reviewed documentation by GYN oncologist She will continue close surveillance  Anemia associated with chronic renal failure Her blood count is satisfactory She does not need ESA or IV iron I recommend increasing interval of follow-up  to 6 months and if her blood count remained stable in her next visit, we can change it to a yearly visit  Chronic kidney disease, stage IV (severe) ([place]) Her recent renal function is satisfactory We discussed the importance of adequate hydration and risk factor modification  Orders Placed This Encounter  Procedures   CBC with Differential    Standing Status:   Future    Standing Expiration Date:   11/02/2021   Comprehensive metabolic panel    Standing Status:   Future    Standing Expiration Date:   11/02/2021   Iron and TIBC    Standing Status:   Future    Standing Expiration Date:   11/02/2021   Ferritin    Standing Status:   Future    Standing Expiration Date:   11/02/2021   Vitamin B12    Standing Status:   Future    Standing Expiration Date:   11/02/2021    All questions were answered. The patient knows to call the clinic with any problems, questions or concerns. The total time spent in the appointment was 20 minutes  encounter with patients including review of chart and various tests results, discussions about plan of care and coordination of care plan   Heath Lark, MD 11/02/2020 12:04 PM

## 2020-11-02 NOTE — Assessment & Plan Note (Signed)
Her recent renal function is satisfactory We discussed the importance of adequate hydration and risk factor modification

## 2020-11-02 NOTE — Assessment & Plan Note (Signed)
Her blood count is satisfactory She does not need ESA or IV iron I recommend increasing interval of follow-up to 6 months and if her blood count remained stable in her next visit, we can change it to a yearly visit

## 2020-11-28 ENCOUNTER — Other Ambulatory Visit (HOSPITAL_COMMUNITY)
Admission: RE | Admit: 2020-11-28 | Discharge: 2020-11-28 | Disposition: A | Discharge status: Home / Self Care | Payer: Medicare Other | Source: Ambulatory Visit | Attending: Nephrology | Admitting: Nephrology

## 2020-11-28 ENCOUNTER — Other Ambulatory Visit: Payer: Self-pay

## 2020-11-28 DIAGNOSIS — N185 Chronic kidney disease, stage 5: Secondary | ICD-10-CM | POA: Insufficient documentation

## 2020-11-28 DIAGNOSIS — N032 Chronic nephritic syndrome with diffuse membranous glomerulonephritis: Secondary | ICD-10-CM | POA: Insufficient documentation

## 2020-11-28 DIAGNOSIS — R809 Proteinuria, unspecified: Secondary | ICD-10-CM | POA: Diagnosis not present

## 2020-11-28 DIAGNOSIS — D631 Anemia in chronic kidney disease: Secondary | ICD-10-CM | POA: Diagnosis not present

## 2020-11-28 LAB — RENAL FUNCTION PANEL
Albumin: 3.6 g/dL (ref 3.5–5.0)
Anion gap: 8 (ref 5–15)
BUN: 36 mg/dL — ABNORMAL HIGH (ref 8–23)
CO2: 24 mmol/L (ref 22–32)
Calcium: 8.9 mg/dL (ref 8.9–10.3)
Chloride: 107 mmol/L (ref 98–111)
Creatinine, Ser: 3.37 mg/dL — ABNORMAL HIGH (ref 0.44–1.00)
GFR, Estimated: 14 mL/min — ABNORMAL LOW
Glucose, Bld: 100 mg/dL — ABNORMAL HIGH (ref 70–99)
Phosphorus: 4.1 mg/dL (ref 2.5–4.6)
Potassium: 4.3 mmol/L (ref 3.5–5.1)
Sodium: 139 mmol/L (ref 135–145)

## 2020-11-28 LAB — CBC
HCT: 36.9 % (ref 36.0–46.0)
Hemoglobin: 11.5 g/dL — ABNORMAL LOW (ref 12.0–15.0)
MCH: 32.5 pg (ref 26.0–34.0)
MCHC: 31.2 g/dL (ref 30.0–36.0)
MCV: 104.2 fL — ABNORMAL HIGH (ref 80.0–100.0)
Platelets: 229 K/uL (ref 150–400)
RBC: 3.54 MIL/uL — ABNORMAL LOW (ref 3.87–5.11)
RDW: 13.2 % (ref 11.5–15.5)
WBC: 7.6 K/uL (ref 4.0–10.5)
nRBC: 0 % (ref 0.0–0.2)

## 2020-11-28 LAB — PROTEIN / CREATININE RATIO, URINE
Creatinine, Urine: 96.38 mg/dL
Protein Creatinine Ratio: 0.6 mg/mg{Cre} — ABNORMAL HIGH (ref 0.00–0.15)
Total Protein, Urine: 58 mg/dL

## 2020-12-02 DIAGNOSIS — R809 Proteinuria, unspecified: Secondary | ICD-10-CM | POA: Diagnosis not present

## 2020-12-02 DIAGNOSIS — I129 Hypertensive chronic kidney disease with stage 1 through stage 4 chronic kidney disease, or unspecified chronic kidney disease: Secondary | ICD-10-CM | POA: Diagnosis not present

## 2020-12-02 DIAGNOSIS — D631 Anemia in chronic kidney disease: Secondary | ICD-10-CM | POA: Diagnosis not present

## 2020-12-02 DIAGNOSIS — N185 Chronic kidney disease, stage 5: Secondary | ICD-10-CM | POA: Diagnosis not present

## 2020-12-02 DIAGNOSIS — N032 Chronic nephritic syndrome with diffuse membranous glomerulonephritis: Secondary | ICD-10-CM | POA: Diagnosis not present

## 2020-12-02 DIAGNOSIS — N189 Chronic kidney disease, unspecified: Secondary | ICD-10-CM | POA: Diagnosis not present

## 2020-12-21 DIAGNOSIS — Z79899 Other long term (current) drug therapy: Secondary | ICD-10-CM | POA: Diagnosis not present

## 2020-12-21 DIAGNOSIS — Z7189 Other specified counseling: Secondary | ICD-10-CM | POA: Diagnosis not present

## 2020-12-21 DIAGNOSIS — I509 Heart failure, unspecified: Secondary | ICD-10-CM | POA: Diagnosis not present

## 2020-12-21 DIAGNOSIS — Z0000 Encounter for general adult medical examination without abnormal findings: Secondary | ICD-10-CM | POA: Diagnosis not present

## 2020-12-21 DIAGNOSIS — R5383 Other fatigue: Secondary | ICD-10-CM | POA: Diagnosis not present

## 2020-12-21 DIAGNOSIS — F17210 Nicotine dependence, cigarettes, uncomplicated: Secondary | ICD-10-CM | POA: Diagnosis not present

## 2020-12-21 DIAGNOSIS — Z299 Encounter for prophylactic measures, unspecified: Secondary | ICD-10-CM | POA: Diagnosis not present

## 2020-12-21 DIAGNOSIS — I10 Essential (primary) hypertension: Secondary | ICD-10-CM | POA: Diagnosis not present

## 2020-12-21 DIAGNOSIS — E7800 Pure hypercholesterolemia, unspecified: Secondary | ICD-10-CM | POA: Diagnosis not present

## 2021-01-11 DIAGNOSIS — K219 Gastro-esophageal reflux disease without esophagitis: Secondary | ICD-10-CM | POA: Diagnosis not present

## 2021-01-11 DIAGNOSIS — E039 Hypothyroidism, unspecified: Secondary | ICD-10-CM | POA: Diagnosis not present

## 2021-02-02 DIAGNOSIS — I10 Essential (primary) hypertension: Secondary | ICD-10-CM | POA: Diagnosis not present

## 2021-02-02 DIAGNOSIS — I509 Heart failure, unspecified: Secondary | ICD-10-CM | POA: Diagnosis not present

## 2021-02-02 DIAGNOSIS — M25512 Pain in left shoulder: Secondary | ICD-10-CM | POA: Diagnosis not present

## 2021-02-02 DIAGNOSIS — Z299 Encounter for prophylactic measures, unspecified: Secondary | ICD-10-CM | POA: Diagnosis not present

## 2021-02-02 DIAGNOSIS — Z23 Encounter for immunization: Secondary | ICD-10-CM | POA: Diagnosis not present

## 2021-02-06 ENCOUNTER — Other Ambulatory Visit: Payer: Self-pay

## 2021-02-06 ENCOUNTER — Other Ambulatory Visit (HOSPITAL_COMMUNITY)
Admission: RE | Admit: 2021-02-06 | Discharge: 2021-02-06 | Disposition: A | Discharge status: Home / Self Care | Payer: Medicare Other | Source: Ambulatory Visit | Attending: Nephrology | Admitting: Nephrology

## 2021-02-06 DIAGNOSIS — I129 Hypertensive chronic kidney disease with stage 1 through stage 4 chronic kidney disease, or unspecified chronic kidney disease: Secondary | ICD-10-CM | POA: Diagnosis not present

## 2021-02-06 DIAGNOSIS — R809 Proteinuria, unspecified: Secondary | ICD-10-CM | POA: Insufficient documentation

## 2021-02-06 DIAGNOSIS — N189 Chronic kidney disease, unspecified: Secondary | ICD-10-CM | POA: Insufficient documentation

## 2021-02-06 DIAGNOSIS — N032 Chronic nephritic syndrome with diffuse membranous glomerulonephritis: Secondary | ICD-10-CM | POA: Insufficient documentation

## 2021-02-06 DIAGNOSIS — D631 Anemia in chronic kidney disease: Secondary | ICD-10-CM | POA: Insufficient documentation

## 2021-02-06 DIAGNOSIS — N185 Chronic kidney disease, stage 5: Secondary | ICD-10-CM | POA: Diagnosis not present

## 2021-02-06 LAB — RENAL FUNCTION PANEL
Albumin: 3.5 g/dL (ref 3.5–5.0)
Anion gap: 7 (ref 5–15)
BUN: 38 mg/dL — ABNORMAL HIGH (ref 8–23)
CO2: 23 mmol/L (ref 22–32)
Calcium: 8.8 mg/dL — ABNORMAL LOW (ref 8.9–10.3)
Chloride: 109 mmol/L (ref 98–111)
Creatinine, Ser: 3.44 mg/dL — ABNORMAL HIGH (ref 0.44–1.00)
GFR, Estimated: 14 mL/min — ABNORMAL LOW (ref >60)
Glucose, Bld: 104 mg/dL — ABNORMAL HIGH (ref 70–99)
Phosphorus: 3.9 mg/dL (ref 2.5–4.6)
Potassium: 3.8 mmol/L (ref 3.5–5.1)
Sodium: 139 mmol/L (ref 135–145)

## 2021-02-06 LAB — CBC
HCT: 36.9 % (ref 36.0–46.0)
Hemoglobin: 11.8 g/dL — ABNORMAL LOW (ref 12.0–15.0)
MCH: 33.1 pg (ref 26.0–34.0)
MCHC: 32 g/dL (ref 30.0–36.0)
MCV: 103.4 fL — ABNORMAL HIGH (ref 80.0–100.0)
Platelets: 221 10*3/uL (ref 150–400)
RBC: 3.57 MIL/uL — ABNORMAL LOW (ref 3.87–5.11)
RDW: 13.6 % (ref 11.5–15.5)
WBC: 8 10*3/uL (ref 4.0–10.5)
nRBC: 0 % (ref 0.0–0.2)

## 2021-02-07 LAB — PARATHYROID HORMONE, INTACT (NO CA): PTH: 57 pg/mL (ref 15–65)

## 2021-02-08 DIAGNOSIS — M19012 Primary osteoarthritis, left shoulder: Secondary | ICD-10-CM | POA: Diagnosis not present

## 2021-02-08 DIAGNOSIS — M75102 Unspecified rotator cuff tear or rupture of left shoulder, not specified as traumatic: Secondary | ICD-10-CM | POA: Diagnosis not present

## 2021-02-08 DIAGNOSIS — M7592 Shoulder lesion, unspecified, left shoulder: Secondary | ICD-10-CM | POA: Diagnosis not present

## 2021-02-10 DIAGNOSIS — I129 Hypertensive chronic kidney disease with stage 1 through stage 4 chronic kidney disease, or unspecified chronic kidney disease: Secondary | ICD-10-CM | POA: Diagnosis not present

## 2021-02-10 DIAGNOSIS — N185 Chronic kidney disease, stage 5: Secondary | ICD-10-CM | POA: Diagnosis not present

## 2021-02-10 DIAGNOSIS — R809 Proteinuria, unspecified: Secondary | ICD-10-CM | POA: Diagnosis not present

## 2021-02-10 DIAGNOSIS — N032 Chronic nephritic syndrome with diffuse membranous glomerulonephritis: Secondary | ICD-10-CM | POA: Diagnosis not present

## 2021-02-10 DIAGNOSIS — N189 Chronic kidney disease, unspecified: Secondary | ICD-10-CM | POA: Diagnosis not present

## 2021-02-10 DIAGNOSIS — D631 Anemia in chronic kidney disease: Secondary | ICD-10-CM | POA: Diagnosis not present

## 2021-02-15 DIAGNOSIS — M75102 Unspecified rotator cuff tear or rupture of left shoulder, not specified as traumatic: Secondary | ICD-10-CM | POA: Diagnosis not present

## 2021-02-15 DIAGNOSIS — M19012 Primary osteoarthritis, left shoulder: Secondary | ICD-10-CM | POA: Diagnosis not present

## 2021-02-15 DIAGNOSIS — Z9889 Other specified postprocedural states: Secondary | ICD-10-CM | POA: Diagnosis not present

## 2021-02-17 DIAGNOSIS — M75102 Unspecified rotator cuff tear or rupture of left shoulder, not specified as traumatic: Secondary | ICD-10-CM | POA: Diagnosis not present

## 2021-02-17 DIAGNOSIS — M19012 Primary osteoarthritis, left shoulder: Secondary | ICD-10-CM | POA: Diagnosis not present

## 2021-02-17 DIAGNOSIS — Z9889 Other specified postprocedural states: Secondary | ICD-10-CM | POA: Diagnosis not present

## 2021-02-22 DIAGNOSIS — Z9889 Other specified postprocedural states: Secondary | ICD-10-CM | POA: Diagnosis not present

## 2021-02-22 DIAGNOSIS — M75102 Unspecified rotator cuff tear or rupture of left shoulder, not specified as traumatic: Secondary | ICD-10-CM | POA: Diagnosis not present

## 2021-02-22 DIAGNOSIS — M19012 Primary osteoarthritis, left shoulder: Secondary | ICD-10-CM | POA: Diagnosis not present

## 2021-02-24 DIAGNOSIS — M75102 Unspecified rotator cuff tear or rupture of left shoulder, not specified as traumatic: Secondary | ICD-10-CM | POA: Diagnosis not present

## 2021-02-24 DIAGNOSIS — Z9889 Other specified postprocedural states: Secondary | ICD-10-CM | POA: Diagnosis not present

## 2021-02-24 DIAGNOSIS — M19012 Primary osteoarthritis, left shoulder: Secondary | ICD-10-CM | POA: Diagnosis not present

## 2021-03-01 DIAGNOSIS — Z9889 Other specified postprocedural states: Secondary | ICD-10-CM | POA: Diagnosis not present

## 2021-03-01 DIAGNOSIS — M75102 Unspecified rotator cuff tear or rupture of left shoulder, not specified as traumatic: Secondary | ICD-10-CM | POA: Diagnosis not present

## 2021-03-01 DIAGNOSIS — M19012 Primary osteoarthritis, left shoulder: Secondary | ICD-10-CM | POA: Diagnosis not present

## 2021-03-03 DIAGNOSIS — M75102 Unspecified rotator cuff tear or rupture of left shoulder, not specified as traumatic: Secondary | ICD-10-CM | POA: Diagnosis not present

## 2021-03-03 DIAGNOSIS — M19012 Primary osteoarthritis, left shoulder: Secondary | ICD-10-CM | POA: Diagnosis not present

## 2021-03-03 DIAGNOSIS — Z9889 Other specified postprocedural states: Secondary | ICD-10-CM | POA: Diagnosis not present

## 2021-03-08 DIAGNOSIS — Z9889 Other specified postprocedural states: Secondary | ICD-10-CM | POA: Diagnosis not present

## 2021-03-08 DIAGNOSIS — M75102 Unspecified rotator cuff tear or rupture of left shoulder, not specified as traumatic: Secondary | ICD-10-CM | POA: Diagnosis not present

## 2021-03-08 DIAGNOSIS — M19012 Primary osteoarthritis, left shoulder: Secondary | ICD-10-CM | POA: Diagnosis not present

## 2021-03-14 DIAGNOSIS — Z299 Encounter for prophylactic measures, unspecified: Secondary | ICD-10-CM | POA: Diagnosis not present

## 2021-03-14 DIAGNOSIS — R21 Rash and other nonspecific skin eruption: Secondary | ICD-10-CM | POA: Diagnosis not present

## 2021-03-14 DIAGNOSIS — M6740 Ganglion, unspecified site: Secondary | ICD-10-CM | POA: Diagnosis not present

## 2021-03-14 DIAGNOSIS — M792 Neuralgia and neuritis, unspecified: Secondary | ICD-10-CM | POA: Diagnosis not present

## 2021-03-14 DIAGNOSIS — J45909 Unspecified asthma, uncomplicated: Secondary | ICD-10-CM | POA: Diagnosis not present

## 2021-03-17 DIAGNOSIS — M75102 Unspecified rotator cuff tear or rupture of left shoulder, not specified as traumatic: Secondary | ICD-10-CM | POA: Diagnosis not present

## 2021-03-17 DIAGNOSIS — M25612 Stiffness of left shoulder, not elsewhere classified: Secondary | ICD-10-CM | POA: Diagnosis not present

## 2021-03-21 DIAGNOSIS — I10 Essential (primary) hypertension: Secondary | ICD-10-CM | POA: Diagnosis not present

## 2021-03-21 DIAGNOSIS — D582 Other hemoglobinopathies: Secondary | ICD-10-CM | POA: Diagnosis not present

## 2021-03-21 DIAGNOSIS — I509 Heart failure, unspecified: Secondary | ICD-10-CM | POA: Diagnosis not present

## 2021-03-21 DIAGNOSIS — G4700 Insomnia, unspecified: Secondary | ICD-10-CM | POA: Diagnosis not present

## 2021-03-21 DIAGNOSIS — Z299 Encounter for prophylactic measures, unspecified: Secondary | ICD-10-CM | POA: Diagnosis not present

## 2021-04-10 DIAGNOSIS — Z299 Encounter for prophylactic measures, unspecified: Secondary | ICD-10-CM | POA: Diagnosis not present

## 2021-04-10 DIAGNOSIS — Z2821 Immunization not carried out because of patient refusal: Secondary | ICD-10-CM | POA: Diagnosis not present

## 2021-04-10 DIAGNOSIS — R5383 Other fatigue: Secondary | ICD-10-CM | POA: Diagnosis not present

## 2021-04-10 DIAGNOSIS — R2231 Localized swelling, mass and lump, right upper limb: Secondary | ICD-10-CM | POA: Diagnosis not present

## 2021-04-13 ENCOUNTER — Other Ambulatory Visit: Payer: Self-pay

## 2021-04-13 ENCOUNTER — Other Ambulatory Visit (HOSPITAL_COMMUNITY)
Admission: RE | Admit: 2021-04-13 | Discharge: 2021-04-13 | Disposition: A | Discharge status: Home / Self Care | Payer: Medicare Other | Source: Ambulatory Visit | Attending: Nephrology | Admitting: Nephrology

## 2021-04-13 DIAGNOSIS — D631 Anemia in chronic kidney disease: Secondary | ICD-10-CM | POA: Insufficient documentation

## 2021-04-13 DIAGNOSIS — N189 Chronic kidney disease, unspecified: Secondary | ICD-10-CM | POA: Diagnosis not present

## 2021-04-13 DIAGNOSIS — R809 Proteinuria, unspecified: Secondary | ICD-10-CM | POA: Diagnosis not present

## 2021-04-13 DIAGNOSIS — I129 Hypertensive chronic kidney disease with stage 1 through stage 4 chronic kidney disease, or unspecified chronic kidney disease: Secondary | ICD-10-CM | POA: Insufficient documentation

## 2021-04-13 DIAGNOSIS — N032 Chronic nephritic syndrome with diffuse membranous glomerulonephritis: Secondary | ICD-10-CM | POA: Insufficient documentation

## 2021-04-13 DIAGNOSIS — N185 Chronic kidney disease, stage 5: Secondary | ICD-10-CM | POA: Diagnosis not present

## 2021-04-13 LAB — IRON AND TIBC
Iron: 82 ug/dL (ref 28–170)
Saturation Ratios: 32 % — ABNORMAL HIGH (ref 10.4–31.8)
TIBC: 253 ug/dL (ref 250–450)
UIBC: 171 ug/dL

## 2021-04-13 LAB — RENAL FUNCTION PANEL
Albumin: 3.5 g/dL (ref 3.5–5.0)
Anion gap: 6 (ref 5–15)
BUN: 30 mg/dL — ABNORMAL HIGH (ref 8–23)
CO2: 24 mmol/L (ref 22–32)
Calcium: 8.9 mg/dL (ref 8.9–10.3)
Chloride: 111 mmol/L (ref 98–111)
Creatinine, Ser: 3.2 mg/dL — ABNORMAL HIGH (ref 0.44–1.00)
GFR, Estimated: 15 mL/min — ABNORMAL LOW (ref >60)
Glucose, Bld: 93 mg/dL (ref 70–99)
Phosphorus: 3.3 mg/dL (ref 2.5–4.6)
Potassium: 4.4 mmol/L (ref 3.5–5.1)
Sodium: 141 mmol/L (ref 135–145)

## 2021-04-13 LAB — PROTEIN / CREATININE RATIO, URINE
Creatinine, Urine: 96.14 mg/dL
Protein Creatinine Ratio: 0.8 mg/mg{Cre} — ABNORMAL HIGH (ref 0.00–0.15)
Total Protein, Urine: 77 mg/dL

## 2021-04-13 LAB — CBC
HCT: 38.2 % (ref 36.0–46.0)
Hemoglobin: 12 g/dL (ref 12.0–15.0)
MCH: 32.5 pg (ref 26.0–34.0)
MCHC: 31.4 g/dL (ref 30.0–36.0)
MCV: 103.5 fL — ABNORMAL HIGH (ref 80.0–100.0)
Platelets: 190 10*3/uL (ref 150–400)
RBC: 3.69 MIL/uL — ABNORMAL LOW (ref 3.87–5.11)
RDW: 13.7 % (ref 11.5–15.5)
WBC: 8.4 10*3/uL (ref 4.0–10.5)
nRBC: 0 % (ref 0.0–0.2)

## 2021-04-13 LAB — FERRITIN: Ferritin: 291 ng/mL (ref 11–307)

## 2021-04-14 ENCOUNTER — Other Ambulatory Visit: Payer: Self-pay | Admitting: Internal Medicine

## 2021-04-14 DIAGNOSIS — Z1231 Encounter for screening mammogram for malignant neoplasm of breast: Secondary | ICD-10-CM

## 2021-04-17 ENCOUNTER — Other Ambulatory Visit: Payer: Self-pay

## 2021-04-17 ENCOUNTER — Ambulatory Visit
Admission: RE | Admit: 2021-04-17 | Discharge: 2021-04-17 | Disposition: A | Discharge status: Home / Self Care | Payer: Medicare Other | Source: Ambulatory Visit | Attending: Internal Medicine | Admitting: Internal Medicine

## 2021-04-17 DIAGNOSIS — Z1231 Encounter for screening mammogram for malignant neoplasm of breast: Secondary | ICD-10-CM | POA: Diagnosis not present

## 2021-04-21 DIAGNOSIS — N184 Chronic kidney disease, stage 4 (severe): Secondary | ICD-10-CM | POA: Diagnosis not present

## 2021-04-21 DIAGNOSIS — N189 Chronic kidney disease, unspecified: Secondary | ICD-10-CM | POA: Diagnosis not present

## 2021-04-21 DIAGNOSIS — N032 Chronic nephritic syndrome with diffuse membranous glomerulonephritis: Secondary | ICD-10-CM | POA: Diagnosis not present

## 2021-04-21 DIAGNOSIS — I129 Hypertensive chronic kidney disease with stage 1 through stage 4 chronic kidney disease, or unspecified chronic kidney disease: Secondary | ICD-10-CM | POA: Diagnosis not present

## 2021-04-21 DIAGNOSIS — R809 Proteinuria, unspecified: Secondary | ICD-10-CM | POA: Diagnosis not present

## 2021-04-21 DIAGNOSIS — D631 Anemia in chronic kidney disease: Secondary | ICD-10-CM | POA: Diagnosis not present

## 2021-05-06 ENCOUNTER — Encounter (HOSPITAL_COMMUNITY): Payer: Self-pay | Admitting: *Deleted

## 2021-05-06 ENCOUNTER — Emergency Department (HOSPITAL_COMMUNITY)
Admission: EM | Admit: 2021-05-06 | Discharge: 2021-05-06 | Disposition: A | Discharge status: Home / Self Care | Payer: Medicare Other | Attending: Emergency Medicine | Admitting: Emergency Medicine

## 2021-05-06 DIAGNOSIS — Z8542 Personal history of malignant neoplasm of other parts of uterus: Secondary | ICD-10-CM | POA: Insufficient documentation

## 2021-05-06 DIAGNOSIS — R059 Cough, unspecified: Secondary | ICD-10-CM | POA: Diagnosis present

## 2021-05-06 DIAGNOSIS — D631 Anemia in chronic kidney disease: Secondary | ICD-10-CM | POA: Insufficient documentation

## 2021-05-06 DIAGNOSIS — N186 End stage renal disease: Secondary | ICD-10-CM | POA: Diagnosis not present

## 2021-05-06 DIAGNOSIS — E039 Hypothyroidism, unspecified: Secondary | ICD-10-CM | POA: Diagnosis not present

## 2021-05-06 DIAGNOSIS — Z79899 Other long term (current) drug therapy: Secondary | ICD-10-CM | POA: Diagnosis not present

## 2021-05-06 DIAGNOSIS — F17210 Nicotine dependence, cigarettes, uncomplicated: Secondary | ICD-10-CM | POA: Diagnosis not present

## 2021-05-06 DIAGNOSIS — I132 Hypertensive heart and chronic kidney disease with heart failure and with stage 5 chronic kidney disease, or end stage renal disease: Secondary | ICD-10-CM | POA: Insufficient documentation

## 2021-05-06 DIAGNOSIS — J45901 Unspecified asthma with (acute) exacerbation: Secondary | ICD-10-CM

## 2021-05-06 DIAGNOSIS — I5021 Acute systolic (congestive) heart failure: Secondary | ICD-10-CM | POA: Insufficient documentation

## 2021-05-06 DIAGNOSIS — Z20822 Contact with and (suspected) exposure to covid-19: Secondary | ICD-10-CM | POA: Insufficient documentation

## 2021-05-06 LAB — RESP PANEL BY RT-PCR (FLU A&B, COVID) ARPGX2
Influenza A by PCR: NEGATIVE
Influenza B by PCR: NEGATIVE
SARS Coronavirus 2 by RT PCR: NEGATIVE

## 2021-05-06 MED ORDER — IPRATROPIUM-ALBUTEROL 0.5-2.5 (3) MG/3ML IN SOLN
3.0000 mL | Freq: Once | RESPIRATORY_TRACT | Status: AC
  Administered 2021-05-06: 17:00:00 3 mL via RESPIRATORY_TRACT
  Filled 2021-05-06: qty 3

## 2021-05-06 MED ORDER — DEXAMETHASONE SODIUM PHOSPHATE 10 MG/ML IJ SOLN: 10.0000 mg | Freq: Once | INTRAMUSCULAR | Status: DC

## 2021-05-06 MED ORDER — IPRATROPIUM-ALBUTEROL 0.5-2.5 (3) MG/3ML IN SOLN
3.0000 mL | Freq: Once | RESPIRATORY_TRACT | Status: AC
  Administered 2021-05-06: 16:00:00 3 mL via RESPIRATORY_TRACT
  Filled 2021-05-06: qty 3

## 2021-05-06 MED ORDER — DEXAMETHASONE SODIUM PHOSPHATE 10 MG/ML IJ SOLN
10.0000 mg | Freq: Once | INTRAMUSCULAR | Status: AC
  Administered 2021-05-06: 18:00:00 10 mg via INTRAMUSCULAR
  Filled 2021-05-06: qty 1

## 2021-05-06 NOTE — ED Triage Notes (Signed)
Productive cough with congestion x 2 days

## 2021-05-06 NOTE — ED Provider Notes (Signed)
[facility] EMERGENCY DEPARTMENT Provider Note   CSN: [id number] Arrival date & time: 05/06/21  1421     History Chief Complaint  Patient presents with   Cough    Shannon [name] is a 65 y.o. female with medical history of hypertension, asthma, anemia, CKD.  Patient states on Thursday she began to have chest congestion, cough.  Patient states that she has attempted to control the symptoms utilizing medications that she cannot member the name of with no success.  Patient states that she is vaccinated for COVID and flu.  Patient denies any known sick contacts.  Patient endorses cough, congestion, wheezing, shortness of breath.  Patient denies fevers, sore throat, nausea, vomiting, diarrhea, abdominal pain, chest pain.    Cough Associated symptoms: shortness of breath and wheezing   Associated symptoms: no chest pain, no chills, no fever, no headaches and no sore throat       Past Medical History:  Diagnosis Date   Absolute anemia 11/10/2015   Anemia    Anemia of renal disease 11/10/2015   Asthma    has Albuterol inhaler prn   CHF (congestive heart failure) ([place])    acute systolic CHF 11/[facility])   Chronic kidney disease    Membranous nephropathy ( Se[facility])   Endometrial adenocarcinoma ([place])    Gastric ulcer    History of blood transfusion    no abnormal reaction noted   History of bronchitis    last time 3-91yrs ago   Hyperlipidemia    takes Pravastatin daily   Hypertension    takes Imdur and Hydralazine daily as well as Coreg   Hypothyroidism    takes Synthroid daily   Itching    on legs-has a Kenalog cream   Metabolic bone disease    Nausea    takes Zofran prn   Nonischemic cardiomyopathy ([place])    12/2011 normal coronaries, EF 22% (EF normal 04/2013)   Obesity    Peripheral edema    takes Torsemide daily   Thyroid disease    hypothyroidism    Patient Active Problem List   Diagnosis Date Noted   CKD (chronic kidney disease), stage V ([place])  08/20/2019   Endometrial/uterine adenocarcinoma ([place]) 08/20/2019   Anemia associated with chronic renal failure 11/10/2015   Anemia of renal disease 11/10/2015   Cardiomyopathy ([place]) 04/21/2013   HTN (hypertension) 04/21/2013   Hyperlipidemia 04/21/2013   Tobacco abuse 04/21/2013   End stage renal disease ([place]) 08/06/2012   Chronic kidney disease, stage IV (severe) ([place]) 07/09/2012    Past Surgical History:  Procedure Laterality Date   AV FISTULA PLACEMENT Left 07/22/2012   Procedure: INSERTION OF ARTERIOVENOUS GORE-TEX GRAFT ARM;  Surgeon: Angelia Mould, MD;  Location: [place];  Service: Vascular;  Laterality: Left;   AV FISTULA PLACEMENT Right 05/24/2017   Procedure: INSERTION OF ARTERIOVENOUS (AV) GORE-TEX GRAFT RIGHT ARM;  Surgeon: Angelia Mould, MD;  Location: [place];  Service: Vascular;  Laterality: Right;   CESAREAN SECTION     30+yrs ago   COLONOSCOPY     DILATION AND CURETTAGE, DIAGNOSTIC / THERAPEUTIC     left arm surgery with pin     15+yrs ago   ROBOTIC ASSISTED TOTAL HYSTERECTOMY WITH BILATERAL SALPINGO OOPHERECTOMY Bilateral 09/24/2019   Procedure: XI ROBOTIC ASSISTED TOTAL HYSTERECTOMY WITH BILATERAL SALPINGO OOPHORECTOMY;  Surgeon: [name], MD;  [facility];  Service: Gynecology;  Laterality: Bilateral;   SENTINEL NODE BIOPSY N/A 09/24/2019   Procedure: [employer]  NODE BIOPSY;  Surgeon: [name], MD;  [facility];  Service: Gynecology;  Laterality: N/A;   TUBAL LIGATION       OB History     Gravida  2   Para  2   Term      Preterm      AB      Living         SAB      IAB      Ectopic      Multiple      Live Births              Family History  Problem Relation Age of Onset   Kidney disease Other    Diabetes Mother    Breast cancer Neg Hx    Colon cancer Neg Hx    Uterine cancer Neg Hx    Ovarian cancer Neg Hx     Social History   Tobacco Use   Smoking status: Every Day    Packs/day:  1.00    Years: 40.00    Pack years: 40.00    Types: Cigarettes    Start date: 05/14/1968   Smokeless tobacco: Never  Vaping Use   Vaping Use: Never used  Substance Use Topics   Alcohol use: No   Drug use: No    Home Medications Prior to Admission medications   Medication Sig Start Date End Date Taking? Authorizing Provider  acetaminophen (TYLENOL) 500 MG tablet Take 1,000 mg by mouth daily as needed for moderate pain or headache.    [provider]  albuterol (PROVENTIL HFA;VENTOLIN HFA) 108 (90 BASE) MCG/ACT inhaler Inhale 1-2 puffs into the lungs every 6 (six) hours as needed for wheezing.     [provider]  augmented betamethasone dipropionate (DIPROLENE-AF) 0.05 % cream Apply 1 application topically 2 (two) times daily as needed (skin irritation.).  10/13/18   [provider]  carvedilol (COREG) 12.5 MG tablet Take 12.5 mg by mouth 2 (two) times daily. 04/15/20   [provider]  clobetasol cream (TEMOVATE) 4.33 % Apply 1 application topically daily as needed (rash).  03/26/16   [provider]  hydrALAZINE (APRESOLINE) 50 MG tablet Take 50 mg by mouth 3 (three) times daily.     [provider]  isosorbide mononitrate (IMDUR) 30 MG 24 hr tablet Take 30 mg by mouth daily.    [provider]  metolazone (ZAROXOLYN) 5 MG tablet Take 5 mg by mouth 2 (two) times daily as needed (swelling).     [provider]  pravastatin (PRAVACHOL) 80 MG tablet Take 80 mg by mouth daily.    [provider]  raloxifene (EVISTA) 60 MG tablet Take 60 mg by mouth daily.    [provider]  RAYALDEE 30 MCG CPCR Take 30 mcg by mouth daily.  11/16/16   [provider]  sodium bicarbonate 650 MG tablet Take 1,300 mg by mouth 3 (three) times daily.     [provider]  torsemide (DEMADEX) 20 MG tablet Take 20 mg by mouth daily as needed (swelling).    [provider]    Allergies    Corticosteroids,  Other, and Rituximab  Review of Systems   Review of Systems  Constitutional:  Negative for chills and fever.  HENT:  Negative for sore throat.   Respiratory:  Positive for cough, shortness of breath and wheezing.   Cardiovascular:  Negative for chest pain.  Gastrointestinal:  Negative for abdominal  pain, diarrhea, nausea and vomiting.  Neurological:  Negative for headaches.  All other systems reviewed and are negative.  Physical Exam Updated Vital Signs BP (!) 141/56 (BP Location: Right Arm)    Pulse 66    Temp (!) 97.5 F (36.4 C) (Oral)    Resp 16    Ht 5\' 3"  (1.6 m)    Wt 101.4 kg    SpO2 95%    BMI 39.60 kg/m   Physical Exam Vitals and nursing note reviewed.  Constitutional:      General: She is not in acute distress.    Appearance: She is not ill-appearing or toxic-appearing.  HENT:     Head: Normocephalic and atraumatic.     Nose: Congestion present.     Mouth/Throat:     Mouth: Mucous membranes are moist.  Eyes:     Extraocular Movements: Extraocular movements intact.     Pupils: Pupils are equal, round, and reactive to light.  Cardiovascular:     Rate and Rhythm: Normal rate and regular rhythm.  Pulmonary:     Effort: Pulmonary effort is normal.     Breath sounds: Wheezing present.  Abdominal:     General: Abdomen is flat.     Palpations: Abdomen is soft.     Tenderness: There is no abdominal tenderness.  Musculoskeletal:     Cervical back: Normal range of motion.  Skin:    General: Skin is warm and dry.     Capillary Refill: Capillary refill takes less than 2 seconds.  Neurological:     Mental Status: She is alert.    ED Results / Procedures / Treatments   Labs (all labs ordered are listed, but only abnormal results are displayed) Labs Reviewed  RESP PANEL BY RT-PCR (FLU A&B, COVID) ARPGX2    EKG None  Radiology No results found.  Procedures Procedures   Medications Ordered in ED Medications  ipratropium-albuterol (DUONEB) 0.5-2.5 (3) MG/3ML  nebulizer solution 3 mL (3 mLs Nebulization Given 05/06/21 1554)  ipratropium-albuterol (DUONEB) 0.5-2.5 (3) MG/3ML nebulizer solution 3 mL (3 mLs Nebulization Given 05/06/21 1641)  dexamethasone (DECADRON) injection 10 mg (10 mg Intramuscular Given 05/06/21 1804)    ED Course  I have reviewed the triage vital signs and the nursing notes.  Pertinent labs & imaging results that were available during my care of the patient were reviewed by me and considered in my medical decision making (see chart for details).    MDM Rules/Calculators/A&P                         65 year old female here with cough, congestion, wheezing.  On examination, audible expiratory wheeze heard bilaterally.  Patient has history of asthma and has not used albuterol inhaler today.  Respiratory panel negative for COVID and flu.  Will treat wheezing with DuoNeb.  On chart review there is question at this patient has congestive heart failure.  I asked this patient and she denied any history of congestive heart failure.  On chart review there was also question about whether or not this patient had been a kidney transplant patient before.  Patient denies any kidney transplants but does endorse chronic any disease.  On reevaluation of patient there is more pronounced wheezing.  I will treat with another DuoNeb.  After second DuoNeb treatment patient reports cessation of symptoms. Patient states she no longer feels short of breath. Wheezing no longer present. I will also treat this patient with 10mg  Decadron  shot. Patient will be discharged and advised to follow up with PCP.   I discussed with the patient my belief that she had an asthma exacerbation causing cough, wheezing, shortness of breath. Patient expressed understanding and stated she would follow up with PCP. I provided this patient with return precautions which she voiced understanding with. This patient and her case has been discussed with Dr. Sabra Heck who agreed with  plan for discharge. This patient is stable on discharge.    Final Clinical Impression(s) / ED Diagnoses Final diagnoses:  Exacerbation of asthma, unspecified asthma severity, unspecified whether persistent    Rx / DC Orders ED Discharge Orders     None        Lawana Chambers 05/06/21 1830    Noemi Chapel, MD 05/08/21 1714

## 2021-05-06 NOTE — ED Notes (Signed)
ED Provider at bedside. 

## 2021-05-06 NOTE — Discharge Instructions (Addendum)
Return to ED if any new or worsening symptoms such as continued shortness of breath Utilize your at home albuterol inhaler for any future wheezing Follow-up with your PCP in the coming week to evaluate whether or not you are at home asthma regimen is sufficient

## 2021-05-09 DIAGNOSIS — F17210 Nicotine dependence, cigarettes, uncomplicated: Secondary | ICD-10-CM | POA: Diagnosis not present

## 2021-05-09 DIAGNOSIS — E7800 Pure hypercholesterolemia, unspecified: Secondary | ICD-10-CM | POA: Diagnosis not present

## 2021-05-09 DIAGNOSIS — J45909 Unspecified asthma, uncomplicated: Secondary | ICD-10-CM | POA: Diagnosis not present

## 2021-05-09 DIAGNOSIS — I509 Heart failure, unspecified: Secondary | ICD-10-CM | POA: Diagnosis not present

## 2021-05-09 DIAGNOSIS — Z299 Encounter for prophylactic measures, unspecified: Secondary | ICD-10-CM | POA: Diagnosis not present

## 2021-05-12 DIAGNOSIS — J40 Bronchitis, not specified as acute or chronic: Secondary | ICD-10-CM | POA: Diagnosis not present

## 2021-05-12 DIAGNOSIS — E7800 Pure hypercholesterolemia, unspecified: Secondary | ICD-10-CM | POA: Diagnosis not present

## 2021-05-17 ENCOUNTER — Telehealth: Payer: Self-pay

## 2021-05-17 NOTE — Telephone Encounter (Signed)
Returning patient call to schedule follow up appointment. Appointment scheduled for 10/12/21 at 1:00 pm. Patient is in agreement of appointment date and time. Instructed to call with any questions or concerns.

## 2021-05-21 ENCOUNTER — Encounter (HOSPITAL_COMMUNITY): Payer: Self-pay | Admitting: Hematology

## 2021-05-23 ENCOUNTER — Other Ambulatory Visit: Payer: Self-pay

## 2021-05-23 ENCOUNTER — Inpatient Hospital Stay (HOSPITAL_COMMUNITY): Payer: Commercial Managed Care - HMO | Attending: Hematology

## 2021-05-23 DIAGNOSIS — I130 Hypertensive heart and chronic kidney disease with heart failure and stage 1 through stage 4 chronic kidney disease, or unspecified chronic kidney disease: Secondary | ICD-10-CM | POA: Insufficient documentation

## 2021-05-23 DIAGNOSIS — E039 Hypothyroidism, unspecified: Secondary | ICD-10-CM | POA: Diagnosis not present

## 2021-05-23 DIAGNOSIS — D631 Anemia in chronic kidney disease: Secondary | ICD-10-CM | POA: Insufficient documentation

## 2021-05-23 DIAGNOSIS — E538 Deficiency of other specified B group vitamins: Secondary | ICD-10-CM | POA: Diagnosis not present

## 2021-05-23 DIAGNOSIS — N184 Chronic kidney disease, stage 4 (severe): Secondary | ICD-10-CM | POA: Insufficient documentation

## 2021-05-23 DIAGNOSIS — Z8542 Personal history of malignant neoplasm of other parts of uterus: Secondary | ICD-10-CM | POA: Insufficient documentation

## 2021-05-23 DIAGNOSIS — N189 Chronic kidney disease, unspecified: Secondary | ICD-10-CM

## 2021-05-23 LAB — CBC WITH DIFFERENTIAL/PLATELET
Abs Immature Granulocytes: 0.05 10*3/uL (ref 0.00–0.07)
Basophils Absolute: 0.1 10*3/uL (ref 0.0–0.1)
Basophils Relative: 1 %
Eosinophils Absolute: 0.2 10*3/uL (ref 0.0–0.5)
Eosinophils Relative: 3 %
HCT: 35.9 % — ABNORMAL LOW (ref 36.0–46.0)
Hemoglobin: 11.2 g/dL — ABNORMAL LOW (ref 12.0–15.0)
Immature Granulocytes: 1 %
Lymphocytes Relative: 14 %
Lymphs Abs: 1.3 10*3/uL (ref 0.7–4.0)
MCH: 32.6 pg (ref 26.0–34.0)
MCHC: 31.2 g/dL (ref 30.0–36.0)
MCV: 104.4 fL — ABNORMAL HIGH (ref 80.0–100.0)
Monocytes Absolute: 0.5 10*3/uL (ref 0.1–1.0)
Monocytes Relative: 5 %
Neutro Abs: 6.7 10*3/uL (ref 1.7–7.7)
Neutrophils Relative %: 76 %
Platelets: 195 10*3/uL (ref 150–400)
RBC: 3.44 MIL/uL — ABNORMAL LOW (ref 3.87–5.11)
RDW: 13.8 % (ref 11.5–15.5)
WBC: 8.8 10*3/uL (ref 4.0–10.5)
nRBC: 0 % (ref 0.0–0.2)

## 2021-05-23 LAB — COMPREHENSIVE METABOLIC PANEL
ALT: 16 U/L (ref 0–44)
AST: 16 U/L (ref 15–41)
Albumin: 3.3 g/dL — ABNORMAL LOW (ref 3.5–5.0)
Alkaline Phosphatase: 70 U/L (ref 38–126)
Anion gap: 7 (ref 5–15)
BUN: 34 mg/dL — ABNORMAL HIGH (ref 8–23)
CO2: 22 mmol/L (ref 22–32)
Calcium: 8.7 mg/dL — ABNORMAL LOW (ref 8.9–10.3)
Chloride: 113 mmol/L — ABNORMAL HIGH (ref 98–111)
Creatinine, Ser: 3.23 mg/dL — ABNORMAL HIGH (ref 0.44–1.00)
GFR, Estimated: 15 mL/min — ABNORMAL LOW (ref >60)
Glucose, Bld: 101 mg/dL — ABNORMAL HIGH (ref 70–99)
Potassium: 4.2 mmol/L (ref 3.5–5.1)
Sodium: 142 mmol/L (ref 135–145)
Total Bilirubin: 0.4 mg/dL (ref 0.3–1.2)
Total Protein: 6.6 g/dL (ref 6.5–8.1)

## 2021-05-23 LAB — IRON AND TIBC
Iron: 65 ug/dL (ref 28–170)
Saturation Ratios: 27 % (ref 10.4–31.8)
TIBC: 242 ug/dL — ABNORMAL LOW (ref 250–450)
UIBC: 177 ug/dL

## 2021-05-23 LAB — FERRITIN: Ferritin: 277 ng/mL (ref 11–307)

## 2021-05-23 LAB — VITAMIN B12: Vitamin B-12: 445 pg/mL (ref 180–914)

## 2021-05-29 NOTE — Progress Notes (Addendum)
Shannon [name], [place] [address]   CLINIC:  Medical Oncology/Hematology  PCP:  [name], MD [place] [address] [phone]   REASON FOR VISIT:  Follow-up for anemia of CKD stage IV/V  PRIOR THERAPY: Retacrit  CURRENT THERAPY: Observation  INTERVAL HISTORY:  Shannon [name] 66 y.o. female returns for routine follow-up of her anemia of CKD stage IV/V.  She was last seen by Dr. Alvy Bimler on 11/02/2020.  At today's visit, she reports feeling well.  No recent hospitalizations, surgeries, or changes in baseline health status.  She has not noticed any bleeding such as epistaxis, hematemesis, hematochezia, or melena.  She denies any symptoms of fatigue, pica, restless legs, headaches.  No chest pain, dyspnea on exertion, lightheadedness, or syncope.  Regarding her history of endometrial cancer, she denies any vaginal bleeding, abdominal pain, or B symptoms at this time.  She has 100% energy and 100% appetite. She endorses that she is maintaining a stable weight.   REVIEW OF SYSTEMS:  Review of Systems  Constitutional:  Negative for appetite change, chills, diaphoresis, fatigue, fever and unexpected weight change.  HENT:   Negative for lump/mass and nosebleeds.   Eyes:  Negative for eye problems.  Respiratory:  Negative for cough, hemoptysis and shortness of breath.   Cardiovascular:  Negative for chest pain, leg swelling and palpitations.  Gastrointestinal:  Negative for abdominal pain, blood in stool, constipation, diarrhea, nausea and vomiting.  Genitourinary:  Negative for hematuria.   Skin: Negative.   Neurological:  Negative for dizziness, headaches and light-headedness.  Hematological:  Does not bruise/bleed easily.     PAST MEDICAL/SURGICAL HISTORY:  Past Medical History:  Diagnosis Date   Absolute anemia 11/10/2015   Anemia    Anemia of renal disease 11/10/2015   Asthma    has Albuterol inhaler prn   CHF (congestive heart  failure) ([place])    acute systolic CHF 08/[facility])   Chronic kidney disease    Membranous nephropathy ( Seen at [facility])   Endometrial adenocarcinoma ([place])    Gastric ulcer    History of blood transfusion    no abnormal reaction noted   History of bronchitis    last time 3-48yrs ago   Hyperlipidemia    takes Pravastatin daily   Hypertension    takes Imdur and Hydralazine daily as well as Coreg   Hypothyroidism    takes Synthroid daily   Itching    on legs-has a Kenalog cream   Metabolic bone disease    Nausea    takes Zofran prn   Nonischemic cardiomyopathy ([place])    12/2011 normal coronaries, EF 22% (EF normal 04/2013)   Obesity    Peripheral edema    takes Torsemide daily   Thyroid disease    hypothyroidism   Past Surgical History:  Procedure Laterality Date   AV FISTULA PLACEMENT Left 07/22/2012   Procedure: INSERTION OF ARTERIOVENOUS GORE-TEX GRAFT ARM;  Surgeon: Angelia Mould, MD;  Location: [place];  Service: Vascular;  Laterality: Left;   AV FISTULA PLACEMENT Right 05/24/2017   Procedure: INSERTION OF ARTERIOVENOUS (AV) GORE-TEX GRAFT RIGHT ARM;  Surgeon: Angelia Mould, MD;  [facility];  Service: Vascular;  Laterality: Right;   CESAREAN SECTION     30+yrs ago   COLONOSCOPY     DILATION AND CURETTAGE, DIAGNOSTIC / THERAPEUTIC     left arm surgery with pin     15+yrs ago   ROBOTIC  ASSISTED TOTAL HYSTERECTOMY WITH BILATERAL SALPINGO OOPHERECTOMY Bilateral 09/24/2019   Procedure: XI ROBOTIC ASSISTED TOTAL HYSTERECTOMY WITH BILATERAL SALPINGO OOPHORECTOMY;  Surgeon: [name], MD;  [facility];  Service: Gynecology;  Laterality: Bilateral;   SENTINEL NODE BIOPSY N/A 09/24/2019   Procedure: SENTINEL NODE BIOPSY;  Surgeon: [name], MD;  [facility];  Service: Gynecology;  Laterality: N/A;   TUBAL LIGATION       SOCIAL HISTORY:  Social History   Socioeconomic History   Marital status: Single    Spouse name: Not on file    Number of children: Not on file   Years of education: Not on file   Highest education level: Not on file  Occupational History   Not on file  Tobacco Use   Smoking status: Every Day    Packs/day: 1.00    Years: 40.00    Pack years: 40.00    Types: Cigarettes    Start date: 05/14/1968   Smokeless tobacco: Never  Vaping Use   Vaping Use: Never used  Substance and Sexual Activity   Alcohol use: No   Drug use: No   Sexual activity: Not Currently  Other Topics Concern   Not on file  Social History Narrative   Not on file   Social Determinants of Health   Financial Resource Strain: Not on file  Food Insecurity: Not on file  Transportation Needs: Not on file  Physical Activity: Not on file  Stress: Not on file  Social Connections: Not on file  Intimate Partner Violence: Not on file    FAMILY HISTORY:  Family History  Problem Relation Age of Onset   Kidney disease Other    Diabetes Mother    Breast cancer Neg Hx    Colon cancer Neg Hx    Uterine cancer Neg Hx    Ovarian cancer Neg Hx     CURRENT MEDICATIONS:  Outpatient Encounter Medications as of 05/30/2021  Medication Sig   acetaminophen (TYLENOL) 500 MG tablet Take 1,000 mg by mouth daily as needed for moderate pain or headache.   albuterol (PROVENTIL HFA;VENTOLIN HFA) 108 (90 BASE) MCG/ACT inhaler Inhale 1-2 puffs into the lungs every 6 (six) hours as needed for wheezing.    augmented betamethasone dipropionate (DIPROLENE-AF) 0.05 % cream Apply 1 application topically 2 (two) times daily as needed (skin irritation.).    carvedilol (COREG) 12.5 MG tablet Take 12.5 mg by mouth 2 (two) times daily.   clobetasol cream (TEMOVATE) 3.21 % Apply 1 application topically daily as needed (rash).    hydrALAZINE (APRESOLINE) 50 MG tablet Take 50 mg by mouth 3 (three) times daily.    isosorbide mononitrate (IMDUR) 30 MG 24 hr tablet Take 30 mg by mouth daily.   metolazone (ZAROXOLYN) 5 MG tablet Take 5 mg by mouth 2 (two) times  daily as needed (swelling).    pravastatin (PRAVACHOL) 80 MG tablet Take 80 mg by mouth daily.   raloxifene (EVISTA) 60 MG tablet Take 60 mg by mouth daily.   RAYALDEE 30 MCG CPCR Take 30 mcg by mouth daily.    sodium bicarbonate 650 MG tablet Take 1,300 mg by mouth 3 (three) times daily.    torsemide (DEMADEX) 20 MG tablet Take 20 mg by mouth daily as needed (swelling).   No facility-administered encounter medications on file as of 05/30/2021.    ALLERGIES:  Allergies  Allergen Reactions   Corticosteroids Hives, Itching, Nausea And Vomiting and Rash    Tolerated dexamethasone when  being treated for a reaction to rituximab   Other Hives, Itching, Nausea And Vomiting and Rash    Tolerated dexamethasone when being treated for a reaction to rituximab Tolerated dexamethasone when being treated for a reaction to rituximab  Tolerated dexamethasone when being treated for a reaction to rituximab   Rituximab Shortness Of Breath    wheezing wheezing      PHYSICAL EXAM:  ECOG PERFORMANCE STATUS: 0 - Asymptomatic  There were no vitals filed for this visit. There were no vitals filed for this visit. Physical Exam Constitutional:      Appearance: Normal appearance. She is obese.  HENT:     Head: Normocephalic and atraumatic.     Mouth/Throat:     Mouth: Mucous membranes are moist.  Eyes:     Extraocular Movements: Extraocular movements intact.     Pupils: Pupils are equal, round, and reactive to light.  Cardiovascular:     Rate and Rhythm: Normal rate and regular rhythm.     Pulses: Normal pulses.     Heart sounds: Normal heart sounds.  Pulmonary:     Effort: Pulmonary effort is normal.     Breath sounds: Normal breath sounds.  Abdominal:     General: Bowel sounds are normal.     Palpations: Abdomen is soft.     Tenderness: There is no abdominal tenderness.  Musculoskeletal:        General: No swelling.     Right lower leg: No edema.     Left lower leg: No edema.   Lymphadenopathy:     Cervical: No cervical adenopathy.  Skin:    General: Skin is warm and dry.  Neurological:     General: No focal deficit present.     Mental Status: She is alert and oriented to person, place, and time.  Psychiatric:        Mood and Affect: Mood normal.        Behavior: Behavior normal.     LABORATORY DATA:  I have reviewed the labs as listed.  CBC    Component Value Date/Time   WBC 8.8 05/23/2021 1044   RBC 3.44 (L) 05/23/2021 1044   HGB 11.2 (L) 05/23/2021 1044   HCT 35.9 (L) 05/23/2021 1044   PLT 195 05/23/2021 1044   MCV 104.4 (H) 05/23/2021 1044   MCH 32.6 05/23/2021 1044   MCHC 31.2 05/23/2021 1044   RDW 13.8 05/23/2021 1044   LYMPHSABS 1.3 05/23/2021 1044   MONOABS 0.5 05/23/2021 1044   EOSABS 0.2 05/23/2021 1044   BASOSABS 0.1 05/23/2021 1044   CMP Latest Ref Rng & Units 05/23/2021 04/13/2021 02/06/2021  Glucose 70 - 99 mg/dL 101(H) 93 104(H)  BUN 8 - 23 mg/dL 34(H) 30(H) 38(H)  Creatinine 0.44 - 1.00 mg/dL 3.23(H) 3.20(H) 3.44(H)  Sodium 135 - 145 mmol/L 142 141 139  Potassium 3.5 - 5.1 mmol/L 4.2 4.4 3.8  Chloride 98 - 111 mmol/L 113(H) 111 109  CO2 22 - 32 mmol/L 22 24 23   Calcium 8.9 - 10.3 mg/dL 8.7(L) 8.9 8.8(L)  Total Protein 6.5 - 8.1 g/dL 6.6 - -  Total Bilirubin 0.3 - 1.2 mg/dL 0.4 - -  Alkaline Phos 38 - 126 U/L 70 - -  AST 15 - 41 U/L 16 - -  ALT 0 - 44 U/L 16 - -    DIAGNOSTIC IMAGING:  I have independently reviewed the relevant imaging and discussed with the patient.  ASSESSMENT & PLAN: 1.  Anemia due to chronic  kidney disease: - Normocytic anemia secondary to chronic kidney disease and functional iron deficiency - She was previously on Aranesp.  Current treatment plan is for Retacrit 40,000 units monthly, but she has not required any Retacrit since 10/16/2019. - She has not required any IV iron - Patient had a negative SPEP. - Last colonoscopy was reportedly done in [place], around 7 years ago. - It is recorded that she  has a history of AVMs and esophageal varices. - No bright red blood per rectum or melena - Most recent labs (05/23/2021): Hgb 11.2/MCV 104.4, ferritin 277, iron saturation 27%.  - PLAN: She does not require ESA or IV iron at this time. - Repeat labs and RTC in 6 months.   2.  Vitamin B12 deficiency: - She was placed on sublingual vitamin B12. -Most recent vitamin B12 (05/23/2021): Normal at 445 - PLAN: Continue daily B12.  3.  Endometrial adenocarcinoma, stage Ia, grade 1 (diagnosed May 2021) - Early stage endometrial cancer diagnosed in April 2021, s/p surgical resection (09/24/2019) - She follows with GYN oncologist (Dr. Jeral Pinch), last seen on 10/24/2020 - She denies any current symptoms of vaginal bleeding, abdominal pain, or B symptoms - PLAN: Continue annual follow-up visits per GYN oncology  4.   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 - Most recent CMP (05/23/2021): Creatinine 3.23/GFR 15, at baseline - PLAN: Continue follow-up with Dr. Theador Hawthorne   PLAN SUMMARY & DISPOSITION: Labs in 6 months Office visit after labs  All questions were answered. The patient knows to call the clinic with any problems, questions or concerns.  Medical decision making: Low  Time spent on visit: I spent 20 minutes counseling the patient face to face. The total time spent in the appointment was 30 minutes and more than 50% was on counseling.   Harriett Rush, PA-C  05/30/2021 9:22 AM

## 2021-05-30 ENCOUNTER — Other Ambulatory Visit: Payer: Self-pay

## 2021-05-30 ENCOUNTER — Ambulatory Visit (HOSPITAL_COMMUNITY): Payer: Medicare Other | Admitting: Hematology

## 2021-05-30 ENCOUNTER — Inpatient Hospital Stay (HOSPITAL_COMMUNITY): Payer: Commercial Managed Care - HMO | Admitting: Physician Assistant

## 2021-05-30 VITALS — BP 137/62 | HR 84 | Temp 96.9°F | Resp 20 | Wt 225.9 lb

## 2021-05-30 DIAGNOSIS — N184 Chronic kidney disease, stage 4 (severe): Secondary | ICD-10-CM | POA: Diagnosis not present

## 2021-05-30 DIAGNOSIS — E039 Hypothyroidism, unspecified: Secondary | ICD-10-CM | POA: Diagnosis not present

## 2021-05-30 DIAGNOSIS — C541 Malignant neoplasm of endometrium: Secondary | ICD-10-CM | POA: Diagnosis not present

## 2021-05-30 DIAGNOSIS — E538 Deficiency of other specified B group vitamins: Secondary | ICD-10-CM

## 2021-05-30 DIAGNOSIS — N189 Chronic kidney disease, unspecified: Secondary | ICD-10-CM | POA: Diagnosis not present

## 2021-05-30 DIAGNOSIS — I130 Hypertensive heart and chronic kidney disease with heart failure and stage 1 through stage 4 chronic kidney disease, or unspecified chronic kidney disease: Secondary | ICD-10-CM | POA: Diagnosis not present

## 2021-05-30 DIAGNOSIS — D508 Other iron deficiency anemias: Secondary | ICD-10-CM | POA: Diagnosis not present

## 2021-05-30 DIAGNOSIS — D631 Anemia in chronic kidney disease: Secondary | ICD-10-CM

## 2021-05-30 NOTE — Patient Instructions (Signed)
[facility] Discharge Instructions  You were seen today by Tarri Abernethy PA-C for your anemia related to your chronic kidney disease.  Your blood and iron levels looked great!  You do not need any shots for your blood at this time.    LABS: Return in 6 months for repeat labs   OTHER TESTS: None  MEDICATIONS: Continue B12 supplement  FOLLOW-UP APPOINTMENT: Office visit in 6 months, after labs   Thank you for choosing [facility] at [facility] to provide your oncology and hematology care.  To afford each patient quality time with our provider, please arrive at least 15 minutes before your scheduled appointment time.   If you have a lab appointment with the [facility] please come in thru the Main Entrance and check in at the main information desk.  You need to re-schedule your appointment should you arrive 10 or more minutes late.  We strive to give you quality time with our providers, and arriving late affects you and other patients whose appointments are after yours.  Also, if you no show three or more times for appointments you may be dismissed from the clinic at the providers discretion.     Again, thank you for choosing [facility].  Our hope is that these requests will decrease the amount of time that you wait before being seen by our physicians.       _____________________________________________________________  Should you have questions after your visit to [facility], please contact our office at [phone] and follow the prompts.  Our office hours are 8:00 a.m. and 4:30 p.m. Monday - Friday.  Please note that voicemails left after 4:00 p.m. may not be returned until the following business day.  We are closed weekends and major holidays.  You do have access to a nurse 24-7, just call the main number to the clinic [phone] and do not press any options, hold on the line and a nurse will  answer the phone.    For prescription refill requests, have your pharmacy contact our office and allow 72 hours.    Due to Covid, you will need to wear a mask upon entering the hospital. If you do not have a mask, a mask will be given to you at the Main Entrance upon arrival. For doctor visits, patients may have 1 support person age 40 or older with them. For treatment visits, patients can not have anyone with them due to social distancing guidelines and our immunocompromised population.

## 2021-06-08 ENCOUNTER — Other Ambulatory Visit: Payer: Self-pay

## 2021-06-08 ENCOUNTER — Other Ambulatory Visit (HOSPITAL_COMMUNITY)
Admission: RE | Admit: 2021-06-08 | Discharge: 2021-06-08 | Disposition: A | Discharge status: Home / Self Care | Payer: Commercial Managed Care - HMO | Source: Ambulatory Visit | Attending: Nephrology | Admitting: Nephrology

## 2021-06-08 DIAGNOSIS — N032 Chronic nephritic syndrome with diffuse membranous glomerulonephritis: Secondary | ICD-10-CM | POA: Insufficient documentation

## 2021-06-08 DIAGNOSIS — I129 Hypertensive chronic kidney disease with stage 1 through stage 4 chronic kidney disease, or unspecified chronic kidney disease: Secondary | ICD-10-CM | POA: Insufficient documentation

## 2021-06-08 DIAGNOSIS — N184 Chronic kidney disease, stage 4 (severe): Secondary | ICD-10-CM | POA: Diagnosis not present

## 2021-06-08 DIAGNOSIS — Z6839 Body mass index (BMI) 39.0-39.9, adult: Secondary | ICD-10-CM | POA: Diagnosis present

## 2021-06-08 DIAGNOSIS — D631 Anemia in chronic kidney disease: Secondary | ICD-10-CM | POA: Insufficient documentation

## 2021-06-08 DIAGNOSIS — R809 Proteinuria, unspecified: Secondary | ICD-10-CM | POA: Diagnosis not present

## 2021-06-08 DIAGNOSIS — N189 Chronic kidney disease, unspecified: Secondary | ICD-10-CM | POA: Insufficient documentation

## 2021-06-08 LAB — RENAL FUNCTION PANEL
Albumin: 3.4 g/dL — ABNORMAL LOW (ref 3.5–5.0)
Anion gap: 8 (ref 5–15)
BUN: 30 mg/dL — ABNORMAL HIGH (ref 8–23)
CO2: 23 mmol/L (ref 22–32)
Calcium: 9 mg/dL (ref 8.9–10.3)
Chloride: 110 mmol/L (ref 98–111)
Creatinine, Ser: 3.33 mg/dL — ABNORMAL HIGH (ref 0.44–1.00)
GFR, Estimated: 15 mL/min — ABNORMAL LOW (ref >60)
Glucose, Bld: 121 mg/dL — ABNORMAL HIGH (ref 70–99)
Phosphorus: 3.7 mg/dL (ref 2.5–4.6)
Potassium: 4.1 mmol/L (ref 3.5–5.1)
Sodium: 141 mmol/L (ref 135–145)

## 2021-06-08 LAB — PROTEIN / CREATININE RATIO, URINE
Creatinine, Urine: 107.74 mg/dL
Protein Creatinine Ratio: 0.8 mg/mg{Cre} — ABNORMAL HIGH (ref 0.00–0.15)
Total Protein, Urine: 86 mg/dL

## 2021-06-08 LAB — CBC
HCT: 36.7 % (ref 36.0–46.0)
Hemoglobin: 11.5 g/dL — ABNORMAL LOW (ref 12.0–15.0)
MCH: 32.5 pg (ref 26.0–34.0)
MCHC: 31.3 g/dL (ref 30.0–36.0)
MCV: 103.7 fL — ABNORMAL HIGH (ref 80.0–100.0)
Platelets: 253 10*3/uL (ref 150–400)
RBC: 3.54 MIL/uL — ABNORMAL LOW (ref 3.87–5.11)
RDW: 13.1 % (ref 11.5–15.5)
WBC: 8.7 10*3/uL (ref 4.0–10.5)
nRBC: 0 % (ref 0.0–0.2)

## 2021-06-12 DIAGNOSIS — I129 Hypertensive chronic kidney disease with stage 1 through stage 4 chronic kidney disease, or unspecified chronic kidney disease: Secondary | ICD-10-CM | POA: Diagnosis not present

## 2021-06-12 DIAGNOSIS — R809 Proteinuria, unspecified: Secondary | ICD-10-CM | POA: Diagnosis not present

## 2021-06-12 DIAGNOSIS — N032 Chronic nephritic syndrome with diffuse membranous glomerulonephritis: Secondary | ICD-10-CM | POA: Diagnosis not present

## 2021-06-12 DIAGNOSIS — N184 Chronic kidney disease, stage 4 (severe): Secondary | ICD-10-CM | POA: Diagnosis not present

## 2021-06-21 DIAGNOSIS — F17210 Nicotine dependence, cigarettes, uncomplicated: Secondary | ICD-10-CM | POA: Diagnosis not present

## 2021-06-21 DIAGNOSIS — I509 Heart failure, unspecified: Secondary | ICD-10-CM | POA: Diagnosis not present

## 2021-06-21 DIAGNOSIS — D582 Other hemoglobinopathies: Secondary | ICD-10-CM | POA: Diagnosis not present

## 2021-06-21 DIAGNOSIS — N184 Chronic kidney disease, stage 4 (severe): Secondary | ICD-10-CM | POA: Diagnosis not present

## 2021-06-21 DIAGNOSIS — N185 Chronic kidney disease, stage 5: Secondary | ICD-10-CM | POA: Diagnosis not present

## 2021-06-21 DIAGNOSIS — Z299 Encounter for prophylactic measures, unspecified: Secondary | ICD-10-CM | POA: Diagnosis not present

## 2021-06-21 DIAGNOSIS — I10 Essential (primary) hypertension: Secondary | ICD-10-CM | POA: Diagnosis not present

## 2021-07-11 DIAGNOSIS — J40 Bronchitis, not specified as acute or chronic: Secondary | ICD-10-CM | POA: Diagnosis not present

## 2021-07-11 DIAGNOSIS — E7800 Pure hypercholesterolemia, unspecified: Secondary | ICD-10-CM | POA: Diagnosis not present

## 2021-08-09 ENCOUNTER — Other Ambulatory Visit (HOSPITAL_COMMUNITY)
Admission: RE | Admit: 2021-08-09 | Discharge: 2021-08-09 | Disposition: A | Discharge status: Home / Self Care | Payer: Medicare Other | Source: Ambulatory Visit | Attending: Nephrology | Admitting: Nephrology

## 2021-08-09 ENCOUNTER — Encounter (HOSPITAL_COMMUNITY): Payer: Self-pay | Admitting: Hematology

## 2021-08-09 DIAGNOSIS — I129 Hypertensive chronic kidney disease with stage 1 through stage 4 chronic kidney disease, or unspecified chronic kidney disease: Secondary | ICD-10-CM | POA: Insufficient documentation

## 2021-08-09 DIAGNOSIS — N184 Chronic kidney disease, stage 4 (severe): Secondary | ICD-10-CM | POA: Diagnosis not present

## 2021-08-09 DIAGNOSIS — N189 Chronic kidney disease, unspecified: Secondary | ICD-10-CM | POA: Insufficient documentation

## 2021-08-09 DIAGNOSIS — Z6839 Body mass index (BMI) 39.0-39.9, adult: Secondary | ICD-10-CM | POA: Diagnosis present

## 2021-08-09 DIAGNOSIS — R809 Proteinuria, unspecified: Secondary | ICD-10-CM | POA: Diagnosis not present

## 2021-08-09 DIAGNOSIS — D631 Anemia in chronic kidney disease: Secondary | ICD-10-CM | POA: Insufficient documentation

## 2021-08-09 DIAGNOSIS — N032 Chronic nephritic syndrome with diffuse membranous glomerulonephritis: Secondary | ICD-10-CM | POA: Insufficient documentation

## 2021-08-09 LAB — RENAL FUNCTION PANEL
Albumin: 3.3 g/dL — ABNORMAL LOW (ref 3.5–5.0)
Anion gap: 5 (ref 5–15)
BUN: 33 mg/dL — ABNORMAL HIGH (ref 8–23)
CO2: 22 mmol/L (ref 22–32)
Calcium: 8.5 mg/dL — ABNORMAL LOW (ref 8.9–10.3)
Chloride: 112 mmol/L — ABNORMAL HIGH (ref 98–111)
Creatinine, Ser: 3.78 mg/dL — ABNORMAL HIGH (ref 0.44–1.00)
GFR, Estimated: 13 mL/min — ABNORMAL LOW (ref >60)
Glucose, Bld: 114 mg/dL — ABNORMAL HIGH (ref 70–99)
Phosphorus: 4.1 mg/dL (ref 2.5–4.6)
Potassium: 3.7 mmol/L (ref 3.5–5.1)
Sodium: 139 mmol/L (ref 135–145)

## 2021-08-09 LAB — CBC
HCT: 33.9 % — ABNORMAL LOW (ref 36.0–46.0)
Hemoglobin: 10.4 g/dL — ABNORMAL LOW (ref 12.0–15.0)
MCH: 30.6 pg (ref 26.0–34.0)
MCHC: 30.7 g/dL (ref 30.0–36.0)
MCV: 99.7 fL (ref 80.0–100.0)
Platelets: 233 10*3/uL (ref 150–400)
RBC: 3.4 MIL/uL — ABNORMAL LOW (ref 3.87–5.11)
RDW: 13.1 % (ref 11.5–15.5)
WBC: 8.1 10*3/uL (ref 4.0–10.5)
nRBC: 0 % (ref 0.0–0.2)

## 2021-08-09 LAB — PROTEIN / CREATININE RATIO, URINE
Creatinine, Urine: 106.21 mg/dL
Protein Creatinine Ratio: 0.72 mg/mg{Cre} — ABNORMAL HIGH (ref 0.00–0.15)
Total Protein, Urine: 76 mg/dL

## 2021-08-16 DIAGNOSIS — N032 Chronic nephritic syndrome with diffuse membranous glomerulonephritis: Secondary | ICD-10-CM | POA: Diagnosis not present

## 2021-08-16 DIAGNOSIS — N185 Chronic kidney disease, stage 5: Secondary | ICD-10-CM | POA: Diagnosis not present

## 2021-08-16 DIAGNOSIS — N170 Acute kidney failure with tubular necrosis: Secondary | ICD-10-CM | POA: Diagnosis not present

## 2021-08-16 DIAGNOSIS — I129 Hypertensive chronic kidney disease with stage 1 through stage 4 chronic kidney disease, or unspecified chronic kidney disease: Secondary | ICD-10-CM | POA: Diagnosis not present

## 2021-08-16 DIAGNOSIS — D631 Anemia in chronic kidney disease: Secondary | ICD-10-CM | POA: Diagnosis not present

## 2021-08-16 DIAGNOSIS — N189 Chronic kidney disease, unspecified: Secondary | ICD-10-CM | POA: Diagnosis not present

## 2021-08-28 DIAGNOSIS — Z299 Encounter for prophylactic measures, unspecified: Secondary | ICD-10-CM | POA: Diagnosis not present

## 2021-08-28 DIAGNOSIS — I25119 Atherosclerotic heart disease of native coronary artery with unspecified angina pectoris: Secondary | ICD-10-CM | POA: Diagnosis not present

## 2021-08-28 DIAGNOSIS — I10 Essential (primary) hypertension: Secondary | ICD-10-CM | POA: Diagnosis not present

## 2021-08-28 DIAGNOSIS — I509 Heart failure, unspecified: Secondary | ICD-10-CM | POA: Diagnosis not present

## 2021-08-28 DIAGNOSIS — M5430 Sciatica, unspecified side: Secondary | ICD-10-CM | POA: Diagnosis not present

## 2021-08-30 DIAGNOSIS — F17210 Nicotine dependence, cigarettes, uncomplicated: Secondary | ICD-10-CM | POA: Diagnosis not present

## 2021-08-30 DIAGNOSIS — L0390 Cellulitis, unspecified: Secondary | ICD-10-CM | POA: Diagnosis not present

## 2021-08-30 DIAGNOSIS — Z299 Encounter for prophylactic measures, unspecified: Secondary | ICD-10-CM | POA: Diagnosis not present

## 2021-08-30 DIAGNOSIS — I10 Essential (primary) hypertension: Secondary | ICD-10-CM | POA: Diagnosis not present

## 2021-09-10 DIAGNOSIS — E7800 Pure hypercholesterolemia, unspecified: Secondary | ICD-10-CM | POA: Diagnosis not present

## 2021-09-10 DIAGNOSIS — M810 Age-related osteoporosis without current pathological fracture: Secondary | ICD-10-CM | POA: Diagnosis not present

## 2021-09-10 DIAGNOSIS — I509 Heart failure, unspecified: Secondary | ICD-10-CM | POA: Diagnosis not present

## 2021-09-10 DIAGNOSIS — I129 Hypertensive chronic kidney disease with stage 1 through stage 4 chronic kidney disease, or unspecified chronic kidney disease: Secondary | ICD-10-CM | POA: Diagnosis not present

## 2021-09-11 DIAGNOSIS — M79604 Pain in right leg: Secondary | ICD-10-CM | POA: Diagnosis not present

## 2021-09-11 DIAGNOSIS — F17210 Nicotine dependence, cigarettes, uncomplicated: Secondary | ICD-10-CM | POA: Diagnosis not present

## 2021-09-11 DIAGNOSIS — I10 Essential (primary) hypertension: Secondary | ICD-10-CM | POA: Diagnosis not present

## 2021-09-11 DIAGNOSIS — Z299 Encounter for prophylactic measures, unspecified: Secondary | ICD-10-CM | POA: Diagnosis not present

## 2021-09-12 ENCOUNTER — Other Ambulatory Visit (HOSPITAL_COMMUNITY)
Admission: RE | Admit: 2021-09-12 | Discharge: 2021-09-12 | Disposition: A | Discharge status: Home / Self Care | Payer: Medicare Other | Source: Ambulatory Visit | Attending: Nephrology | Admitting: Nephrology

## 2021-09-12 DIAGNOSIS — N170 Acute kidney failure with tubular necrosis: Secondary | ICD-10-CM | POA: Diagnosis not present

## 2021-09-12 DIAGNOSIS — I129 Hypertensive chronic kidney disease with stage 1 through stage 4 chronic kidney disease, or unspecified chronic kidney disease: Secondary | ICD-10-CM | POA: Diagnosis not present

## 2021-09-12 DIAGNOSIS — D631 Anemia in chronic kidney disease: Secondary | ICD-10-CM | POA: Diagnosis not present

## 2021-09-12 DIAGNOSIS — N189 Chronic kidney disease, unspecified: Secondary | ICD-10-CM | POA: Diagnosis not present

## 2021-09-12 DIAGNOSIS — Z6841 Body Mass Index (BMI) 40.0 and over, adult: Secondary | ICD-10-CM | POA: Insufficient documentation

## 2021-09-12 DIAGNOSIS — N032 Chronic nephritic syndrome with diffuse membranous glomerulonephritis: Secondary | ICD-10-CM | POA: Diagnosis not present

## 2021-09-12 DIAGNOSIS — N185 Chronic kidney disease, stage 5: Secondary | ICD-10-CM | POA: Diagnosis not present

## 2021-09-12 LAB — RENAL FUNCTION PANEL
Albumin: 3.4 g/dL — ABNORMAL LOW (ref 3.5–5.0)
Anion gap: 8 (ref 5–15)
BUN: 28 mg/dL — ABNORMAL HIGH (ref 8–23)
CO2: 21 mmol/L — ABNORMAL LOW (ref 22–32)
Calcium: 8.9 mg/dL (ref 8.9–10.3)
Chloride: 111 mmol/L (ref 98–111)
Creatinine, Ser: 3.74 mg/dL — ABNORMAL HIGH (ref 0.44–1.00)
GFR, Estimated: 13 mL/min — ABNORMAL LOW (ref >60)
Glucose, Bld: 125 mg/dL — ABNORMAL HIGH (ref 70–99)
Phosphorus: 3.7 mg/dL (ref 2.5–4.6)
Potassium: 3.8 mmol/L (ref 3.5–5.1)
Sodium: 140 mmol/L (ref 135–145)

## 2021-09-12 LAB — CBC
HCT: 34.5 % — ABNORMAL LOW (ref 36.0–46.0)
Hemoglobin: 10.5 g/dL — ABNORMAL LOW (ref 12.0–15.0)
MCH: 30.4 pg (ref 26.0–34.0)
MCHC: 30.4 g/dL (ref 30.0–36.0)
MCV: 100 fL (ref 80.0–100.0)
Platelets: 224 10*3/uL (ref 150–400)
RBC: 3.45 MIL/uL — ABNORMAL LOW (ref 3.87–5.11)
RDW: 13.9 % (ref 11.5–15.5)
WBC: 8.8 10*3/uL (ref 4.0–10.5)
nRBC: 0 % (ref 0.0–0.2)

## 2021-09-12 LAB — PROTEIN / CREATININE RATIO, URINE
Creatinine, Urine: 116.65 mg/dL
Protein Creatinine Ratio: 0.8 mg/mg{Cre} — ABNORMAL HIGH (ref 0.00–0.15)
Total Protein, Urine: 93 mg/dL

## 2021-09-14 LAB — PTH, INTACT AND CALCIUM
Calcium, Total (PTH): 9.1 mg/dL (ref 8.7–10.3)
PTH: 47 pg/mL (ref 15–65)

## 2021-09-19 DIAGNOSIS — D631 Anemia in chronic kidney disease: Secondary | ICD-10-CM | POA: Diagnosis not present

## 2021-09-19 DIAGNOSIS — R809 Proteinuria, unspecified: Secondary | ICD-10-CM | POA: Diagnosis not present

## 2021-09-19 DIAGNOSIS — N032 Chronic nephritic syndrome with diffuse membranous glomerulonephritis: Secondary | ICD-10-CM | POA: Diagnosis not present

## 2021-09-19 DIAGNOSIS — I129 Hypertensive chronic kidney disease with stage 1 through stage 4 chronic kidney disease, or unspecified chronic kidney disease: Secondary | ICD-10-CM | POA: Diagnosis not present

## 2021-09-19 DIAGNOSIS — N185 Chronic kidney disease, stage 5: Secondary | ICD-10-CM | POA: Diagnosis not present

## 2021-09-19 DIAGNOSIS — N189 Chronic kidney disease, unspecified: Secondary | ICD-10-CM | POA: Diagnosis not present

## 2021-09-19 DIAGNOSIS — E211 Secondary hyperparathyroidism, not elsewhere classified: Secondary | ICD-10-CM | POA: Diagnosis not present

## 2021-09-19 DIAGNOSIS — E8722 Chronic metabolic acidosis: Secondary | ICD-10-CM | POA: Diagnosis not present

## 2021-10-03 ENCOUNTER — Encounter: Payer: Self-pay | Admitting: Gynecologic Oncology

## 2021-10-05 DIAGNOSIS — F17210 Nicotine dependence, cigarettes, uncomplicated: Secondary | ICD-10-CM | POA: Diagnosis not present

## 2021-10-05 DIAGNOSIS — I10 Essential (primary) hypertension: Secondary | ICD-10-CM | POA: Diagnosis not present

## 2021-10-05 DIAGNOSIS — M5431 Sciatica, right side: Secondary | ICD-10-CM | POA: Diagnosis not present

## 2021-10-05 DIAGNOSIS — L97911 Non-pressure chronic ulcer of unspecified part of right lower leg limited to breakdown of skin: Secondary | ICD-10-CM | POA: Diagnosis not present

## 2021-10-05 DIAGNOSIS — Z299 Encounter for prophylactic measures, unspecified: Secondary | ICD-10-CM | POA: Diagnosis not present

## 2021-10-05 DIAGNOSIS — M5416 Radiculopathy, lumbar region: Secondary | ICD-10-CM | POA: Diagnosis not present

## 2021-10-12 ENCOUNTER — Other Ambulatory Visit: Payer: Self-pay

## 2021-10-12 ENCOUNTER — Encounter: Payer: Self-pay | Admitting: Gynecologic Oncology

## 2021-10-12 ENCOUNTER — Inpatient Hospital Stay: Payer: Medicare Other | Attending: Gynecologic Oncology | Admitting: Gynecologic Oncology

## 2021-10-12 VITALS — BP 147/60 | HR 64 | Temp 98.0°F | Resp 18 | Ht 63.0 in | Wt 228.4 lb

## 2021-10-12 DIAGNOSIS — Z90722 Acquired absence of ovaries, bilateral: Secondary | ICD-10-CM | POA: Diagnosis not present

## 2021-10-12 DIAGNOSIS — Z8542 Personal history of malignant neoplasm of other parts of uterus: Secondary | ICD-10-CM | POA: Insufficient documentation

## 2021-10-12 DIAGNOSIS — C541 Malignant neoplasm of endometrium: Secondary | ICD-10-CM

## 2021-10-12 DIAGNOSIS — Z90710 Acquired absence of both cervix and uterus: Secondary | ICD-10-CM | POA: Insufficient documentation

## 2021-10-12 NOTE — Progress Notes (Signed)
Gynecologic Oncology Return Clinic Visit  10/12/21  Reason for Visit: Surveillance visit in the setting of endometrial cancer  Treatment History: Oncology History Overview Note  MSI-stable   Endometrial/uterine adenocarcinoma ([place])  08/13/2019 Initial Biopsy   CKC and D&C - CKC negativ, EMB showed FIGO gr1 EMCA   08/13/2019 Initial Diagnosis   Endometrial/uterine adenocarcinoma ([place])   09/24/2019 Surgery   TRH/BSO, SLN   09/24/2019 Pathology Results   IA, grade 1, endometrioid adenoca; DOI 1/62m, no LVSI, SLNs negative   09/24/2019 Cancer Staging   Staging form: Corpus Uteri - Carcinoma and Carcinosarcoma, AJCC 8th Edition - Clinical stage from 09/24/2019: FIGO Stage IA (cT1a, cN0(sn), cM0) - Signed by [name] MD on 09/29/2019      Interval History: Patient reports doing very well since her last visit with me.  She denies any vaginal bleeding or discharge.  She denies any pelvic or abdominal pain.  Reports regular bowel and bladder function.  Past Medical/Surgical History: Past Medical History:  Diagnosis Date   Absolute anemia 11/10/2015   Anemia    Anemia of renal disease 11/10/2015   Asthma    has Albuterol inhaler prn   [place]    [facility]   Chronic kidney disease    [facility]   [place]ometrial adenocarcinoma ([place]    Gastric ulcer    History of blood transfusion    no abnormal reaction noted   History of bronchitis    last time 3-[place]   Hyperlipidemia    takes Pravastatin daily   Hypertension    takes Imdur and Hydralazine daily as well as Coreg   Hypothyroidism    takes Synthroid daily   Itching    on legs-has a Kenalog cream   Metabolic bone disease    Nausea    takes Zofran prn   [place]ischemic cardiomyopathy ([place]   12/2011 normal coronaries, EF 22% (EF normal 04/2013)   Obesity    Peripheral edema    takes Torsemide daily   Thyroid disease    hypothyroidism     Past Surgical History:  Procedure Laterality Date   AV FISTULA PLACEMENT Left 07/22/2012   Procedure: INSERTION OF ARTERIOVENOUS GORE-TEX GRAFT ARM;  Surgeon: ChAngelia MouldMD;  [facility]: [facility];  Laterality: Left;   AV FISTULA PLACEMENT Right 05/24/2017   Procedure: INSERTION OF ARTERIOVENOUS (AV) GORE-TEX GRAFT RIGHT ARM;  Surgeon: DiAngelia MouldMD;  [facility] Service: [facility];  Laterality: Right;   CESAREAN SECTION     30+yrs ago   COLONOSCOPY     DILATION AND CURETTAGE, DIAGNOSTIC / THERAPEUTIC     left arm surgery with pin     15+yrs ago   ROBOTIC ASSISTED TOTAL HYSTERECTOMY WITH BILATERAL SALPINGO OOPHERECTOMY Bilateral 09/24/2019   Procedure: XI ROBOTIC ASLea Surgeon: TuLafonda MossesMD;  [facility];  Service: Gynecology;  Laterality: Bilateral;   SENTINEL NODE BIOPSY N/A 09/24/2019   Procedure: SENTINEL NODE BIOPSY;  Surgeon: TuLafonda MossesMD;  [facility];  Service: Gynecology;  Laterality: N/A;   TUBAL LIGATION      Family History  Problem Relation Age of Onset   Kidney disease Other    Diabetes Mother    Breast cancer Neg Hx    Colon cancer Neg Hx    Uterine cancer Neg Hx    Ovarian cancer  Neg Hx     Social History   Socioeconomic History   Marital status: Single    Spouse name: Not on file   Number of children: Not on file   Years of education: Not on file   Highest education level: Not on file  Occupational History   Not on file  Tobacco Use   Smoking status: Every Day    Packs/day: 1.00    Years: 40.00    Pack years: 40.00    Types: Cigarettes    Start date: 05/14/1968   Smokeless tobacco: Never  Vaping Use   Vaping Use: Never used  Substance and Sexual Activity   Alcohol use: No   Drug use: No   Sexual activity: Not Currently  Other Topics Concern   Not on file  Social History Narrative   Not on file   Social  Determinants of Health   Financial Resource Strain: Not on file  Food Insecurity: Not on file  Transportation Needs: Not on file  Physical Activity: Not on file  Stress: Not on file  Social Connections: Not on file    Current Medications:  Current Outpatient Medications:    acetaminophen (TYLENOL) 500 MG tablet, Take 1,000 mg by mouth daily as needed for moderate pain or headache., Disp: , Rfl:    albuterol (PROVENTIL HFA;VENTOLIN HFA) 108 (90 BASE) MCG/ACT inhaler, Inhale 1-2 puffs into the lungs every 6 (six) hours as needed for wheezing. , Disp: , Rfl:    augmented betamethasone dipropionate (DIPROLENE-AF) 0.05 % cream, Apply 1 application topically 2 (two) times daily as needed (skin irritation.). , Disp: , Rfl:    carvedilol (COREG) 12.5 MG tablet, Take 12.5 mg by mouth 2 (two) times daily., Disp: , Rfl:    clobetasol cream (TEMOVATE) 8.29 %, Apply 1 application topically daily as needed (rash). , Disp: , Rfl:    hydrALAZINE (APRESOLINE) 50 MG tablet, Take 50 mg by mouth 3 (three) times daily. , Disp: , Rfl:    isosorbide mononitrate (IMDUR) 30 MG 24 hr tablet, Take 30 mg by mouth daily., Disp: , Rfl:    metolazone (ZAROXOLYN) 5 MG tablet, Take 5 mg by mouth 2 (two) times daily as needed (swelling). , Disp: , Rfl:    pravastatin (PRAVACHOL) 80 MG tablet, Take 80 mg by mouth daily., Disp: , Rfl:    raloxifene (EVISTA) 60 MG tablet, Take 60 mg by mouth daily., Disp: , Rfl:    RAYALDEE 30 MCG CPCR, Take 30 mcg by mouth daily. , Disp: , Rfl:    sodium bicarbonate 650 MG tablet, Take 1,300 mg by mouth 3 (three) times daily. , Disp: , Rfl:    torsemide (DEMADEX) 20 MG tablet, Take 20 mg by mouth daily as needed (swelling)., Disp: , Rfl:    hydrocortisone 1 % ointment, SMARTSIG:Topical 1-3 Times Daily PRN, Disp: , Rfl:   Review of Systems: Denies appetite changes, fevers, chills, fatigue, unexplained weight changes. Denies hearing loss, neck lumps or masses, mouth sores, ringing in ears  or voice changes. Denies cough or wheezing.  Denies shortness of breath. Denies chest pain or palpitations. Denies leg swelling. Denies abdominal distention, pain, blood in stools, constipation, diarrhea, nausea, vomiting, or early satiety. Denies pain with intercourse, dysuria, frequency, hematuria or incontinence. Denies hot flashes, pelvic pain, vaginal bleeding or vaginal discharge.   Denies joint pain, back pain or muscle pain/cramps. Denies itching, rash, or wounds. Denies dizziness, headaches, numbness or seizures. Denies swollen lymph nodes or glands, denies easy  bruising or bleeding. Denies anxiety, depression, confusion, or decreased concentration.  Physical Exam: BP (!) 147/60 (BP Location: Left Arm, Patient Position: Sitting)   Pulse 64   Temp 98 F (36.7 C) (Oral)   Resp 18   Ht '5\' 3"'  (1.6 m)   Wt 228 lb 6.4 oz (103.6 kg)   SpO2 99%   BMI 40.46 kg/m  General: Alert, oriented, no acute distress. HEENT: Normocephalic, atraumatic, sclera anicteric. Chest: Unlabored breathing on room air. Abdomen: Obese, soft, nontender.  Normoactive bowel sounds.  No masses or hepatosplenomegaly appreciated.  Well-healed laparoscopic incisions. Extremities: Grossly normal range of motion.  Warm, well perfused.  Trace edema bilaterally. Lymphatics: No cervical, supraclavicular, or inguinal adenopathy. GU: Normal appearing external genitalia without erythema, excoriation, or lesions.  Speculum exam reveals mildly atrophic vaginal mucosa, no lesions or masses, no bleeding or discharge.  Bimanual exam reveals cuff intact, no masses or nodularity.  Rectovaginal exam confirms these findings.  Laboratory & Radiologic Studies: None new  Assessment & Plan: Shannon [name] is a 65 y.o. woman with Stage 1A, grade 1 low risk, early stage endometrioid adenocarcinoma who presents for surveillance. Definitive surgery 09/2019. MMR intact, MS stable.   The patient is doing well and is NED on exam  today.  The patient and I reviewed NCCN and SGO surveillance guidelines recommendations and the difference between the 2 with visits every 6 months versus every year after the 1 year mark from diagnosis.  The patient had opted to proceed with SGO surveillance recommendations and we transition to yearly visits at her last visit.  We discussed again that a recurrence would be most common at the vaginal cuff. We discussed the signs and symptoms that would be concerning for disease recurrence that should prompt a phone call prior to her next visit.  I will plan to see the patient yearly.  She will call after the new year to get a visit scheduled with me.  I have encouraged her to reach out to her OB/GYN for a visit at the end of the year.  She alternates visits between the 2 of [place], she will be reestablished with her OB/GYN when she is ready for discharge from our clinic.  26 minutes of total time was spent for this patient encounter, including preparation, face-to-face counseling with the patient and coordination of care, and documentation of the encounter.  Jeral Pinch, MD  Division of Gynecologic Oncology  Department of Obstetrics and Gynecology  [facility]

## 2021-10-12 NOTE — Patient Instructions (Addendum)
It was good to see you today.  I do not see or feel any evidence of cancer recurrence.  As we have previously discussed, we will continue with yearly visits.  Please call sometime after the new year to schedule a visit with me in May or June of next year.  As always, if you develop any new and concerning symptoms, such as vaginal bleeding or unintentional weight loss, please call to see me sooner.   Dr. Evie Lacks  is your gynecologist.  Please reach out to get a visit scheduled at the end of the year.

## 2021-10-31 DIAGNOSIS — Z299 Encounter for prophylactic measures, unspecified: Secondary | ICD-10-CM | POA: Diagnosis not present

## 2021-10-31 DIAGNOSIS — I509 Heart failure, unspecified: Secondary | ICD-10-CM | POA: Diagnosis not present

## 2021-10-31 DIAGNOSIS — M5416 Radiculopathy, lumbar region: Secondary | ICD-10-CM | POA: Diagnosis not present

## 2021-10-31 DIAGNOSIS — N185 Chronic kidney disease, stage 5: Secondary | ICD-10-CM | POA: Diagnosis not present

## 2021-10-31 DIAGNOSIS — I10 Essential (primary) hypertension: Secondary | ICD-10-CM | POA: Diagnosis not present

## 2021-10-31 DIAGNOSIS — F17210 Nicotine dependence, cigarettes, uncomplicated: Secondary | ICD-10-CM | POA: Diagnosis not present

## 2021-11-02 ENCOUNTER — Other Ambulatory Visit (HOSPITAL_COMMUNITY)
Admission: RE | Admit: 2021-11-02 | Discharge: 2021-11-02 | Disposition: A | Discharge status: Home / Self Care | Payer: Medicare Other | Source: Ambulatory Visit | Attending: Nephrology | Admitting: Nephrology

## 2021-11-02 DIAGNOSIS — N185 Chronic kidney disease, stage 5: Secondary | ICD-10-CM | POA: Diagnosis not present

## 2021-11-02 LAB — CBC
HCT: 35.6 % — ABNORMAL LOW (ref 36.0–46.0)
Hemoglobin: 11.1 g/dL — ABNORMAL LOW (ref 12.0–15.0)
MCH: 31.7 pg (ref 26.0–34.0)
MCHC: 31.2 g/dL (ref 30.0–36.0)
MCV: 101.7 fL — ABNORMAL HIGH (ref 80.0–100.0)
Platelets: 236 10*3/uL (ref 150–400)
RBC: 3.5 MIL/uL — ABNORMAL LOW (ref 3.87–5.11)
RDW: 13.9 % (ref 11.5–15.5)
WBC: 6.5 10*3/uL (ref 4.0–10.5)
nRBC: 0 % (ref 0.0–0.2)

## 2021-11-02 LAB — RENAL FUNCTION PANEL
Albumin: 3.2 g/dL — ABNORMAL LOW (ref 3.5–5.0)
Anion gap: 5 (ref 5–15)
BUN: 32 mg/dL — ABNORMAL HIGH (ref 8–23)
CO2: 22 mmol/L (ref 22–32)
Calcium: 8.7 mg/dL — ABNORMAL LOW (ref 8.9–10.3)
Chloride: 114 mmol/L — ABNORMAL HIGH (ref 98–111)
Creatinine, Ser: 3.7 mg/dL — ABNORMAL HIGH (ref 0.44–1.00)
GFR, Estimated: 13 mL/min — ABNORMAL LOW (ref >60)
Glucose, Bld: 140 mg/dL — ABNORMAL HIGH (ref 70–99)
Phosphorus: 3.8 mg/dL (ref 2.5–4.6)
Potassium: 4 mmol/L (ref 3.5–5.1)
Sodium: 141 mmol/L (ref 135–145)

## 2021-11-02 LAB — PROTEIN / CREATININE RATIO, URINE
Creatinine, Urine: 96.84 mg/dL
Protein Creatinine Ratio: 0.65 mg/mg{Cre} — ABNORMAL HIGH (ref 0.00–0.15)
Total Protein, Urine: 63 mg/dL

## 2021-11-08 DIAGNOSIS — N032 Chronic nephritic syndrome with diffuse membranous glomerulonephritis: Secondary | ICD-10-CM | POA: Diagnosis not present

## 2021-11-08 DIAGNOSIS — D631 Anemia in chronic kidney disease: Secondary | ICD-10-CM | POA: Diagnosis not present

## 2021-11-08 DIAGNOSIS — N189 Chronic kidney disease, unspecified: Secondary | ICD-10-CM | POA: Diagnosis not present

## 2021-11-08 DIAGNOSIS — N185 Chronic kidney disease, stage 5: Secondary | ICD-10-CM | POA: Diagnosis not present

## 2021-11-08 DIAGNOSIS — R809 Proteinuria, unspecified: Secondary | ICD-10-CM | POA: Diagnosis not present

## 2021-11-08 DIAGNOSIS — I129 Hypertensive chronic kidney disease with stage 1 through stage 4 chronic kidney disease, or unspecified chronic kidney disease: Secondary | ICD-10-CM | POA: Diagnosis not present

## 2021-11-10 ENCOUNTER — Other Ambulatory Visit: Payer: Self-pay | Admitting: Internal Medicine

## 2021-11-10 ENCOUNTER — Other Ambulatory Visit (HOSPITAL_COMMUNITY): Payer: Self-pay | Admitting: Internal Medicine

## 2021-11-10 DIAGNOSIS — M545 Low back pain, unspecified: Secondary | ICD-10-CM

## 2021-11-15 ENCOUNTER — Ambulatory Visit (HOSPITAL_COMMUNITY)
Admission: RE | Admit: 2021-11-15 | Discharge: 2021-11-15 | Disposition: A | Discharge status: Home / Self Care | Payer: Medicare Other | Source: Ambulatory Visit | Attending: Internal Medicine | Admitting: Internal Medicine

## 2021-11-15 DIAGNOSIS — M545 Low back pain, unspecified: Secondary | ICD-10-CM | POA: Insufficient documentation

## 2021-11-15 DIAGNOSIS — M4316 Spondylolisthesis, lumbar region: Secondary | ICD-10-CM | POA: Diagnosis not present

## 2021-11-27 ENCOUNTER — Inpatient Hospital Stay (HOSPITAL_COMMUNITY): Payer: Medicare Other | Attending: Hematology

## 2021-11-27 DIAGNOSIS — D631 Anemia in chronic kidney disease: Secondary | ICD-10-CM | POA: Diagnosis not present

## 2021-11-27 DIAGNOSIS — Z8542 Personal history of malignant neoplasm of other parts of uterus: Secondary | ICD-10-CM | POA: Insufficient documentation

## 2021-11-27 DIAGNOSIS — Z08 Encounter for follow-up examination after completed treatment for malignant neoplasm: Secondary | ICD-10-CM | POA: Insufficient documentation

## 2021-11-27 DIAGNOSIS — E538 Deficiency of other specified B group vitamins: Secondary | ICD-10-CM | POA: Diagnosis not present

## 2021-11-27 DIAGNOSIS — N184 Chronic kidney disease, stage 4 (severe): Secondary | ICD-10-CM | POA: Diagnosis not present

## 2021-11-27 DIAGNOSIS — D508 Other iron deficiency anemias: Secondary | ICD-10-CM

## 2021-11-27 LAB — FERRITIN: Ferritin: 221 ng/mL (ref 11–307)

## 2021-11-27 LAB — CBC WITH DIFFERENTIAL/PLATELET
Abs Immature Granulocytes: 0.02 10*3/uL (ref 0.00–0.07)
Basophils Absolute: 0.1 10*3/uL (ref 0.0–0.1)
Basophils Relative: 1 %
Eosinophils Absolute: 0.3 10*3/uL (ref 0.0–0.5)
Eosinophils Relative: 4 %
HCT: 37.1 % (ref 36.0–46.0)
Hemoglobin: 11.7 g/dL — ABNORMAL LOW (ref 12.0–15.0)
Immature Granulocytes: 0 %
Lymphocytes Relative: 20 %
Lymphs Abs: 1.4 10*3/uL (ref 0.7–4.0)
MCH: 31.4 pg (ref 26.0–34.0)
MCHC: 31.5 g/dL (ref 30.0–36.0)
MCV: 99.5 fL (ref 80.0–100.0)
Monocytes Absolute: 0.3 10*3/uL (ref 0.1–1.0)
Monocytes Relative: 5 %
Neutro Abs: 5.2 10*3/uL (ref 1.7–7.7)
Neutrophils Relative %: 70 %
Platelets: 241 10*3/uL (ref 150–400)
RBC: 3.73 MIL/uL — ABNORMAL LOW (ref 3.87–5.11)
RDW: 13.7 % (ref 11.5–15.5)
WBC: 7.3 10*3/uL (ref 4.0–10.5)
nRBC: 0 % (ref 0.0–0.2)

## 2021-11-27 LAB — IRON AND TIBC
Iron: 59 ug/dL (ref 28–170)
Saturation Ratios: 24 % (ref 10.4–31.8)
TIBC: 247 ug/dL — ABNORMAL LOW (ref 250–450)
UIBC: 188 ug/dL

## 2021-11-27 LAB — COMPREHENSIVE METABOLIC PANEL
ALT: 13 U/L (ref 0–44)
AST: 17 U/L (ref 15–41)
Albumin: 3.5 g/dL (ref 3.5–5.0)
Alkaline Phosphatase: 87 U/L (ref 38–126)
Anion gap: 8 (ref 5–15)
BUN: 36 mg/dL — ABNORMAL HIGH (ref 8–23)
CO2: 23 mmol/L (ref 22–32)
Calcium: 9 mg/dL (ref 8.9–10.3)
Chloride: 110 mmol/L (ref 98–111)
Creatinine, Ser: 3.68 mg/dL — ABNORMAL HIGH (ref 0.44–1.00)
GFR, Estimated: 13 mL/min — ABNORMAL LOW (ref >60)
Glucose, Bld: 100 mg/dL — ABNORMAL HIGH (ref 70–99)
Potassium: 3.9 mmol/L (ref 3.5–5.1)
Sodium: 141 mmol/L (ref 135–145)
Total Bilirubin: 0.7 mg/dL (ref 0.3–1.2)
Total Protein: 7.1 g/dL (ref 6.5–8.1)

## 2021-11-27 LAB — VITAMIN B12: Vitamin B-12: 353 pg/mL (ref 180–914)

## 2021-11-30 LAB — METHYLMALONIC ACID, SERUM: Methylmalonic Acid, Quantitative: 368 nmol/L (ref 0–378)

## 2021-12-02 NOTE — Progress Notes (Unsigned)
Shannon [name], [place] [address]   CLINIC:  Medical Oncology/Hematology  PCP:  [name], MD [place] [address] [phone]   REASON FOR VISIT:  Follow-up for anemia of CKD stage IV/V  PRIOR THERAPY: Retacrit  CURRENT THERAPY: Observation  INTERVAL HISTORY:  Shannon [name] 66 y.o. female returns for routine follow-up of her anemia of CKD stage IV/V.  She was last seen by Tarri Abernethy PA-C on 05/30/2021.  At today's visit, she reports feeling well.   *** No recent hospitalizations, surgeries, or changes in baseline health status. *** She has not noticed any bleeding such as epistaxis, hematemesis, hematochezia, or melena. *** She denies any symptoms of fatigue, pica, restless legs, headaches. *** No chest pain, dyspnea on exertion, lightheadedness, or syncope. *** Regarding her history of endometrial cancer, she denies any vaginal bleeding, abdominal pain, or B symptoms at this time.  Her last appointment with GYN/oncology was with Dr. Berline Lopes on 10/12/2021.  She has 100% energy and 100% appetite. ***  She endorses that she is maintaining a stable weight.   REVIEW OF SYSTEMS:  ***  Review of Systems  Constitutional:  Negative for appetite change, chills, diaphoresis, fatigue, fever and unexpected weight change.  HENT:   Negative for lump/mass and nosebleeds.   Eyes:  Negative for eye problems.  Respiratory:  Negative for cough, hemoptysis and shortness of breath.   Cardiovascular:  Negative for chest pain, leg swelling and palpitations.  Gastrointestinal:  Negative for abdominal pain, blood in stool, constipation, diarrhea, nausea and vomiting.  Genitourinary:  Negative for hematuria.   Skin: Negative.   Neurological:  Negative for dizziness, headaches and light-headedness.  Hematological:  Does not bruise/bleed easily.      PAST MEDICAL/SURGICAL HISTORY:  Past Medical History:  Diagnosis Date   Absolute anemia  11/10/2015   Anemia    Anemia of renal disease 11/10/2015   Asthma    has Albuterol inhaler prn   CHF (congestive heart failure) ([place])    acute systolic CHF 05/[facility])   Chronic kidney disease    Membranous nephropathy ( Seen at [facility])   Endometrial adenocarcinoma ([place])    Gastric ulcer    History of blood transfusion    no abnormal reaction noted   History of bronchitis    last time 3-7yr ago   Hyperlipidemia    takes Pravastatin daily   Hypertension    takes Imdur and Hydralazine daily as well as Coreg   Hypothyroidism    takes Synthroid daily   Itching    on legs-has a Kenalog cream   Metabolic bone disease    Nausea    takes Zofran prn   [place]ischemic cardiomyopathy [place]    12/2011 normal coronaries, EF 22% (EF normal 04/2013)   Obesity    Peripheral edema    takes Torsemide daily   Thyroid disease    hypothyroidism   Past Surgical History:  Procedure Laterality Date   AV FISTULA PLACEMENT Left 07/22/2012   Procedure: INSERTION OF ARTERIOVENOUS GORE-TEX GRAFT ARM;  Surgeon: CAngelia Mould MD;  [facility]  Service: Vascular;  Laterality: Left;   AV FISTULA PLACEMENT Right 05/24/2017   Procedure: INSERTION OF ARTERIOVENOUS (AV) GORE-TEX GRAFT RIGHT ARM;  Surgeon: DAngelia Mould MD;  [facility]  Service: Vascular;  Laterality: Right;   CESAREAN SECTION     30+yrs ago   COLONOSCOPY     DILATION AND CURETTAGE, DIAGNOSTIC /  THERAPEUTIC     left arm surgery with pin     15+yrs ago   ROBOTIC ASSISTED TOTAL HYSTERECTOMY WITH BILATERAL SALPINGO OOPHERECTOMY Bilateral 09/24/2019   Procedure: XI ROBOTIC ASSISTED TOTAL HYSTERECTOMY WITH BILATERAL SALPINGO OOPHORECTOMY;  Surgeon: [name], MD;  Location[facility];  Service: Gynecology;  Laterality: Bilateral;   SENTINEL NODE BIOPSY N/A 09/24/2019   Procedure: SENTINEL NODE BIOPSY;  Surgeon: [name], MD;  Location: [facility];  Service: Gynecology;  Laterality: N/A;   TUBAL LIGATION        SOCIAL HISTORY:  Social History   Socioeconomic History   Marital status: Single    Spouse name: Not on file   Number of children: Not on file   Years of education: Not on file   Highest education level: Not on file  Occupational History   Not on file  Tobacco Use   Smoking status: Every Day    Packs/day: 1.00    Years: 40.00    Total pack years: 40.00    Types: Cigarettes    Start date: 05/14/1968   Smokeless tobacco: Never  Vaping Use   Vaping Use: Never used  Substance and Sexual Activity   Alcohol use: No   Drug use: No   Sexual activity: Not Currently  Other Topics Concern   Not on file  Social History Narrative   Not on file   Social Determinants of Health   Financial Resource Strain: Not on file  Food Insecurity: Not on file  Transportation Needs: Not on file  Physical Activity: Not on file  Stress: Not on file  Social Connections: Not on file  Intimate Partner Violence: Not on file    FAMILY HISTORY:  Family History  Problem Relation Age of Onset   Kidney disease Other    Diabetes Mother    Breast cancer Neg Hx    Colon cancer Neg Hx    Uterine cancer Neg Hx    Ovarian cancer Neg Hx     CURRENT MEDICATIONS:  Outpatient Encounter Medications as of 12/04/2021  Medication Sig   acetaminophen (TYLENOL) 500 MG tablet Take 1,000 mg by mouth daily as needed for moderate pain or headache.   albuterol (PROVENTIL HFA;VENTOLIN HFA) 108 (90 BASE) MCG/ACT inhaler Inhale 1-2 puffs into the lungs every 6 (six) hours as needed for wheezing.    augmented betamethasone dipropionate (DIPROLENE-AF) 0.05 % cream Apply 1 application topically 2 (two) times daily as needed (skin irritation.).    carvedilol (COREG) 12.5 MG tablet Take 12.5 mg by mouth 2 (two) times daily.   clobetasol cream (TEMOVATE) 5.39 % Apply 1 application topically daily as needed (rash).    hydrALAZINE (APRESOLINE) 50 MG tablet Take 50 mg by mouth 3 (three) times daily.    hydrocortisone 1 %  ointment SMARTSIG:Topical 1-3 Times Daily PRN   isosorbide mononitrate (IMDUR) 30 MG 24 hr tablet Take 30 mg by mouth daily.   metolazone (ZAROXOLYN) 5 MG tablet Take 5 mg by mouth 2 (two) times daily as needed (swelling).    pravastatin (PRAVACHOL) 80 MG tablet Take 80 mg by mouth daily.   raloxifene (EVISTA) 60 MG tablet Take 60 mg by mouth daily.   RAYALDEE 30 MCG CPCR Take 30 mcg by mouth daily.    sodium bicarbonate 650 MG tablet Take 1,300 mg by mouth 3 (three) times daily.    torsemide (DEMADEX) 20 MG tablet Take 20 mg by mouth daily as needed (swelling).   No facility-administered encounter  medications on file as of 12/04/2021.    ALLERGIES:  Allergies  Allergen Reactions   Corticosteroids Hives, Itching, Nausea And Vomiting and Rash    Tolerated dexamethasone when being treated for a reaction to rituximab   Other Hives, Itching, Nausea And Vomiting and Rash    Tolerated dexamethasone when being treated for a reaction to rituximab Tolerated dexamethasone when being treated for a reaction to rituximab  Tolerated dexamethasone when being treated for a reaction to rituximab   Rituximab Shortness Of Breath    wheezing wheezing      PHYSICAL EXAM:  ***  ECOG PERFORMANCE STATUS: 0 - Asymptomatic  There were no vitals filed for this visit. There were no vitals filed for this visit. Physical Exam Constitutional:      Appearance: Normal appearance. She is obese.  HENT:     Head: Normocephalic and atraumatic.     Mouth/Throat:     Mouth: Mucous membranes are moist.  Eyes:     Extraocular Movements: Extraocular movements intact.     Pupils: Pupils are equal, round, and reactive to light.  Cardiovascular:     Rate and Rhythm: Normal rate and regular rhythm.     Pulses: Normal pulses.     Heart sounds: Normal heart sounds.  Pulmonary:     Effort: Pulmonary effort is normal.     Breath sounds: Normal breath sounds.  Abdominal:     General: Bowel sounds are normal.      Palpations: Abdomen is soft.     Tenderness: There is no abdominal tenderness.  Musculoskeletal:        General: No swelling.     Right lower leg: No edema.     Left lower leg: No edema.  Lymphadenopathy:     Cervical: No cervical adenopathy.  Skin:    General: Skin is warm and dry.  Neurological:     General: No focal deficit present.     Mental Status: She is alert and oriented to person, place, and time.  Psychiatric:        Mood and Affect: Mood normal.        Behavior: Behavior normal.      LABORATORY DATA:  I have reviewed the labs as listed.  CBC    Component Value Date/Time   WBC 7.3 11/27/2021 0909   RBC 3.73 (L) 11/27/2021 0909   HGB 11.7 (L) 11/27/2021 0909   HCT 37.1 11/27/2021 0909   PLT 241 11/27/2021 0909   MCV 99.5 11/27/2021 0909   MCH 31.4 11/27/2021 0909   MCHC 31.5 11/27/2021 0909   RDW 13.7 11/27/2021 0909   LYMPHSABS 1.4 11/27/2021 0909   MONOABS 0.3 11/27/2021 0909   EOSABS 0.3 11/27/2021 0909   BASOSABS 0.1 11/27/2021 0909      Latest Ref Rng & Units 11/27/2021    9:09 AM 11/02/2021    8:22 AM 09/12/2021   11:07 AM  CMP  Glucose 70 - 99 mg/dL 100  140  125   BUN 8 - 23 mg/dL 36  32  28   Creatinine 0.44 - 1.00 mg/dL 3.68  3.70  3.74   Sodium 135 - 145 mmol/L 141  141  140   Potassium 3.5 - 5.1 mmol/L 3.9  4.0  3.8   Chloride 98 - 111 mmol/L 110  114  111   CO2 22 - 32 mmol/L '23  22  21   '$ Calcium 8.9 - 10.3 mg/dL 9.0  8.7  8.9  9.1   Total Protein 6.5 - 8.1 g/dL 7.1     Total Bilirubin 0.3 - 1.2 mg/dL 0.7     Alkaline Phos 38 - 126 U/L 87     AST 15 - 41 U/L 17     ALT 0 - 44 U/L 13       DIAGNOSTIC IMAGING:  I have independently reviewed the relevant imaging and discussed with the patient.  ASSESSMENT & PLAN: 1.  Anemia due to chronic kidney disease: - Normocytic anemia secondary to chronic kidney disease and functional iron deficiency - She was previously on Aranesp, discontinued May 2020. - Previously was scheduled for  Retacrit 40,000 units monthly, but she has not required any Retacrit since 10/16/2019. - She has not required any IV iron - Patient had a negative SPEP. - Last colonoscopy was reportedly done in [place], around 7 years ago. - It is recorded that she has a history of AVMs and esophageal varices. - No bright red blood per rectum or melena  ***  - Most recent labs (11/27/2021): Hgb 11.7/MCV 99.5, ferritin 221, iron saturation 24% - PLAN: She does not require ESA or IV iron at this time. *** - Since patient has not required ESA for the past 2 years and has never required IV iron, we will tentatively discharge her from the hematology clinic.  She follows regularly with her nephrologist (Dr. Theador Hawthorne), who monitors her hemoglobin and iron levels.  I have personally discussed this with Dr. Theador Hawthorne to ensure continuity of care, and he will refer patient back to [place] if she requires hematology assistance in the future.  ***   2.  Vitamin B12 deficiency: - She was placed on sublingual vitamin B12 ***  -Most recent vitamin B12 (11/27/2021): Normal at 353 with normal methylmalonic acid. - PLAN: Continue daily B12. ***   3.  Endometrial adenocarcinoma, stage Ia, grade 1 (diagnosed May 2021) - Early stage endometrial cancer diagnosed in April 2021, s/p surgical resection (09/24/2019) - She follows with GYN oncologist (Dr. Jeral Pinch), last seen on 10/12/2021 with plans to continue annual GYN/oncology follow-up - She denies any current symptoms of vaginal bleeding or discharge, abdominal pain, or B symptoms ***  - PLAN: Continue annual follow-up visits per GYN oncology  4.   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 ***  - Most recent CMP (11/27/2021): Creatinine 3.68/GFR 13, at baseline - PLAN: Continue follow-up with Dr. Theador Hawthorne   PLAN SUMMARY & DISPOSITION: Tentatively discharge from hematology  clinic, patient can follow-up as needed.  *** If she requires hematology services within the next 3 years, she will NOT need a new patient referral, but if she needs to reestablish care after 12/04/2024, she will require a new referral.  Patient verbalizes understanding.  All questions were answered. The patient knows to call the clinic with any problems, questions or concerns.  Medical decision making: Low ***   Time spent on visit: I spent  ***  minutes counseling the patient face to face. The total time spent in the appointment was  ***  minutes and more than 50% was on counseling.   Harriett Rush, PA-C   ***

## 2021-12-04 ENCOUNTER — Inpatient Hospital Stay (HOSPITAL_COMMUNITY): Payer: Medicare Other | Admitting: Physician Assistant

## 2021-12-04 VITALS — BP 150/68 | HR 85 | Temp 98.0°F | Resp 18 | Wt 224.0 lb

## 2021-12-04 DIAGNOSIS — N184 Chronic kidney disease, stage 4 (severe): Secondary | ICD-10-CM | POA: Diagnosis not present

## 2021-12-04 DIAGNOSIS — Z08 Encounter for follow-up examination after completed treatment for malignant neoplasm: Secondary | ICD-10-CM | POA: Diagnosis not present

## 2021-12-04 DIAGNOSIS — D631 Anemia in chronic kidney disease: Secondary | ICD-10-CM

## 2021-12-04 DIAGNOSIS — E538 Deficiency of other specified B group vitamins: Secondary | ICD-10-CM

## 2021-12-04 DIAGNOSIS — N189 Chronic kidney disease, unspecified: Secondary | ICD-10-CM | POA: Diagnosis not present

## 2021-12-04 NOTE — Patient Instructions (Addendum)
[place] at [facility] Discharge Instructions  You were seen today by Tarri Abernethy PA-C for your anemia related to your chronic kidney disease.  Your blood and iron levels looked great!  You do not need any shots for your blood at this time.    Overall, your blood counts have been stable for the past several years and you have not needed any injections for your blood since 2021.  At this time, you do not need to follow-up with the Hematology Clinic (at [facility]).  Your kidney doctor (Dr. Theador Hawthorne) will continue to monitor your blood and iron levels, and will refer you back to [place] in the future if needed.  You should continue your daily B12 supplement.  You should continue to follow-up with Dr. Jeral Pinch (gynecology/oncology) for management of your history of endometrial cancer.   - - - - - - - - - - - - - - - - - -    Thank you for choosing [place] at [facility] to provide your oncology and hematology care.  To afford each patient quality time with our provider, please arrive at least 15 minutes before your scheduled appointment time.   If you have a lab appointment with the [place] please come in thru the Main Entrance and check in at the main information desk.  You need to re-schedule your appointment should you arrive 10 or more minutes late.  We strive to give you quality time with our providers, and arriving late affects you and other patients whose appointments are after yours.  Also, if you no show three or more times for appointments you may be dismissed from the clinic at the providers discretion.     Again, thank you for choosing [facility].  Our hope is that these requests will decrease the amount of time that you wait before being seen by our physicians.       _____________________________________________________________  Should you have questions after your visit to [facility], please contact our office at [phone] and follow the prompts.  Our office hours are 8:00 a.m. and 4:30 p.m. Monday - Friday.  Please note that voicemails left after 4:00 p.m. may not be returned until the following business day.  We are closed weekends and major holidays.  You do have access to a nurse 24-7, just call the main number to the clinic [phone] and do not press any options, hold on the line and a nurse will answer the phone.    For prescription refill requests, have your pharmacy contact our office and allow 72 hours.    Due to Covid, you will need to wear a mask upon entering the hospital. If you do not have a mask, a mask will be given to you at the Main Entrance upon arrival. For doctor visits, patients may have 1 support person age 58 or older with them. For treatment visits, patients can not have anyone with them due to social distancing guidelines and our immunocompromised population.

## 2021-12-11 DIAGNOSIS — K219 Gastro-esophageal reflux disease without esophagitis: Secondary | ICD-10-CM | POA: Diagnosis not present

## 2021-12-11 DIAGNOSIS — E039 Hypothyroidism, unspecified: Secondary | ICD-10-CM | POA: Diagnosis not present

## 2021-12-26 DIAGNOSIS — E7800 Pure hypercholesterolemia, unspecified: Secondary | ICD-10-CM | POA: Diagnosis not present

## 2021-12-26 DIAGNOSIS — I509 Heart failure, unspecified: Secondary | ICD-10-CM | POA: Diagnosis not present

## 2021-12-26 DIAGNOSIS — Z299 Encounter for prophylactic measures, unspecified: Secondary | ICD-10-CM | POA: Diagnosis not present

## 2021-12-26 DIAGNOSIS — I10 Essential (primary) hypertension: Secondary | ICD-10-CM | POA: Diagnosis not present

## 2021-12-26 DIAGNOSIS — Z7189 Other specified counseling: Secondary | ICD-10-CM | POA: Diagnosis not present

## 2021-12-26 DIAGNOSIS — R5383 Other fatigue: Secondary | ICD-10-CM | POA: Diagnosis not present

## 2021-12-26 DIAGNOSIS — Z0000 Encounter for general adult medical examination without abnormal findings: Secondary | ICD-10-CM | POA: Diagnosis not present

## 2021-12-26 DIAGNOSIS — Z713 Dietary counseling and surveillance: Secondary | ICD-10-CM | POA: Diagnosis not present

## 2021-12-26 DIAGNOSIS — Z79899 Other long term (current) drug therapy: Secondary | ICD-10-CM | POA: Diagnosis not present

## 2022-01-04 LAB — COLOGUARD

## 2022-01-05 ENCOUNTER — Other Ambulatory Visit (HOSPITAL_COMMUNITY)
Admission: RE | Admit: 2022-01-05 | Discharge: 2022-01-05 | Disposition: A | Discharge status: Home / Self Care | Payer: Medicare Other | Source: Ambulatory Visit | Attending: Nephrology | Admitting: Nephrology

## 2022-01-05 DIAGNOSIS — N185 Chronic kidney disease, stage 5: Secondary | ICD-10-CM | POA: Diagnosis not present

## 2022-01-05 LAB — RENAL FUNCTION PANEL
Albumin: 3.4 g/dL — ABNORMAL LOW (ref 3.5–5.0)
Anion gap: 5 (ref 5–15)
BUN: 33 mg/dL — ABNORMAL HIGH (ref 8–23)
CO2: 26 mmol/L (ref 22–32)
Calcium: 8.7 mg/dL — ABNORMAL LOW (ref 8.9–10.3)
Chloride: 110 mmol/L (ref 98–111)
Creatinine, Ser: 3.88 mg/dL — ABNORMAL HIGH (ref 0.44–1.00)
GFR, Estimated: 12 mL/min — ABNORMAL LOW (ref >60)
Glucose, Bld: 102 mg/dL — ABNORMAL HIGH (ref 70–99)
Phosphorus: 4.4 mg/dL (ref 2.5–4.6)
Potassium: 4 mmol/L (ref 3.5–5.1)
Sodium: 141 mmol/L (ref 135–145)

## 2022-01-05 LAB — CBC
HCT: 36.8 % (ref 36.0–46.0)
Hemoglobin: 11.7 g/dL — ABNORMAL LOW (ref 12.0–15.0)
MCH: 32.1 pg (ref 26.0–34.0)
MCHC: 31.8 g/dL (ref 30.0–36.0)
MCV: 100.8 fL — ABNORMAL HIGH (ref 80.0–100.0)
Platelets: 230 10*3/uL (ref 150–400)
RBC: 3.65 MIL/uL — ABNORMAL LOW (ref 3.87–5.11)
RDW: 13.3 % (ref 11.5–15.5)
WBC: 7.7 10*3/uL (ref 4.0–10.5)
nRBC: 0 % (ref 0.0–0.2)

## 2022-01-05 LAB — PROTEIN / CREATININE RATIO, URINE
Creatinine, Urine: 91.17 mg/dL
Protein Creatinine Ratio: 0.77 mg/mg{Cre} — ABNORMAL HIGH (ref 0.00–0.15)
Total Protein, Urine: 70 mg/dL

## 2022-01-10 DIAGNOSIS — N185 Chronic kidney disease, stage 5: Secondary | ICD-10-CM | POA: Diagnosis not present

## 2022-01-10 DIAGNOSIS — I129 Hypertensive chronic kidney disease with stage 1 through stage 4 chronic kidney disease, or unspecified chronic kidney disease: Secondary | ICD-10-CM | POA: Diagnosis not present

## 2022-01-10 DIAGNOSIS — R809 Proteinuria, unspecified: Secondary | ICD-10-CM | POA: Diagnosis not present

## 2022-01-10 DIAGNOSIS — N189 Chronic kidney disease, unspecified: Secondary | ICD-10-CM | POA: Diagnosis not present

## 2022-01-10 DIAGNOSIS — D631 Anemia in chronic kidney disease: Secondary | ICD-10-CM | POA: Diagnosis not present

## 2022-01-10 DIAGNOSIS — E211 Secondary hyperparathyroidism, not elsewhere classified: Secondary | ICD-10-CM | POA: Diagnosis not present

## 2022-01-10 DIAGNOSIS — N032 Chronic nephritic syndrome with diffuse membranous glomerulonephritis: Secondary | ICD-10-CM | POA: Diagnosis not present

## 2022-01-10 DIAGNOSIS — E8722 Chronic metabolic acidosis: Secondary | ICD-10-CM | POA: Diagnosis not present

## 2022-01-23 DIAGNOSIS — Z1211 Encounter for screening for malignant neoplasm of colon: Secondary | ICD-10-CM | POA: Diagnosis not present

## 2022-01-23 DIAGNOSIS — Z1212 Encounter for screening for malignant neoplasm of rectum: Secondary | ICD-10-CM | POA: Diagnosis not present

## 2022-01-24 DIAGNOSIS — I10 Essential (primary) hypertension: Secondary | ICD-10-CM | POA: Diagnosis not present

## 2022-01-24 DIAGNOSIS — Z299 Encounter for prophylactic measures, unspecified: Secondary | ICD-10-CM | POA: Diagnosis not present

## 2022-01-24 DIAGNOSIS — J449 Chronic obstructive pulmonary disease, unspecified: Secondary | ICD-10-CM | POA: Diagnosis not present

## 2022-01-24 DIAGNOSIS — R21 Rash and other nonspecific skin eruption: Secondary | ICD-10-CM | POA: Diagnosis not present

## 2022-01-24 DIAGNOSIS — Z23 Encounter for immunization: Secondary | ICD-10-CM | POA: Diagnosis not present

## 2022-01-24 DIAGNOSIS — M48061 Spinal stenosis, lumbar region without neurogenic claudication: Secondary | ICD-10-CM | POA: Diagnosis not present

## 2022-01-28 LAB — COLOGUARD: COLOGUARD: POSITIVE — AB

## 2022-02-01 DIAGNOSIS — Z299 Encounter for prophylactic measures, unspecified: Secondary | ICD-10-CM | POA: Diagnosis not present

## 2022-02-01 DIAGNOSIS — J069 Acute upper respiratory infection, unspecified: Secondary | ICD-10-CM | POA: Diagnosis not present

## 2022-02-01 DIAGNOSIS — I10 Essential (primary) hypertension: Secondary | ICD-10-CM | POA: Diagnosis not present

## 2022-02-01 DIAGNOSIS — F17210 Nicotine dependence, cigarettes, uncomplicated: Secondary | ICD-10-CM | POA: Diagnosis not present

## 2022-02-06 DIAGNOSIS — M47816 Spondylosis without myelopathy or radiculopathy, lumbar region: Secondary | ICD-10-CM | POA: Diagnosis not present

## 2022-02-06 DIAGNOSIS — M48062 Spinal stenosis, lumbar region with neurogenic claudication: Secondary | ICD-10-CM | POA: Diagnosis not present

## 2022-02-20 DIAGNOSIS — R195 Other fecal abnormalities: Secondary | ICD-10-CM | POA: Diagnosis not present

## 2022-02-23 DIAGNOSIS — M48062 Spinal stenosis, lumbar region with neurogenic claudication: Secondary | ICD-10-CM | POA: Diagnosis not present

## 2022-03-15 ENCOUNTER — Other Ambulatory Visit (HOSPITAL_COMMUNITY)
Admission: RE | Admit: 2022-03-15 | Discharge: 2022-03-15 | Disposition: A | Discharge status: Home / Self Care | Payer: Medicare Other | Source: Ambulatory Visit | Attending: Nephrology | Admitting: Nephrology

## 2022-03-15 DIAGNOSIS — N185 Chronic kidney disease, stage 5: Secondary | ICD-10-CM | POA: Diagnosis not present

## 2022-03-15 LAB — RENAL FUNCTION PANEL
Albumin: 3.5 g/dL (ref 3.5–5.0)
Anion gap: 8 (ref 5–15)
BUN: 37 mg/dL — ABNORMAL HIGH (ref 8–23)
CO2: 22 mmol/L (ref 22–32)
Calcium: 9.1 mg/dL (ref 8.9–10.3)
Chloride: 110 mmol/L (ref 98–111)
Creatinine, Ser: 4.37 mg/dL — ABNORMAL HIGH (ref 0.44–1.00)
GFR, Estimated: 11 mL/min — ABNORMAL LOW (ref >60)
Glucose, Bld: 103 mg/dL — ABNORMAL HIGH (ref 70–99)
Phosphorus: 4 mg/dL (ref 2.5–4.6)
Potassium: 4 mmol/L (ref 3.5–5.1)
Sodium: 140 mmol/L (ref 135–145)

## 2022-03-15 LAB — CBC
HCT: 36.7 % (ref 36.0–46.0)
Hemoglobin: 11.6 g/dL — ABNORMAL LOW (ref 12.0–15.0)
MCH: 31.4 pg (ref 26.0–34.0)
MCHC: 31.6 g/dL (ref 30.0–36.0)
MCV: 99.5 fL (ref 80.0–100.0)
Platelets: 218 10*3/uL (ref 150–400)
RBC: 3.69 MIL/uL — ABNORMAL LOW (ref 3.87–5.11)
RDW: 13.5 % (ref 11.5–15.5)
WBC: 7.8 10*3/uL (ref 4.0–10.5)
nRBC: 0 % (ref 0.0–0.2)

## 2022-03-15 LAB — PROTEIN / CREATININE RATIO, URINE
Creatinine, Urine: 119.55 mg/dL
Protein Creatinine Ratio: 0.62 mg/mg{Cre} — ABNORMAL HIGH (ref 0.00–0.15)
Total Protein, Urine: 74 mg/dL

## 2022-03-16 LAB — PTH, INTACT AND CALCIUM
Calcium, Total (PTH): 9.1 mg/dL (ref 8.7–10.3)
PTH: 59 pg/mL (ref 15–65)

## 2022-03-19 ENCOUNTER — Ambulatory Visit (HOSPITAL_COMMUNITY): Payer: Medicare Other | Admitting: Physical Therapy

## 2022-03-26 DIAGNOSIS — D125 Benign neoplasm of sigmoid colon: Secondary | ICD-10-CM | POA: Diagnosis not present

## 2022-03-26 DIAGNOSIS — N189 Chronic kidney disease, unspecified: Secondary | ICD-10-CM | POA: Diagnosis not present

## 2022-03-26 DIAGNOSIS — Z79899 Other long term (current) drug therapy: Secondary | ICD-10-CM | POA: Diagnosis not present

## 2022-03-26 DIAGNOSIS — F17210 Nicotine dependence, cigarettes, uncomplicated: Secondary | ICD-10-CM | POA: Diagnosis not present

## 2022-03-26 DIAGNOSIS — D490 Neoplasm of unspecified behavior of digestive system: Secondary | ICD-10-CM | POA: Diagnosis not present

## 2022-03-26 DIAGNOSIS — D127 Benign neoplasm of rectosigmoid junction: Secondary | ICD-10-CM | POA: Diagnosis not present

## 2022-03-26 DIAGNOSIS — I429 Cardiomyopathy, unspecified: Secondary | ICD-10-CM | POA: Diagnosis not present

## 2022-03-26 DIAGNOSIS — I10 Essential (primary) hypertension: Secondary | ICD-10-CM | POA: Diagnosis not present

## 2022-03-26 DIAGNOSIS — D126 Benign neoplasm of colon, unspecified: Secondary | ICD-10-CM | POA: Diagnosis not present

## 2022-03-26 DIAGNOSIS — Z1211 Encounter for screening for malignant neoplasm of colon: Secondary | ICD-10-CM | POA: Diagnosis not present

## 2022-03-26 DIAGNOSIS — K635 Polyp of colon: Secondary | ICD-10-CM | POA: Diagnosis not present

## 2022-03-26 DIAGNOSIS — D120 Benign neoplasm of cecum: Secondary | ICD-10-CM | POA: Diagnosis not present

## 2022-03-26 DIAGNOSIS — R195 Other fecal abnormalities: Secondary | ICD-10-CM | POA: Diagnosis not present

## 2022-03-26 DIAGNOSIS — I129 Hypertensive chronic kidney disease with stage 1 through stage 4 chronic kidney disease, or unspecified chronic kidney disease: Secondary | ICD-10-CM | POA: Diagnosis not present

## 2022-03-29 ENCOUNTER — Ambulatory Visit (HOSPITAL_COMMUNITY): Payer: Medicare Other | Admitting: Physical Therapy

## 2022-04-03 DIAGNOSIS — M48062 Spinal stenosis, lumbar region with neurogenic claudication: Secondary | ICD-10-CM | POA: Diagnosis not present

## 2022-04-07 DIAGNOSIS — N185 Chronic kidney disease, stage 5: Secondary | ICD-10-CM | POA: Diagnosis not present

## 2022-04-07 DIAGNOSIS — N189 Chronic kidney disease, unspecified: Secondary | ICD-10-CM | POA: Diagnosis not present

## 2022-04-07 DIAGNOSIS — R809 Proteinuria, unspecified: Secondary | ICD-10-CM | POA: Diagnosis not present

## 2022-04-07 DIAGNOSIS — N032 Chronic nephritic syndrome with diffuse membranous glomerulonephritis: Secondary | ICD-10-CM | POA: Diagnosis not present

## 2022-04-07 DIAGNOSIS — I129 Hypertensive chronic kidney disease with stage 1 through stage 4 chronic kidney disease, or unspecified chronic kidney disease: Secondary | ICD-10-CM | POA: Diagnosis not present

## 2022-04-07 DIAGNOSIS — D631 Anemia in chronic kidney disease: Secondary | ICD-10-CM | POA: Diagnosis not present

## 2022-04-09 ENCOUNTER — Other Ambulatory Visit: Payer: Self-pay | Admitting: Internal Medicine

## 2022-04-09 DIAGNOSIS — I509 Heart failure, unspecified: Secondary | ICD-10-CM | POA: Diagnosis not present

## 2022-04-09 DIAGNOSIS — I25119 Atherosclerotic heart disease of native coronary artery with unspecified angina pectoris: Secondary | ICD-10-CM | POA: Diagnosis not present

## 2022-04-09 DIAGNOSIS — F17210 Nicotine dependence, cigarettes, uncomplicated: Secondary | ICD-10-CM | POA: Diagnosis not present

## 2022-04-09 DIAGNOSIS — Z1231 Encounter for screening mammogram for malignant neoplasm of breast: Secondary | ICD-10-CM

## 2022-04-09 DIAGNOSIS — I10 Essential (primary) hypertension: Secondary | ICD-10-CM | POA: Diagnosis not present

## 2022-04-09 DIAGNOSIS — Z299 Encounter for prophylactic measures, unspecified: Secondary | ICD-10-CM | POA: Diagnosis not present

## 2022-04-09 DIAGNOSIS — Z0000 Encounter for general adult medical examination without abnormal findings: Secondary | ICD-10-CM | POA: Diagnosis not present

## 2022-04-12 DIAGNOSIS — D120 Benign neoplasm of cecum: Secondary | ICD-10-CM | POA: Diagnosis not present

## 2022-04-18 ENCOUNTER — Ambulatory Visit (HOSPITAL_COMMUNITY): Payer: Medicare Other | Attending: Pain Medicine | Admitting: Physical Therapy

## 2022-04-18 DIAGNOSIS — M5459 Other low back pain: Secondary | ICD-10-CM | POA: Insufficient documentation

## 2022-04-18 DIAGNOSIS — M5416 Radiculopathy, lumbar region: Secondary | ICD-10-CM | POA: Diagnosis not present

## 2022-04-18 NOTE — Therapy (Signed)
OUTPATIENT PHYSICAL THERAPY THORACOLUMBAR EVALUATION   Patient Name: Shannon [name] MRN: [id number] DOB:02/18/1956, 66 y.o., female Today's Date: 04/18/2022  END OF SESSION:  PT End of Session - 04/18/22 1458     Visit Number 1    Number of Visits 12    Date for PT Re-Evaluation 05/30/22    Authorization Type [employer] Medicare    Progress Note Due on Visit 10    PT Start Time 1425    PT Stop Time 1505    PT Time Calculation (min) 40 min    Activity Tolerance Patient tolerated treatment well    Behavior During Therapy WFL for tasks assessed/performed             Past Medical History:  Diagnosis Date   Absolute anemia 11/10/2015   Anemia    Anemia of renal disease 11/10/2015   Asthma    has Albuterol inhaler prn   CHF (congestive heart failure) ([place])    acute systolic CHF 0/[facility])   Chronic kidney disease    Membranous nephropathy ( Seen at [facility])   Endometrial adenocarcinoma ([place])    Gastric ulcer    History of blood transfusion    no abnormal reaction noted   History of bronchitis    last time 3-91yr ago   Hyperlipidemia    takes Pravastatin daily   Hypertension    takes Imdur and Hydralazine daily as well as Coreg   Hypothyroidism    takes Synthroid daily   Itching    on legs-has a Kenalog cream   Metabolic bone disease    Nausea    takes Zofran prn   Nonischemic cardiomyopathy ([place]    12/2011 normal coronaries, EF 22% (EF normal 04/2013)   Obesity    Peripheral edema    takes Torsemide daily   Thyroid disease    hypothyroidism   Past Surgical History:  Procedure Laterality Date   AV FISTULA PLACEMENT Left 07/22/2012   Procedure: INSERTION OF ARTERIOVENOUS GORE-TEX GRAFT ARM;  Surgeon: CAngelia Mould MD;  [place]  Service: Vascular;  Laterality: Left;   AV FISTULA PLACEMENT Right 05/24/2017   Procedure: INSERTION OF ARTERIOVENOUS (AV) GORE-TEX GRAFT RIGHT ARM;  Surgeon: DAngelia Mould MD;  [place]  Service:  Vascular;  Laterality: Right;   CESAREAN SECTION     30+yrs ago   COLONOSCOPY     DILATION AND CURETTAGE, DIAGNOSTIC / THERAPEUTIC     left arm surgery with pin     15+yrs ago   ROBOTIC ASSISTED TOTAL HYSTERECTOMY WITH BILATERAL SALPINGO OOPHERECTOMY Bilateral 09/24/2019   Procedure: XI ROBOTIC [place]  Surgeon: [name] MD;  [facility];  Service: Gynecology;  Laterality: Bilateral;   SENTINEL NODE BIOPSY N/A 09/24/2019   Procedure: SENTINEL NODE BIOPSY;  Surgeon: [name] MD;  [facility];  Service: Gynecology;  Laterality: N/A;   TUBAL LIGATION     Patient Active Problem List   Diagnosis Date Noted   CKD (chronic kidney disease), stage V ([place] 08/20/2019   Endometrial/uterine adenocarcinoma ([place] 08/20/2019   Anemia associated with chronic renal failure 11/10/2015   Anemia of renal disease 11/10/2015   Cardiomyopathy ([place] 04/21/2013   HTN (hypertension) 04/21/2013   Hyperlipidemia 04/21/2013   Tobacco abuse 04/21/2013   End stage renal disease ([place] 08/06/2012   Chronic kidney disease, stage IV (severe) ([place] 07/09/2012    PCP: DJerene BearsMD  REFERRING PROVIDER: KEarlyne Iba  MD  REFERRING DIAG: lumbar radiculopathy, spinal stenosis w/neutogenic claudication  Rationale for Evaluation and Treatment: Rehabilitation  THERAPY DIAG:  Other low back pain  Radiculopathy, lumbar region  ONSET DATE: 6-7 months ago   SUBJECTIVE:                                                                                                                                                                                           SUBJECTIVE STATEMENT: Patient presents to therapy with complaint of lumbar radiculopathy. This began insidiously about 6-7 months ago. She noticed a pain in RT leg when getting out of bed. This got progressively worse with time. She had MRI which showed stenosis and  anterolisthesis. She was given steroid injections which did help to some degree. Pain in leg is not as bad now, but still notes RT leg symptoms first thing in morning.   PERTINENT HISTORY:  NA  PAIN:  Are you having pain? Yes: NPRS scale: 6-7/10 Pain location: RT lateral thigh  Pain description: pressure Aggravating factors: mornings  Relieving factors: moving around, getting up and walking   PRECAUTIONS: None  WEIGHT BEARING RESTRICTIONS: No  FALLS:  Has patient fallen in last 6 months? No  LIVING ENVIRONMENT: Lives with: lives with their family and lives alone Lives in: House/apartment Stairs: No Has following equipment at home: Single point cane and None  OCCUPATION: Disabled  PLOF: Independent  PATIENT GOALS: Get better  NEXT MD VISIT:   OBJECTIVE:   DIAGNOSTIC FINDINGS:  IMPRESSION: 1. Lumbar spine degeneration especially affecting the facets at L3-4 and below, with L4-5 anterolisthesis. 2. L4-5 high-grade spinal stenosis in part due to a ligamentum flavum ganglion in the midline.  PATIENT SURVEYS:  FOTO 69% function   COGNITION: Overall cognitive status: Within functional limits for tasks assessed     SENSATION: WFL  POSTURE:  slouched seated posture  PALPATION: Mod TTP about bilat SI joints   LUMBAR ROM:   AROM eval  Flexion 25% limited  Extension 50% limited  Right lateral flexion WFL  Left lateral flexion 25% limited  Right rotation   Left rotation     (Blank rows = not tested)  LOWER EXTREMITY MMT:    MMT Right eval Left eval  Hip flexion 4 4  Hip extension 3- 3+  Hip abduction 4 4-  Hip adduction    Hip internal rotation    Hip external rotation    Knee flexion    Knee extension 5 5  Ankle dorsiflexion 5 5  Ankle plantarflexion    Ankle inversion    Ankle eversion     (Blank rows = not  tested)  LUMBAR SPECIAL TESTS:  Straight leg raise test: Negative  FUNCTIONAL TESTS:  5 times sit to stand: 13.39 sec   TODAY'S  TREATMENT:                                                                                                                              DATE:  04/18/22 Eval  Ab brace Glute set LTR clamshell  PATIENT EDUCATION:  Education details: on eval findings, POC and HEP  Person educated: Patient Education method: Explanation Education comprehension: verbalized understanding  HOME EXERCISE PROGRAM: Access Code: [email or web address] URL: [email or web address] Date: 04/18/2022 Prepared by: Josue Hector  Exercises - Supine Transversus Abdominis Bracing - Hands on Stomach  - 3 x daily - 7 x weekly - 1 sets - 10 reps - 5 second hold - Clamshell  - 3 x daily - 7 x weekly - 1-2 sets - 10 reps - Supine Lower Trunk Rotation  - 3 x daily - 7 x weekly - 1 sets - 10 reps - 5 sec hold - Supine Gluteal Sets  - 3 x daily - 7 x weekly - 1 sets - 10 reps - 5 sec hold  ASSESSMENT:  CLINICAL IMPRESSION: Patient is a 66 y.o. female who presents to physical therapy with complaint of LBP. Patient demonstrates muscle weakness, reduced ROM, and fascial restrictions which are likely contributing to symptoms of pain and are negatively impacting patient ability to perform ADLs and functional mobility tasks. Patient will benefit from skilled physical therapy services to address these deficits to reduce pain and improve level of function with ADLs and functional mobility tasks.   OBJECTIVE IMPAIRMENTS: decreased activity tolerance, decreased mobility, decreased ROM, decreased strength, increased fascial restrictions, impaired flexibility, improper body mechanics, postural dysfunction, and pain.   ACTIVITY LIMITATIONS: carrying, lifting, bending, sitting, standing, squatting, stairs, transfers, and locomotion level  PARTICIPATION LIMITATIONS: meal prep, cleaning, laundry, shopping, community activity, and yard work  PERSONAL FACTORS:  NA  are also affecting patient's functional outcome.   REHAB POTENTIAL:  Good  CLINICAL DECISION MAKING: Stable/uncomplicated  EVALUATION COMPLEXITY: Low   GOALS: SHORT TERM GOALS: Target date: 05/09/2022  Patient will be independent with initial HEP and self-management strategies to improve functional outcomes Baseline:  Goal status: INITIAL   LONG TERM GOALS: Target date: 05/30/2022  Patient will be independent with advanced HEP and self-management strategies to improve functional outcomes Baseline:  Goal status: INITIAL  2.  Patient will improve FOTO score to predicted value to indicate improvement in functional outcomes Baseline: 69% function Goal status: INITIAL  3.  Patient will report reduction of back pain to <3/10 for improved quality of life and ability to perform ADLs  Baseline: 6-7/10 Goal status: INITIAL  4. Patient will have equal to or > 4+/5 MMT throughout BLE to improve ability to perform functional mobility, stair ambulation and ADLs.  Baseline: See MMT Goal status: INITIAL   PLAN:  PT FREQUENCY: 1-2x/week  PT DURATION:  6 weeks  PLANNED INTERVENTIONS: Therapeutic exercises, Therapeutic activity, Neuromuscular re-education, Balance training, Gait training, Patient/Family education, Joint manipulation, Joint mobilization, Stair training, Aquatic Therapy, Dry Needling, Electrical stimulation, Spinal manipulation, Spinal mobilization, Cryotherapy, Moist heat, scar mobilization, Taping, Traction, Ultrasound, Biofeedback, Ionotophoresis '4mg'$ /ml Dexamethasone, and Manual therapy. Marland Kitchen  PLAN FOR NEXT SESSION: Progress glute and core strength as tolerated. Improve pain free lumbar ROM as able.   3:00 PM, 04/18/22 Josue Hector PT DPT  Physical Therapist with [facility]  [phone]

## 2022-04-20 ENCOUNTER — Ambulatory Visit (HOSPITAL_COMMUNITY): Payer: Medicare Other

## 2022-04-20 ENCOUNTER — Encounter (HOSPITAL_COMMUNITY): Payer: Self-pay

## 2022-04-20 DIAGNOSIS — M5416 Radiculopathy, lumbar region: Secondary | ICD-10-CM | POA: Diagnosis not present

## 2022-04-20 DIAGNOSIS — M5459 Other low back pain: Secondary | ICD-10-CM

## 2022-04-20 NOTE — Therapy (Signed)
OUTPATIENT PHYSICAL THERAPY THORACOLUMBAR TREATMENT   Patient Name: Shannon [name] MRN: [id number] DOB:December 15, 1955, 66 y.o., female Today's Date: 04/20/2022  END OF SESSION:  PT End of Session - 04/20/22 0902     Visit Number 2    Number of Visits 12    Date for PT Re-Evaluation 05/30/22    Authorization Type [employer]HC Medicare    Progress Note Due on Visit 10    PT Start Time 0904    PT Stop Time 0943    PT Time Calculation (min) 39 min    Activity Tolerance Patient tolerated treatment well    Behavior During Therapy [facility] for tasks assessed/performed             Past Medical History:  Diagnosis Date   Absolute anemia 11/10/2015   Anemia    Anemia of renal disease 11/10/2015   Asthma    has Albuterol inhaler prn   CHF (congestive heart failure) ([place])    acute systolic CHF 08/[facility])   Chronic kidney disease    Membranous nephropathy ( Se[facility] at [facility])   Endometrial adenocarcinoma ([place])    Gastric ulcer    History of blood transfusion    no abnormal reaction noted   History of bronchitis    last time 3-22yr ago   Hyperlipidemia    takes Pravastatin daily   Hypertension    takes Imdur and Hydralazine daily as well as Coreg   Hypothyroidism    takes Synthroid daily   Itching    on legs-has a Kenalog cream   Metabolic bone disease    Nausea    takes Zofran prn   Nonischemic cardiomyopathy ([place]    12/2011 normal coronaries, EF 22% (EF normal 04/2013)   Obesity    Peripheral edema    takes Torsemide daily   Thyroid disease    hypothyroidism   Past Surgical History:  Procedure Laterality Date   AV FISTULA PLACEMENT Left 07/22/2012   Procedure: INSERTION OF ARTERIOVENOUS GORE-TEX GRAFT ARM;  Surgeon: CAngelia Mould MD;  [place]  Service: Vascular;  Laterality: Left;   AV FISTULA PLACEMENT Right 05/24/2017   Procedure: INSERTION OF ARTERIOVENOUS (AV) GORE-TEX GRAFT RIGHT ARM;  Surgeon: DAngelia Mould MD;  [place]  Service:  Vascular;  Laterality: Right;   CESAREAN SECTION     30+yrs ago   COLONOSCOPY     DILATION AND CURETTAGE, DIAGNOSTIC / THERAPEUTIC     left arm surgery with pin     15+yrs ago   ROBOTIC ASSISTED TOTAL HYSTERECTOMY WITH BILATERAL SALPINGO OOPHERECTOMY Bilateral 09/24/2019   Procedure: XI ROBOTIC [place]  Surgeon: [name] MD;  [facility];  Service: Gynecology;  Laterality: Bilateral;   SENTINEL NODE BIOPSY N/A 09/24/2019   Procedure: SENTINEL NODE BIOPSY;  Surgeon: [name] MD;  [facility];  Service: Gynecology;  Laterality: N/A;   TUBAL LIGATION     Patient Active Problem List   Diagnosis Date Noted   CKD (chronic kidney disease), stage V ([facility] 08/20/2019   Endometrial/uterine adenocarcinoma ([place] 08/20/2019   Anemia associated with chronic renal failure 11/10/2015   Anemia of renal disease 11/10/2015   Cardiomyopathy ([place] 04/21/2013   HTN (hypertension) 04/21/2013   Hyperlipidemia 04/21/2013   Tobacco abuse 04/21/2013   End stage renal disease ([place] 08/06/2012   Chronic kidney disease, stage IV (severe) ([place] 07/09/2012    PCP: DJerene BearsMD  REFERRING PROVIDER: KEarlyne Iba  MD  REFERRING DIAG: lumbar radiculopathy, spinal stenosis w/neutogenic claudication  Rationale for Evaluation and Treatment: Rehabilitation  THERAPY DIAG:  Other low back pain  Radiculopathy, lumbar region  ONSET DATE: 6-7 months ago   SUBJECTIVE:                                                                                                                                                                                           SUBJECTIVE STATEMENT: Pt stated her back is feeling okay this morning reports her Lt knee has increased pain today, feels the weather may be related.   Eval subjective: Patient presents to therapy with complaint of lumbar radiculopathy. This began insidiously about 6-7 months  ago. She noticed a pain in RT leg when getting out of bed. This got progressively worse with time. She had MRI which showed stenosis and anterolisthesis. She was given steroid injections which did help to some degree. Pain in leg is not as bad now, but still notes RT leg symptoms first thing in morning.   PERTINENT HISTORY:  NA  PAIN:  Are you having pain? Yes: NPRS scale: eval range: 6-7 today: 0/10 Pain location: RT lateral thigh  Pain description: pressure Aggravating factors: mornings  Relieving factors: moving around, getting up and walking   PRECAUTIONS: None  WEIGHT BEARING RESTRICTIONS: No  FALLS:  Has patient fallen in last 6 months? No  LIVING ENVIRONMENT: Lives with: lives with their family and lives alone Lives in: House/apartment Stairs: No Has following equipment at home: Single point cane and None  OCCUPATION: Disabled  PLOF: Independent  PATIENT GOALS: Get better  NEXT MD VISIT:   OBJECTIVE:   DIAGNOSTIC FINDINGS:  IMPRESSION: 1. Lumbar spine degeneration especially affecting the facets at L3-4 and below, with L4-5 anterolisthesis. 2. L4-5 high-grade spinal stenosis in part due to a ligamentum flavum ganglion in the midline.  PATIENT SURVEYS:  FOTO 69% function   COGNITION: Overall cognitive status: Within functional limits for tasks assessed     SENSATION: WFL  POSTURE:  slouched seated posture  PALPATION: Mod TTP about bilat SI joints   LUMBAR ROM:   AROM eval  Flexion 25% limited  Extension 50% limited  Right lateral flexion WFL  Left lateral flexion 25% limited  Right rotation   Left rotation     (Blank rows = not tested)  LOWER EXTREMITY MMT:    MMT Right eval Left eval  Hip flexion 4 4  Hip extension 3- 3+  Hip abduction 4 4-  Hip adduction    Hip internal rotation    Hip external rotation    Knee flexion  Knee extension 5 5  Ankle dorsiflexion 5 5  Ankle plantarflexion    Ankle inversion    Ankle eversion      (Blank rows = not tested)  LUMBAR SPECIAL TESTS:  Straight leg raise test: Negative  FUNCTIONAL TESTS:  5 times sit to stand: 13.39 sec   TODAY'S TREATMENT:                                                                                                                              DATE:  04/20/22: Reviewed goals Reviewed current HEP Supine: Ab set paired with breathing LTR 5x 10" Glut set March with ab set 10x alternating Bridge 10x 5" Sidelying: clam shell- added GTB around thigh 04/18/22 Eval  Ab brace Glute set LTR clamshell  PATIENT EDUCATION:  Education details: on eval findings, POC and HEP  Person educated: Patient Education method: Explanation Education comprehension: verbalized understanding  HOME EXERCISE PROGRAM: Access Code: [email or web address] URL: [email or web address]  Date: 04/18/2022 Prepared by: Josue Hector  Exercises - Supine Transversus Abdominis Bracing - Hands on Stomach  - 3 x daily - 7 x weekly - 1 sets - 10 reps - 5 second hold - Clamshell  - 3 x daily - 7 x weekly - 1-2 sets - 10 reps - Supine Lower Trunk Rotation  - 3 x daily - 7 x weekly - 1 sets - 10 reps - 5 sec hold - Supine Gluteal Sets  - 3 x daily - 7 x weekly - 1 sets - 10 reps - 5 sec hold  04/20/22: Bridge and march with ab set  ASSESSMENT:  CLINICAL IMPRESSION: Reviewed goals, educated importance of HEP compliance, pt able to recall with some cueing for proper form with exercises and transversus abdominis activation paired with breathing  for core stability.  Session focus on core stability and proximal strengthening with additional exercises added to POC.  Pt tolerated well to session with no reports of increased pain with any exercise.   OBJECTIVE IMPAIRMENTS: decreased activity tolerance, decreased mobility, decreased ROM, decreased strength, increased fascial restrictions, impaired flexibility, improper body mechanics, postural dysfunction, and pain.    ACTIVITY LIMITATIONS: carrying, lifting, bending, sitting, standing, squatting, stairs, transfers, and locomotion level  PARTICIPATION LIMITATIONS: meal prep, cleaning, laundry, shopping, community activity, and yard work  PERSONAL FACTORS:  NA  are also affecting patient's functional outcome.   REHAB POTENTIAL: Good  CLINICAL DECISION MAKING: Stable/uncomplicated  EVALUATION COMPLEXITY: Low   GOALS: SHORT TERM GOALS: Target date: 05/09/2022  Patient will be independent with initial HEP and self-management strategies to improve functional outcomes Baseline:  Goal status: IN PROGRESS   LONG TERM GOALS: Target date: 05/30/2022  Patient will be independent with advanced HEP and self-management strategies to improve functional outcomes Baseline:  Goal status: IN PROGRESS  2.  Patient will improve FOTO score to predicted value to indicate improvement in functional outcomes Baseline: 69% function Goal status: IN PROGRESS  3.  Patient will  report reduction of back pain to <3/10 for improved quality of life and ability to perform ADLs  Baseline: 6-7/10 Goal status: IN PROGRESS  4. Patient will have equal to or > 4+/5 MMT throughout BLE to improve ability to perform functional mobility, stair ambulation and ADLs.  Baseline: See MMT Goal status: IN PROGRESS   PLAN:  PT FREQUENCY: 1-2x/week  PT DURATION: 6 weeks  PLANNED INTERVENTIONS: Therapeutic exercises, Therapeutic activity, Neuromuscular re-education, Balance training, Gait training, Patient/Family education, Joint manipulation, Joint mobilization, Stair training, Aquatic Therapy, Dry Needling, Electrical stimulation, Spinal manipulation, Spinal mobilization, Cryotherapy, Moist heat, scar mobilization, Taping, Traction, Ultrasound, Biofeedback, Ionotophoresis '4mg'$ /ml Dexamethasone, and Manual therapy. Marland Kitchen  PLAN FOR NEXT SESSION: Progress glute and core strength as tolerated. Improve pain free lumbar ROM as able.   Ihor Austin, LPTA/CLT; CBIS [phone]  10:05 AM, 04/20/22

## 2022-04-24 ENCOUNTER — Encounter (HOSPITAL_COMMUNITY): Payer: Self-pay

## 2022-04-24 ENCOUNTER — Ambulatory Visit (HOSPITAL_COMMUNITY): Payer: Medicare Other

## 2022-04-24 DIAGNOSIS — M5416 Radiculopathy, lumbar region: Secondary | ICD-10-CM | POA: Diagnosis not present

## 2022-04-24 DIAGNOSIS — M5459 Other low back pain: Secondary | ICD-10-CM

## 2022-04-24 NOTE — Therapy (Signed)
OUTPATIENT PHYSICAL THERAPY THORACOLUMBAR TREATMENT   Patient Name: Shannon [name] MRN: [id number] DOB:August 13, 1955, 65 y.o., female Today's Date: 04/24/2022  END OF SESSION:  PT End of Session - 04/24/22 1548     Visit Number 3    Number of Visits 12    Date for PT Re-Evaluation 05/30/22    Authorization Type [employer] Medicare    Progress Note Due on Visit 10    PT Start Time 1518    PT Stop Time 1558    PT Time Calculation (min) 40 min    Activity Tolerance Patient tolerated treatment well    Behavior During Therapy WFL for tasks assessed/performed              Past Medical History:  Diagnosis Date   Absolute anemia 11/10/2015   Anemia    Anemia of renal disease 11/10/2015   Asthma    has Albuterol inhaler prn   CHF (congestive heart failure) ([place])    acute systolic CHF 07/[facility])   Chronic kidney disease    Membranous nephropathy ( Seen at [facility])   Endometrial adenocarcinoma ([place])    Gastric ulcer    History of blood transfusion    no abnormal reaction noted   History of bronchitis    last time 3-27yr ago   Hyperlipidemia    takes Pravastatin daily   Hypertension    takes Imdur and Hydralazine daily as well as Coreg   Hypothyroidism    takes Synthroid daily   Itching    on legs-has a Kenalog cream   Metabolic bone disease    Nausea    takes Zofran prn   [place]ischemic [place] ([place]    12/2011 normal coronaries, EF 22% (EF normal 04/2013)   Obesity    Peripheral edema    takes Torsemide daily   Thyroid disease    hypothyroidism   Past Surgical History:  Procedure Laterality Date   AV FISTULA PLACEMENT Left 07/22/2012   Procedure: INSERTION OF ARTERIOVENOUS GORE-TEX GRAFT ARM;  Surgeon: CAngelia Mould MD;  [facility]  Service: Vascular;  Laterality: Left;   AV FISTULA PLACEMENT Right 05/24/2017   Procedure: INSERTION OF ARTERIOVENOUS (AV) GORE-TEX GRAFT RIGHT ARM;  Surgeon: DAngelia Mould MD;  [place] [facility]  Service:  Vascular;  Laterality: Right;   CESAREAN SECTION     30+yrs ago   COLONOSCOPY     DILATION AND CURETTAGE, DIAGNOSTIC / THERAPEUTIC     left arm surgery with pin     15+yrs ago   ROBOTIC ASSISTED TOTAL HYSTERECTOMY WITH BILATERAL SALPINGO OOPHERECTOMY Bilateral 09/24/2019   Procedure: XI ROBOTIC [place]  Surgeon: [name] MD;  [facility];  Service: Gynecology;  Laterality: Bilateral;   SENTINEL NODE BIOPSY N/A 09/24/2019   Procedure: SENTINEL NODE BIOPSY;  Surgeon: [name] MD;  [facility];  Service: Gynecology;  Laterality: N/A;   TUBAL LIGATION     Patient Active Problem List   Diagnosis Date Noted   CKD (chronic kidney disease), stage V ([place] 08/20/2019   Endometrial/uterine adenocarcinoma ([facility] 08/20/2019   Anemia associated with chronic renal failure 11/10/2015   Anemia of renal disease 11/10/2015   Cardiomyopathy ([place] 04/21/2013   HTN (hypertension) 04/21/2013   Hyperlipidemia 04/21/2013   Tobacco abuse 04/21/2013   End stage renal disease ([place] 08/06/2012   Chronic kidney disease, stage IV (severe) ([place] 07/09/2012    PCP: Shannon BearsMD  REFERRING PROVIDER: KMarylyn [name]  Shannon Ahmadi, MD  REFERRING DIAG: lumbar radiculopathy, spinal stenosis w/neutogenic claudication  Rationale for Evaluation and Treatment: Rehabilitation  THERAPY DIAG:  Other low back pain  Radiculopathy, lumbar region  ONSET DATE: 6-7 months ago   SUBJECTIVE:                                                                                                                                                                                           SUBJECTIVE STATEMENT: Pt stated she is feeling good today, no reports of pain currently but does radicular symptoms down Rt lateral LE ending at ankle.  Eval subjective: Patient presents to therapy with complaint of lumbar radiculopathy. This began insidiously about 6-7 months  ago. She noticed a pain in RT leg when getting out of bed. This got progressively worse with time. She had MRI which showed stenosis and anterolisthesis. She was given steroid injections which did help to some degree. Pain in leg is not as bad now, but still notes RT leg symptoms first thing in morning.   PERTINENT HISTORY:  NA  PAIN:  Are you having pain? Yes: NPRS scale: eval range: 6-7 today: 0/10 Pain location: RT lateral thigh  Pain description: pressure Aggravating factors: mornings  Relieving factors: moving around, getting up and walking   PRECAUTIONS: None  WEIGHT BEARING RESTRICTIONS: No  FALLS:  Has patient fallen in last 6 months? No  LIVING ENVIRONMENT: Lives with: lives with their family and lives alone Lives in: House/apartment Stairs: No Has following equipment at home: Single point cane and None  OCCUPATION: Disabled  PLOF: Independent  PATIENT GOALS: Get better  NEXT MD VISIT:   OBJECTIVE:   DIAGNOSTIC FINDINGS:  IMPRESSION: 1. Lumbar spine degeneration especially affecting the facets at L3-4 and below, with L4-5 anterolisthesis. 2. L4-5 high-grade spinal stenosis in part due to a ligamentum flavum ganglion in the midline.  PATIENT SURVEYS:  FOTO 69% function   COGNITION: Overall cognitive status: Within functional limits for tasks assessed     SENSATION: WFL  POSTURE:  slouched seated posture  PALPATION: Mod TTP about bilat SI joints   LUMBAR ROM:   AROM eval  Flexion 25% limited  Extension 50% limited  Right lateral flexion WFL  Left lateral flexion 25% limited  Right rotation   Left rotation     (Blank rows = not tested)  LOWER EXTREMITY MMT:    MMT Right eval Left eval  Hip flexion 4 4  Hip extension 3- 3+  Hip abduction 4 4-  Hip adduction    Hip internal rotation    Hip external rotation    Knee flexion  Knee extension 5 5  Ankle dorsiflexion 5 5  Ankle plantarflexion    Ankle inversion    Ankle eversion      (Blank rows = not tested)  LUMBAR SPECIAL TESTS:  Straight leg raise test: Negative  FUNCTIONAL TESTS:  5 times sit to stand: 13.39 sec   TODAY'S TREATMENT:                                                                                                                              DATE:  04/24/22: Prone: POE x 2 min Press up- 5x 5", limited by UE weakness Heel squeeze 5x 5" Hip extension 5x 3" holds Sidelying: hip abd Supine: abdominal sets Marching with ab set Bridge 10x STS no HHA eccentric control 10x   04/20/22: Reviewed goals Reviewed current HEP Supine: Ab set paired with breathing LTR 5x 10" Glut set March with ab set 10x alternating Bridge 10x 5" Sidelying: clam shell- added GTB around thigh 04/18/22 Eval  Ab brace Glute set LTR clamshell  PATIENT EDUCATION:  Education details: on eval findings, POC and HEP  Person educated: Patient Education method: Explanation Education comprehension: verbalized understanding  HOME EXERCISE PROGRAM: Access Code: [email or web address] URL: [email or web address]  Date: 04/18/2022 Prepared by: Josue Hector  Exercises - Supine Transversus Abdominis Bracing - Hands on Stomach  - 3 x daily - 7 x weekly - 1 sets - 10 reps - 5 second hold - Clamshell  - 3 x daily - 7 x weekly - 1-2 sets - 10 reps - Supine Lower Trunk Rotation  - 3 x daily - 7 x weekly - 1 sets - 10 reps - 5 sec hold - Supine Gluteal Sets  - 3 x daily - 7 x weekly - 1 sets - 10 reps - 5 sec hold  04/20/22: Bridge and march with ab set  04/24/22: POE Prone hip extension Sidelying hip abduction  ASSESSMENT:  CLINICAL IMPRESSION: Added prone exercises for lumbar mobility and gluteal strengthening, pt with positive results stating radicular symptoms resolved in POE, increased difficulty with press ups due to UE weakness.  Progressed gluteal strengthening with additional SLR with some difficulty noted due to weakness, no reports of increased pain.   Improved pairing with abdominal sets without holding breath.  EOS no reports of pain or radicular symptoms.     OBJECTIVE IMPAIRMENTS: decreased activity tolerance, decreased mobility, decreased ROM, decreased strength, increased fascial restrictions, impaired flexibility, improper body mechanics, postural dysfunction, and pain.   ACTIVITY LIMITATIONS: carrying, lifting, bending, sitting, standing, squatting, stairs, transfers, and locomotion level  PARTICIPATION LIMITATIONS: meal prep, cleaning, laundry, shopping, community activity, and yard work  PERSONAL FACTORS:  NA  are also affecting patient's functional outcome.   REHAB POTENTIAL: Good  CLINICAL DECISION MAKING: Stable/uncomplicated  EVALUATION COMPLEXITY: Low   GOALS: SHORT TERM GOALS: Target date: 05/09/2022  Patient will be independent with initial HEP and self-management strategies to improve functional outcomes Baseline:  Goal status: IN  PROGRESS   LONG TERM GOALS: Target date: 05/30/2022  Patient will be independent with advanced HEP and self-management strategies to improve functional outcomes Baseline:  Goal status: IN PROGRESS  2.  Patient will improve FOTO score to predicted value to indicate improvement in functional outcomes Baseline: 69% function Goal status: IN PROGRESS  3.  Patient will report reduction of back pain to <3/10 for improved quality of life and ability to perform ADLs  Baseline: 6-7/10 Goal status: IN PROGRESS  4. Patient will have equal to or > 4+/5 MMT throughout BLE to improve ability to perform functional mobility, stair ambulation and ADLs.  Baseline: See MMT Goal status: IN PROGRESS   PLAN:  PT FREQUENCY: 1-2x/week  PT DURATION: 6 weeks  PLANNED INTERVENTIONS: Therapeutic exercises, Therapeutic activity, Neuromuscular re-education, Balance training, Gait training, Patient/Family education, Joint manipulation, Joint mobilization, Stair training, Aquatic Therapy, Dry Needling,  Electrical stimulation, Spinal manipulation, Spinal mobilization, Cryotherapy, Moist heat, scar mobilization, Taping, Traction, Ultrasound, Biofeedback, Ionotophoresis '4mg'$ /ml Dexamethasone, and Manual therapy. Marland Kitchen  PLAN FOR NEXT SESSION: Progress glute and core strength as tolerated. Improve pain free lumbar ROM as able. Add standing lumbar extension and squats next session.  Progress core and postural strengthening.  Ihor Austin, LPTA/CLT; CBIS [phone]  3:59 PM, 04/24/22

## 2022-04-25 ENCOUNTER — Ambulatory Visit
Admission: RE | Admit: 2022-04-25 | Discharge: 2022-04-25 | Disposition: A | Discharge status: Home / Self Care | Payer: Medicare Other | Source: Ambulatory Visit | Attending: Internal Medicine | Admitting: Internal Medicine

## 2022-04-25 DIAGNOSIS — Z1231 Encounter for screening mammogram for malignant neoplasm of breast: Secondary | ICD-10-CM | POA: Diagnosis not present

## 2022-04-26 DIAGNOSIS — B353 Tinea pedis: Secondary | ICD-10-CM | POA: Diagnosis not present

## 2022-04-26 DIAGNOSIS — I10 Essential (primary) hypertension: Secondary | ICD-10-CM | POA: Diagnosis not present

## 2022-04-26 DIAGNOSIS — B070 Plantar wart: Secondary | ICD-10-CM | POA: Diagnosis not present

## 2022-04-26 DIAGNOSIS — Z299 Encounter for prophylactic measures, unspecified: Secondary | ICD-10-CM | POA: Diagnosis not present

## 2022-05-02 ENCOUNTER — Ambulatory Visit (HOSPITAL_COMMUNITY): Payer: Medicare Other | Admitting: Physical Therapy

## 2022-05-02 DIAGNOSIS — M5459 Other low back pain: Secondary | ICD-10-CM | POA: Diagnosis not present

## 2022-05-02 DIAGNOSIS — M5416 Radiculopathy, lumbar region: Secondary | ICD-10-CM

## 2022-05-02 NOTE — Therapy (Signed)
OUTPATIENT PHYSICAL THERAPY THORACOLUMBAR TREATMENT   Patient Name: Shannon [name] MRN: [id number] DOB:Dec 25, 1955, 66 y.o., female Today's Date: 04/24/2022  END OF SESSION:  PT End of Session - 04/24/22 1548     Visit Number 3    Number of Visits 12    Date for PT Re-Evaluation 05/30/22    Authorization Type UHC Medicare    Progress Note Due on Visit 10    PT Start Time 1518    PT Stop Time 1558    PT Time Calculation (min) 40 min    Activity Tolerance Patient tolerated treatment well    Behavior During Therapy WFL for tasks assessed/performed              Past Medical History:  Diagnosis Date   Absolute anemia 11/10/2015   Anemia    Anemia of renal disease 11/10/2015   Asthma    has Albuterol inhaler prn   CHF (congestive heart failure) ([place])    acute systolic CHF 07/[address] [facility])   Chronic kidney disease    Membranous nephropathy ( Seen at [facility])   Endometrial adenocarcinoma ([place])    Gastric ulcer    History of blood transfusion    no abnormal reaction noted   History of bronchitis    last time 3-46yr ago   Hyperlipidemia    takes Pravastatin daily   Hypertension    takes Imdur and Hydralazine daily as well as Coreg   Hypothyroidism    takes Synthroid daily   Itching    on legs-has a Kenalog cream   Metabolic bone disease    Nausea    takes Zofran prn   Nonischemic cardiomyopathy ([place]    12/2011 normal coronaries, EF 22% (EF normal 04/2013)   Obesity    Peripheral edema    takes Torsemide daily   Thyroid disease    hypothyroidism   Past Surgical History:  Procedure Laterality Date   AV FISTULA PLACEMENT Left 07/22/2012   Procedure: INSERTION OF ARTERIOVENOUS GORE-TEX GRAFT ARM;  Surgeon: CAngelia Mould MD;  [place]  Service: Vascular;  Laterality: Left;   AV FISTULA PLACEMENT Right 05/24/2017   Procedure: INSERTION OF ARTERIOVENOUS (AV) GORE-TEX GRAFT RIGHT ARM;  Surgeon: DAngelia Mould MD;  [place]  Service:  Vascular;  Laterality: Right;   CESAREAN SECTION     30+yrs ago   COLONOSCOPY     DILATION AND CURETTAGE, DIAGNOSTIC / THERAPEUTIC     left arm surgery with pin     15+yrs ago   ROBOTIC ASSISTED TOTAL HYSTERECTOMY WITH BILATERAL SALPINGO OOPHERECTOMY Bilateral 09/24/2019   Procedure: [place]OTIC [place]  Surgeon: [name] MD;  [facility];  Service: Gynecology;  Laterality: Bilateral;   SENTINEL NODE BIOPSY N/A 09/24/2019   Procedure: SENTINEL NODE BIOPSY;  Surgeon: [name] MD;  [facility];  Service: Gynecology;  Laterality: N/A;   TUBAL LIGATION     Patient Active Problem List   Diagnosis Date Noted   CKD (chronic kidney disease), stage V ([place] 08/20/2019   Endometrial/uterine adenocarcinoma ([facility] 08/20/2019   Anemia associated with chronic renal failure 11/10/2015   Anemia of renal disease 11/10/2015   Cardiomyopathy ([place] 04/21/2013   HTN (hypertension) 04/21/2013   Hyperlipidemia 04/21/2013   Tobacco abuse 04/21/2013   End stage renal disease ([place] 08/06/2012   Chronic kidney disease, stage IV (severe) ([facility] 07/09/2012    PCP: DJerene BearsMD  REFERRING PROVIDER: KMarylyn Ishihara  Felix Ahmadi, MD  REFERRING DIAG: lumbar radiculopathy, spinal stenosis w/neutogenic claudication  Rationale for Evaluation and Treatment: Rehabilitation  THERAPY DIAG:  Other low back pain  Radiculopathy, lumbar region  ONSET DATE: 6-7 months ago   SUBJECTIVE:                                                                                                                                                                                           SUBJECTIVE STATEMENT: Pt states it has not changed much.  Still having symptoms down Rt lateral LE ending at ankle, especially when she first gets up in the morning and it feels like "pressure".  Eval subjective: Patient presents to therapy with complaint of lumbar  radiculopathy. This began insidiously about 6-7 months ago. She noticed a pain in RT leg when getting out of bed. This got progressively worse with time. She had MRI which showed stenosis and anterolisthesis. She was given steroid injections which did help to some degree. Pain in leg is not as bad now, but still notes RT leg symptoms first thing in morning.   PERTINENT HISTORY:  NA  PAIN:  Are you having pain? Yes: NPRS scale: eval range: 6-7 today: 0/10 Pain location: RT lateral thigh  Pain description: pressure Aggravating factors: mornings  Relieving factors: moving around, getting up and walking   PRECAUTIONS: None  WEIGHT BEARING RESTRICTIONS: No  FALLS:  Has patient fallen in last 6 months? No  LIVING ENVIRONMENT: Lives with: lives with their family and lives alone Lives in: House/apartment Stairs: No Has following equipment at home: Single point cane and None  OCCUPATION: Disabled  PLOF: Independent  PATIENT GOALS: Get better  NEXT MD VISIT:   OBJECTIVE:   DIAGNOSTIC FINDINGS:  IMPRESSION: 1. Lumbar spine degeneration especially affecting the facets at L3-4 and below, with L4-5 anterolisthesis. 2. L4-5 high-grade spinal stenosis in part due to a ligamentum flavum ganglion in the midline.  PATIENT SURVEYS:  FOTO 69% function   COGNITION: Overall cognitive status: Within functional limits for tasks assessed     SENSATION: WFL  POSTURE:  slouched seated posture  PALPATION: Mod TTP about bilat SI joints   LUMBAR ROM:   AROM eval  Flexion 25% limited  Extension 50% limited  Right lateral flexion WFL  Left lateral flexion 25% limited  Right rotation   Left rotation     (Blank rows = not tested)  LOWER EXTREMITY MMT:    MMT Right eval Left eval  Hip flexion 4 4  Hip extension 3- 3+  Hip abduction 4 4-  Hip adduction    Hip internal rotation    Hip  external rotation    Knee flexion    Knee extension 5 5  Ankle dorsiflexion 5 5  Ankle  plantarflexion    Ankle inversion    Ankle eversion     (Blank rows = not tested)  LUMBAR SPECIAL TESTS:  Straight leg raise test: Negative  FUNCTIONAL TESTS:  5 times sit to stand: 13.39 sec   TODAY'S TREATMENT:                                                                                                                              DATE:  05/05/22 Supine: Bridge 10X  SLR 10X each  Ab sets 10X5"   March with ab set 10X  Lower trunk rotations 10X each Sidelying hip abduction 10X each Prone:  POE 2 minutes  Press ups 10X   Heelsqueezes 10X5"  SLR 10X STS no UE assist eccentric lowering 10X  Standing extensions 10X  Squats 10X  04/24/22: Prone: POE x 2 min Press up- 5x 5", limited by UE weakness Heel squeeze 5x 5" Hip extension 5x 3" holds Sidelying: hip abd Supine: abdominal sets Marching with ab set Bridge 10x STS no HHA eccentric control 10x   04/20/22: Reviewed goals Reviewed current HEP Supine: Ab set paired with breathing LTR 5x 10" Glut set March with ab set 10x alternating Bridge 10x 5" Sidelying: clam shell- added GTB around thigh 04/18/22 Eval  Ab brace Glute set LTR clamshell  PATIENT EDUCATION:  Education details: on eval findings, POC and HEP  Person educated: Patient Education method: Explanation Education comprehension: verbalized understanding  HOME EXERCISE PROGRAM: Access Code: [email or web address] URL: [email or web address]  Date: 04/18/2022 Prepared by: Josue Hector  Exercises - Supine Transversus Abdominis Bracing - Hands on Stomach  - 3 x daily - 7 x weekly - 1 sets - 10 reps - 5 second hold - Clamshell  - 3 x daily - 7 x weekly - 1-2 sets - 10 reps - Supine Lower Trunk Rotation  - 3 x daily - 7 x weekly - 1 sets - 10 reps - 5 sec hold - Supine Gluteal Sets  - 3 x daily - 7 x weekly - 1 sets - 10 reps - 5 sec hold  04/20/22: Bridge and march with ab set  04/24/22: POE Prone hip extension Sidelying hip  abduction  ASSESSMENT:  CLINICAL IMPRESSION: Continued with prone exercises for lumbar mobility and gluteal strengthening.  No symptom return with any exercise completed.  Cues for stabilization for prone and sidelying exercises.  Began standing lumbar extension and worked on form with squats.   PT with continued positive results with lumbar extension.   Pt will continue to benefit from skilled therapy to reduce symptoms and improve overall function.     OBJECTIVE IMPAIRMENTS: decreased activity tolerance, decreased mobility, decreased ROM, decreased strength, increased fascial restrictions, impaired flexibility, improper body mechanics, postural dysfunction, and pain.   ACTIVITY LIMITATIONS: carrying, lifting, bending, sitting, standing, squatting, stairs, transfers,  and locomotion level  PARTICIPATION LIMITATIONS: meal prep, cleaning, laundry, shopping, community activity, and yard work  PERSONAL FACTORS:  NA  are also affecting patient's functional outcome.   REHAB POTENTIAL: Good  CLINICAL DECISION MAKING: Stable/uncomplicated  EVALUATION COMPLEXITY: Low   GOALS: SHORT TERM GOALS: Target date: 05/09/2022  Patient will be independent with initial HEP and self-management strategies to improve functional outcomes Baseline:  Goal status: IN PROGRESS   LONG TERM GOALS: Target date: 05/30/2022  Patient will be independent with advanced HEP and self-management strategies to improve functional outcomes Baseline:  Goal status: IN PROGRESS  2.  Patient will improve FOTO score to predicted value to indicate improvement in functional outcomes Baseline: 69% function Goal status: IN PROGRESS  3.  Patient will report reduction of back pain to <3/10 for improved quality of life and ability to perform ADLs  Baseline: 6-7/10 Goal status: IN PROGRESS  4. Patient will have equal to or > 4+/5 MMT throughout BLE to improve ability to perform functional mobility, stair ambulation and ADLs.   Baseline: See MMT Goal status: IN PROGRESS   PLAN:  PT FREQUENCY: 1-2x/week  PT DURATION: 6 weeks  PLANNED INTERVENTIONS: Therapeutic exercises, Therapeutic activity, Neuromuscular re-education, Balance training, Gait training, Patient/Family education, Joint manipulation, Joint mobilization, Stair training, Aquatic Therapy, Dry Needling, Electrical stimulation, Spinal manipulation, Spinal mobilization, Cryotherapy, Moist heat, scar mobilization, Taping, Traction, Ultrasound, Biofeedback, Ionotophoresis '4mg'$ /ml Dexamethasone, and Manual therapy. Marland Kitchen  PLAN FOR NEXT SESSION: Progress glute and core strength as tolerated. Improve pain free lumbar ROM as able. Progress core and postural strengthening.  Teena Irani, PTA/CLT [place] Ph: [phone] 3:59 PM, 04/24/22

## 2022-05-16 ENCOUNTER — Ambulatory Visit (HOSPITAL_COMMUNITY): Payer: 59 | Attending: Pain Medicine | Admitting: Physical Therapy

## 2022-05-16 ENCOUNTER — Other Ambulatory Visit (HOSPITAL_COMMUNITY)
Admission: RE | Admit: 2022-05-16 | Discharge: 2022-05-16 | Disposition: A | Discharge status: Home / Self Care | Payer: Medicare Other | Source: Ambulatory Visit | Attending: Nephrology | Admitting: Nephrology

## 2022-05-16 DIAGNOSIS — M5416 Radiculopathy, lumbar region: Secondary | ICD-10-CM | POA: Diagnosis not present

## 2022-05-16 DIAGNOSIS — M5459 Other low back pain: Secondary | ICD-10-CM

## 2022-05-16 DIAGNOSIS — N189 Chronic kidney disease, unspecified: Secondary | ICD-10-CM | POA: Insufficient documentation

## 2022-05-16 DIAGNOSIS — D631 Anemia in chronic kidney disease: Secondary | ICD-10-CM | POA: Insufficient documentation

## 2022-05-16 DIAGNOSIS — I129 Hypertensive chronic kidney disease with stage 1 through stage 4 chronic kidney disease, or unspecified chronic kidney disease: Secondary | ICD-10-CM | POA: Insufficient documentation

## 2022-05-16 DIAGNOSIS — Z6841 Body Mass Index (BMI) 40.0 and over, adult: Secondary | ICD-10-CM | POA: Insufficient documentation

## 2022-05-16 DIAGNOSIS — N032 Chronic nephritic syndrome with diffuse membranous glomerulonephritis: Secondary | ICD-10-CM | POA: Insufficient documentation

## 2022-05-16 DIAGNOSIS — N185 Chronic kidney disease, stage 5: Secondary | ICD-10-CM | POA: Insufficient documentation

## 2022-05-16 LAB — CBC
HCT: 37.1 % (ref 36.0–46.0)
Hemoglobin: 11.6 g/dL — ABNORMAL LOW (ref 12.0–15.0)
MCH: 31.7 pg (ref 26.0–34.0)
MCHC: 31.3 g/dL (ref 30.0–36.0)
MCV: 101.4 fL — ABNORMAL HIGH (ref 80.0–100.0)
Platelets: 241 10*3/uL (ref 150–400)
RBC: 3.66 MIL/uL — ABNORMAL LOW (ref 3.87–5.11)
RDW: 13.2 % (ref 11.5–15.5)
WBC: 8.4 10*3/uL (ref 4.0–10.5)
nRBC: 0 % (ref 0.0–0.2)

## 2022-05-16 LAB — PROTEIN / CREATININE RATIO, URINE
Creatinine, Urine: 87.47 mg/dL
Protein Creatinine Ratio: 0.66 mg/mg{Cre} — ABNORMAL HIGH (ref 0.00–0.15)
Total Protein, Urine: 58 mg/dL

## 2022-05-16 LAB — FERRITIN: Ferritin: 243 ng/mL (ref 11–307)

## 2022-05-16 LAB — RENAL FUNCTION PANEL
Albumin: 3.5 g/dL (ref 3.5–5.0)
Anion gap: 8 (ref 5–15)
BUN: 41 mg/dL — ABNORMAL HIGH (ref 8–23)
CO2: 21 mmol/L — ABNORMAL LOW (ref 22–32)
Calcium: 8.8 mg/dL — ABNORMAL LOW (ref 8.9–10.3)
Chloride: 110 mmol/L (ref 98–111)
Creatinine, Ser: 3.81 mg/dL — ABNORMAL HIGH (ref 0.44–1.00)
GFR, Estimated: 12 mL/min — ABNORMAL LOW (ref >60)
Glucose, Bld: 110 mg/dL — ABNORMAL HIGH (ref 70–99)
Phosphorus: 3.9 mg/dL (ref 2.5–4.6)
Potassium: 3.9 mmol/L (ref 3.5–5.1)
Sodium: 139 mmol/L (ref 135–145)

## 2022-05-16 LAB — IRON AND TIBC
Iron: 42 ug/dL (ref 28–170)
Saturation Ratios: 16 % (ref 10.4–31.8)
TIBC: 269 ug/dL (ref 250–450)
UIBC: 227 ug/dL

## 2022-05-16 NOTE — Therapy (Signed)
OUTPATIENT PHYSICAL THERAPY THORACOLUMBAR TREATMENT   Patient Name: Shannon [name] MRN: [id number] DOB:November 14, 1955, 67 y.o., female Today's Date: 05/16/2022  END OF SESSION:  PT End of Session - 05/16/22 1439     Visit Number 5    Number of Visits 12    Date for PT Re-Evaluation 05/30/22    Authorization Type U[employer] Medicare    Progress Note Due on Visit 10    PT Start Time 1435    PT Stop Time 1514    PT Time Calculation (min) 39 min    Activity Tolerance Patient tolerated treatment well    Behavior During Therapy WFL for tasks assessed/performed              Past Medical History:  Diagnosis Date   Absolute anemia 11/10/2015   Anemia    Anemia of renal disease 11/10/2015   Asthma    has Albuterol inhaler prn   CHF (congestive heart failure) ([place])    acute systolic CHF 11/3218 [facility])   Chronic kidney disease    Membranous nephropathy ( Seen at [facility])   Endometrial adenocarcinoma ([place])    Gastric ulcer    History of blood transfusion    no abnormal reaction noted   History of bronchitis    last time 3-55yr ago   Hyperlipidemia    takes Pravastatin daily   Hypertension    takes Imdur and Hydralazine daily as well as Coreg   Hypothyroidism    takes Synthroid daily   Itching    on legs-has a Kenalog cream   Metabolic bone disease    Nausea    takes Zofran prn   Nonischemic cardiomyopathy ([place]    12/2011 normal coronaries, EF 22% (EF normal 04/2013)   Obesity    Peripheral edema    takes Torsemide daily   Thyroid disease    hypothyroidism   Past Surgical History:  Procedure Laterality Date   AV FISTULA PLACEMENT Left 07/22/2012   Procedure: INSERTION OF ARTERIOVENOUS GORE-TEX GRAFT ARM;  Surgeon: CAngelia Mould MD;  [place]  Service: Vascular;  Laterality: Left;   AV FISTULA PLACEMENT Right 05/24/2017   Procedure: INSERTION OF ARTERIOVENOUS (AV) GORE-TEX GRAFT RIGHT ARM;  Surgeon: DAngelia Mould MD;  [place] [facility]  Service:  Vascular;  Laterality: Right;   CESAREAN SECTION     30+yrs ago   COLONOSCOPY     DILATION AND CURETTAGE, DIAGNOSTIC / THERAPEUTIC     left arm surgery with pin     15+yrs ago   ROBOTIC ASSISTED TOTAL HYSTERECTOMY WITH BILATERAL SALPINGO OOPHERECTOMY Bilateral 09/24/2019   Procedure: XI ROBOTIC AConstantine  Surgeon: [name] MD;  [facility];  Service: Gynecology;  Laterality: Bilateral;   SENTINEL NODE BIOPSY N/A 09/24/2019   Procedure: SENTINEL NODE BIOPSY;  Surgeon: [name] MD;  [facility];  Service: Gynecology;  Laterality: N/A;   TUBAL LIGATION     Patient Active Problem List   Diagnosis Date Noted   CKD (chronic kidney disease), stage V ([place] 08/20/2019   Endometrial/uterine adenocarcinoma ([place] 08/20/2019   Anemia associated with chronic renal failure 11/10/2015   Anemia of renal disease 11/10/2015   Cardiomyopathy ([place] 04/21/2013   HTN (hypertension) 04/21/2013   Hyperlipidemia 04/21/2013   Tobacco abuse 04/21/2013   End stage renal disease ([place] 08/06/2012   Chronic kidney disease, stage IV (severe) ([place] 07/09/2012    PCP: DJerene BearsMD  REFERRING PROVIDER: KMarylyn Ishihara  Felix Ahmadi, MD  REFERRING DIAG: lumbar radiculopathy, spinal stenosis w/neutogenic claudication  Rationale for Evaluation and Treatment: Rehabilitation  THERAPY DIAG:  Other low back pain  Radiculopathy, lumbar region  ONSET DATE: 6-7 months ago   SUBJECTIVE:                                                                                                                                                                                           SUBJECTIVE STATEMENT: Back is "maybe some better". Trying to do HEP as she can. Had a good holiday, no notable flare ups of back pain. Still worst in mornings.   Eval subjective: Patient presents to therapy with complaint of lumbar radiculopathy. This began insidiously about 6-7  months ago. She noticed a pain in RT leg when getting out of bed. This got progressively worse with time. She had MRI which showed stenosis and anterolisthesis. She was given steroid injections which did help to some degree. Pain in leg is not as bad now, but still notes RT leg symptoms first thing in morning.   PERTINENT HISTORY:  NA  PAIN:  Are you having pain? Yes: NPRS scale: 1-2/10 Pain location: RT lateral thigh  Pain description: pressure Aggravating factors: mornings  Relieving factors: moving around, getting up and walking   PRECAUTIONS: None  WEIGHT BEARING RESTRICTIONS: No  FALLS:  Has patient fallen in last 6 months? No  LIVING ENVIRONMENT: Lives with: lives with their family and lives alone Lives in: House/apartment Stairs: No Has following equipment at home: Single point cane and None  OCCUPATION: Disabled  PLOF: Independent  PATIENT GOALS: Get better  NEXT MD VISIT:   OBJECTIVE:   DIAGNOSTIC FINDINGS:  IMPRESSION: 1. Lumbar spine degeneration especially affecting the facets at L3-4 and below, with L4-5 anterolisthesis. 2. L4-5 high-grade spinal stenosis in part due to a ligamentum flavum ganglion in the midline.  PATIENT SURVEYS:  FOTO 69% function   COGNITION: Overall cognitive status: Within functional limits for tasks assessed     SENSATION: WFL  POSTURE:  slouched seated posture  PALPATION: Mod TTP about bilat SI joints   LUMBAR ROM:   AROM eval  Flexion 25% limited  Extension 50% limited  Right lateral flexion WFL  Left lateral flexion 25% limited  Right rotation   Left rotation     (Blank rows = not tested)  LOWER EXTREMITY MMT:    MMT Right eval Left eval  Hip flexion 4 4  Hip extension 3- 3+  Hip abduction 4 4-  Hip adduction    Hip internal rotation    Hip external rotation    Knee flexion  Knee extension 5 5  Ankle dorsiflexion 5 5  Ankle plantarflexion    Ankle inversion    Ankle eversion     (Blank rows  = not tested)  LUMBAR SPECIAL TESTS:  Straight leg raise test: Negative  FUNCTIONAL TESTS:  5 times sit to stand: 13.39 sec   TODAY'S TREATMENT:                                                                                                                              DATE:  05/16/22 Ab brace 10 x 5" Ab march x20 SLR with ab march 2 x10  Bridge 2 x 10 Piriformis stretch 3 x 30"   Sidelying hip abduction 2 x 10 each  Eccentric STS x 10 Standing hip abduction/ extension RTB 2 x 10 each    05/05/22 Supine: Bridge 10X  SLR 10X each  Ab sets 10X5"   March with ab set 10X  Lower trunk rotations 10X each Sidelying hip abduction 10X each Prone:  POE 2 minutes  Press ups 10X   Heelsqueezes 10X5"  SLR 10X STS no UE assist eccentric lowering 10X  Standing extensions 10X  Squats 10X  04/24/22: Prone: POE x 2 min Press up- 5x 5", limited by UE weakness Heel squeeze 5x 5" Hip extension 5x 3" holds Sidelying: hip abd Supine: abdominal sets Marching with ab set Bridge 10x STS no HHA eccentric control 10x   PATIENT EDUCATION:  Education details: on eval findings, POC and HEP  Person educated: Patient Education method: Explanation Education comprehension: verbalized understanding  HOME EXERCISE PROGRAM: Access Code: [email or web address] URL: [email or web address] 05/[email or web address]/22 Access Code: [email or web address] URL: [email or web address] Date: 05/16/2022 Prepared by: Josue Hector  Exercises - Prone on Elbows Stretch  - 2 x daily - 7 x weekly - 1 sets - 2 reps - 2' hold - Supine Transversus Abdominis Bracing - Hands on Stomach  - 2 x daily - 7 x weekly - 1 sets - 10 reps - 5 second hold - Supine March  - 2 x daily - 7 x weekly - 2 sets - 10 reps - 5" hold - Supine Lower Trunk Rotation  - 2 x daily - 7 x weekly - 1 sets - 10 reps - 5 sec hold - Supine Piriformis Stretch with Foot on Ground  - 2 x daily - 7 x weekly - 1 sets - 3 reps - 30 second hold - Supine Bridge  -  2 x daily - 7 x weekly - 2 sets - 10 reps - 5" hold - Sidelying Hip Abduction  - 2 x daily - 7 x weekly - 2 sets - 10 reps - 5" hold - Prone Hip Extension  - 2 x daily - 7 x weekly - 2 sets - 10 reps - 5" hold - Hip Abduction with Resistance Loop  - 2 x daily - 7 x weekly - 2 sets - 10 reps - Hip Extension with Resistance Loop  -  2 x daily - 7 x weekly - 2 sets - 10 reps  Date: 04/18/2022 Prepared by: Josue Hector  Exercises - Supine Transversus Abdominis Bracing - Hands on Stomach  - 3 x daily - 7 x weekly - 1 sets - 10 reps - 5 second hold - Clamshell  - 3 x daily - 7 x weekly - 1-2 sets - 10 reps - Supine Lower Trunk Rotation  - 3 x daily - 7 x weekly - 1 sets - 10 reps - 5 sec hold - Supine Gluteal Sets  - 3 x daily - 7 x weekly - 1 sets - 10 reps - 5 sec hold  04/20/22: Bridge and march with ab set  04/24/22: POE Prone hip extension Sidelying hip abduction  ASSESSMENT:  CLINICAL IMPRESSION: Patient tolerated session well today and was well challenged with additional there ex. Patient required verbal cues for bracing core during SLR and ab marching. Patient did note some discomfort in knees and has audible crepitus with eccentric sit to stands. Educated patient on hip and spine anatomy and on contribution of strengthening these target areas for improved lumbar stability to reduce back pain. Updated HEP and issued handout. Patient will continue to benefit from skilled therapy services to reduce remaining deficits and improve functional ability.    OBJECTIVE IMPAIRMENTS: decreased activity tolerance, decreased mobility, decreased ROM, decreased strength, increased fascial restrictions, impaired flexibility, improper body mechanics, postural dysfunction, and pain.   ACTIVITY LIMITATIONS: carrying, lifting, bending, sitting, standing, squatting, stairs, transfers, and locomotion level  PARTICIPATION LIMITATIONS: meal prep, cleaning, laundry, shopping, community activity, and yard  work  PERSONAL FACTORS:  NA  are also affecting patient's functional outcome.   REHAB POTENTIAL: Good  CLINICAL DECISION MAKING: Stable/uncomplicated  EVALUATION COMPLEXITY: Low   GOALS: SHORT TERM GOALS: Target date: 05/09/2022  Patient will be independent with initial HEP and self-management strategies to improve functional outcomes Baseline:  Goal status: IN PROGRESS   LONG TERM GOALS: Target date: 05/30/2022  Patient will be independent with advanced HEP and self-management strategies to improve functional outcomes Baseline:  Goal status: IN PROGRESS  2.  Patient will improve FOTO score to predicted value to indicate improvement in functional outcomes Baseline: 69% function Goal status: IN PROGRESS  3.  Patient will report reduction of back pain to <3/10 for improved quality of life and ability to perform ADLs  Baseline: 6-7/10 Goal status: IN PROGRESS  4. Patient will have equal to or > 4+/5 MMT throughout BLE to improve ability to perform functional mobility, stair ambulation and ADLs.  Baseline: See MMT Goal status: IN PROGRESS   PLAN:  PT FREQUENCY: 1-2x/week  PT DURATION: 6 weeks  PLANNED INTERVENTIONS: Therapeutic exercises, Therapeutic activity, Neuromuscular re-education, Balance training, Gait training, Patient/Family education, Joint manipulation, Joint mobilization, Stair training, Aquatic Therapy, Dry Needling, Electrical stimulation, Spinal manipulation, Spinal mobilization, Cryotherapy, Moist heat, scar mobilization, Taping, Traction, Ultrasound, Biofeedback, Ionotophoresis '4mg'$ /ml Dexamethasone, and Manual therapy. Marland Kitchen  PLAN FOR NEXT SESSION: Progress glute and core strength as tolerated. Improve pain free lumbar ROM as able. Progress core and postural strengthening.  2:40 PM, 05/16/22 Josue Hector PT DPT  Physical Therapist with [facility]  [phone]

## 2022-05-23 ENCOUNTER — Ambulatory Visit (HOSPITAL_COMMUNITY): Payer: 59 | Admitting: Physical Therapy

## 2022-05-23 DIAGNOSIS — M5416 Radiculopathy, lumbar region: Secondary | ICD-10-CM | POA: Diagnosis not present

## 2022-05-23 DIAGNOSIS — M5459 Other low back pain: Secondary | ICD-10-CM | POA: Diagnosis not present

## 2022-05-23 NOTE — Therapy (Signed)
OUTPATIENT PHYSICAL THERAPY THORACOLUMBAR TREATMENT   Patient Name: Shannon [name] MRN: [id number] DOB:Jun 17, 1955, 67 y.o., female Today's Date: 05/23/2022  Progress Note Reporting Period 04/18/22 to 05/23/22  See note below for Objective Data and Assessment of Progress/Goals.     END OF SESSION:  PT End of Session - 05/23/22 1116     Visit Number 6    Number of Visits 12    Date for PT Re-Evaluation 06/20/22    Authorization Type UHC Medicare    Progress Note Due on Visit 12    PT Start Time 1115    PT Stop Time 1155    PT Time Calculation (min) 40 min    Activity Tolerance Patient tolerated treatment well    Behavior During Therapy WFL for tasks assessed/performed              Past Medical History:  Diagnosis Date   Absolute anemia 11/10/2015   Anemia    Anemia of renal disease 11/10/2015   Asthma    has Albuterol inhaler prn   CHF (congestive heart failure) ([place])    acute systolic CHF 12/1446 [facility])   Chronic kidney disease    Membranous nephropathy ( Seen at [facility])   Endometrial adenocarcinoma ([place])    Gastric ulcer    History of blood transfusion    no abnormal reaction noted   History of bronchitis    last time 3-30yr ago   Hyperlipidemia    takes Pravastatin daily   Hypertension    takes Imdur and Hydralazine daily as well as Coreg   Hypothyroidism    takes Synthroid daily   Itching    on legs-has a Kenalog cream   Metabolic bone disease    Nausea    takes Zofran prn   Nonischemic cardiomyopathy ([place]    12/2011 normal coronaries, EF 22% (EF normal 04/2013)   Obesity    Peripheral edema    takes Torsemide daily   Thyroid disease    hypothyroidism   Past Surgical History:  Procedure Laterality Date   AV FISTULA PLACEMENT Left 07/22/2012   Procedure: INSERTION OF ARTERIOVENOUS GORE-TEX GRAFT ARM;  Surgeon: CAngelia Mould MD;  [place] [facility]  Service: Vascular;  Laterality: Left;   AV FISTULA PLACEMENT Right 05/24/2017    Procedure: INSERTION OF ARTERIOVENOUS (AV) GORE-TEX GRAFT RIGHT ARM;  Surgeon: DAngelia Mould MD;  [place]  Service: Vascular;  Laterality: Right;   CESAREAN SECTION     30+yrs ago   COLONOSCOPY     DILATION AND CURETTAGE, DIAGNOSTIC / THERAPEUTIC     left arm surgery with pin     15+yrs ago   ROBOTIC ASSISTED TOTAL HYSTERECTOMY WITH BILATERAL SALPINGO OOPHERECTOMY Bilateral 09/24/2019   Procedure: XI ROBOTIC ACoggon  Surgeon: [name] MD;  [facility];  Service: Gynecology;  Laterality: Bilateral;   SENTINEL NODE BIOPSY N/A 09/24/2019   Procedure: SENTINEL NODE BIOPSY;  Surgeon: [name] MD;  [facility];  Service: Gynecology;  Laterality: N/A;   TUBAL LIGATION     Patient Active Problem List   Diagnosis Date Noted   CKD (chronic kidney disease), stage V ([place] 08/20/2019   Endometrial/uterine adenocarcinoma ([place] 08/20/2019   Anemia associated with chronic renal failure 11/10/2015   Anemia of renal disease 11/10/2015   Cardiomyopathy ([place] 04/21/2013   HTN (hypertension) 04/21/2013   Hyperlipidemia 04/21/2013   Tobacco abuse 04/21/2013   End stage renal disease ([place]  08/06/2012   Chronic kidney disease, stage IV (severe) ([place]) 07/09/2012    PCP: Jerene Bears MD  REFERRING PROVIDER: Earlyne Iba, MD  REFERRING DIAG: lumbar radiculopathy, spinal stenosis w/neutogenic claudication  Rationale for Evaluation and Treatment: Rehabilitation  THERAPY DIAG:  Other low back pain - Plan: PT plan of care cert/re-cert  ONSET DATE: 6-7 months ago   SUBJECTIVE:                                                                                                                                                                                           SUBJECTIVE STATEMENT: "Nothing really change". Back still most stiff in morning but doesn't last long. Once she is up and moving around she  feels alright. No pain currently.   Eval subjective: Patient presents to therapy with complaint of lumbar radiculopathy. This began insidiously about 6-7 months ago. She noticed a pain in RT leg when getting out of bed. This got progressively worse with time. She had MRI which showed stenosis and anterolisthesis. She was given steroid injections which did help to some degree. Pain in leg is not as bad now, but still notes RT leg symptoms first thing in morning.   PERTINENT HISTORY:  NA  PAIN:  Are you having pain? Yes: NPRS scale: 0/10 Pain location: RT lateral thigh  Pain description: pressure Aggravating factors: mornings  Relieving factors: moving around, getting up and walking   PRECAUTIONS: None  WEIGHT BEARING RESTRICTIONS: No  FALLS:  Has patient fallen in last 6 months? No  LIVING ENVIRONMENT: Lives with: lives with their family and lives alone Lives in: House/apartment Stairs: No Has following equipment at home: Single point cane and None  OCCUPATION: Disabled  PLOF: Independent  PATIENT GOALS: Get better  NEXT MD VISIT:   OBJECTIVE:   DIAGNOSTIC FINDINGS:  IMPRESSION: 1. Lumbar spine degeneration especially affecting the facets at L3-4 and below, with L4-5 anterolisthesis. 2. L4-5 high-grade spinal stenosis in part due to a ligamentum flavum ganglion in the midline.  PATIENT SURVEYS:  FOTO 75% function (was 69% function)   COGNITION: Overall cognitive status: Within functional limits for tasks assessed     SENSATION: WFL  POSTURE:  slouched seated posture  PALPATION: Mod TTP about bilat SI joints   LUMBAR ROM:   AROM eval 05/23/22  Flexion 25% limited WFL  Extension 50% limited 50% limited  Right lateral flexion [facility] WFL  Left lateral flexion 25% limited WFL  Right rotation    Left rotation      (Blank rows = not tested)  LOWER EXTREMITY MMT:    MMT Right eval Left eval Right  05/23/22 Left  05/23/22  Hip flexion '4 4 5 5  '$ Hip  extension 3- 3+ 3+ 4-  Hip abduction 4 4- 4+ 4+  Hip adduction      Hip internal rotation      Hip external rotation      Knee flexion      Knee extension '5 5 5 5  '$ Ankle dorsiflexion '5 5 5 5  '$ Ankle plantarflexion      Ankle inversion      Ankle eversion       (Blank rows = not tested)  LUMBAR SPECIAL TESTS:  Straight leg raise test: Negative  FUNCTIONAL TESTS:  5 times sit to stand: 12.5 sec with hands on thighs (was 13.39 sec)   TODAY'S TREATMENT:                                                                                                                              DATE:  05/23/22 Reassess FOTO MMT AROM STS x 5   POE 2 min  Bridge 10 x 5" SLR 2 x 10   Standing heel raise 2 x 10 Mini squat 2 x 10 Standing hip abduction/ extension 2 x 10 each   05/16/22 Ab brace 10 x 5" Ab march x20 SLR with ab march 2 x10  Bridge 2 x 10 Piriformis stretch 3 x 30"   Sidelying hip abduction 2 x 10 each  Eccentric STS x 10 Standing hip abduction/ extension RTB 2 x 10 each    05/05/22 Supine: Bridge 10X  SLR 10X each  Ab sets 10X5"   March with ab set 10X  Lower trunk rotations 10X each Sidelying hip abduction 10X each Prone:  POE 2 minutes  Press ups 10X   Heelsqueezes 10X5"  SLR 10X STS no UE assist eccentric lowering 10X  Standing extensions 10X  Squats 10X   PATIENT EDUCATION:  Education details: on eval findings, POC and HEP  Person educated: Patient Education method: Explanation Education comprehension: verbalized understanding  HOME EXERCISE PROGRAM: Access Code: [email or web address] URL: [email or web address] 05/16/22 Access Code: [email or web address] URL: [email or web address] Date: 05/16/2022 Prepared by: Josue Hector  Exercises - Prone on Elbows Stretch  - 2 x daily - 7 x weekly - 1 sets - 2 reps - 2' hold - Supine Transversus Abdominis Bracing - Hands on Stomach  - 2 x daily - 7 x weekly - 1 sets - 10 reps - 5 second hold - Supine March   - 2 x daily - 7 x weekly - 2 sets - 10 reps - 5" hold - Supine Lower Trunk Rotation  - 2 x daily - 7 x weekly - 1 sets - 10 reps - 5 sec hold - Supine Piriformis Stretch with Foot on Ground  - 2 x daily - 7 x weekly - 1 sets - 3 reps - 30 second hold - Supine Bridge  - 2 x daily - 7 x weekly - 2 sets -  10 reps - 5" hold - Sidelying Hip Abduction  - 2 x daily - 7 x weekly - 2 sets - 10 reps - 5" hold - Prone Hip Extension  - 2 x daily - 7 x weekly - 2 sets - 10 reps - 5" hold - Hip Abduction with Resistance Loop  - 2 x daily - 7 x weekly - 2 sets - 10 reps - Hip Extension with Resistance Loop  - 2 x daily - 7 x weekly - 2 sets - 10 reps  Date: 04/18/2022 Prepared by: Josue Hector  Exercises - Supine Transversus Abdominis Bracing - Hands on Stomach  - 3 x daily - 7 x weekly - 1 sets - 10 reps - 5 second hold - Clamshell  - 3 x daily - 7 x weekly - 1-2 sets - 10 reps - Supine Lower Trunk Rotation  - 3 x daily - 7 x weekly - 1 sets - 10 reps - 5 sec hold - Supine Gluteal Sets  - 3 x daily - 7 x weekly - 1 sets - 10 reps - 5 sec hold  04/20/22: Bridge and march with ab set  04/24/22: POE Prone hip extension Sidelying hip abduction  ASSESSMENT:  CLINICAL IMPRESSION: Patient showing steady progress to therapy goals. Showing improved lumbar ROM as well as LE strength. Still limited by decreased glute strength/ activation, which continues to contribute to back pain and LE symptoms and negatively impact functional ability. Patient will continue to benefit from skilled therapy services to address these remaining deficits to reduce pain and improve LOF with ADLs.   OBJECTIVE IMPAIRMENTS: decreased activity tolerance, decreased mobility, decreased ROM, decreased strength, increased fascial restrictions, impaired flexibility, improper body mechanics, postural dysfunction, and pain.   ACTIVITY LIMITATIONS: carrying, lifting, bending, sitting, standing, squatting, stairs, transfers, and  locomotion level  PARTICIPATION LIMITATIONS: meal prep, cleaning, laundry, shopping, community activity, and yard work  PERSONAL FACTORS:  NA  are also affecting patient's functional outcome.   REHAB POTENTIAL: Good  CLINICAL DECISION MAKING: Stable/uncomplicated  EVALUATION COMPLEXITY: Low   GOALS: SHORT TERM GOALS: Target date: 05/09/2022  Patient will be independent with initial HEP and self-management strategies to improve functional outcomes Baseline:  Goal status: MET   LONG TERM GOALS: Target date: 05/30/2022  Patient will be independent with advanced HEP and self-management strategies to improve functional outcomes Baseline:  Goal status: IN PROGRESS  2.  Patient will improve FOTO score to predicted value to indicate improvement in functional outcomes Baseline: 75% function Goal status: MET  3.  Patient will report reduction of back pain to <3/10 for improved quality of life and ability to perform ADLs  Baseline: 0/10 Goal status: MET  4. Patient will have equal to or > 4+/5 MMT throughout BLE to improve ability to perform functional mobility, stair ambulation and ADLs.  Baseline: See MMT Goal status: Partially MET   PLAN:  PT FREQUENCY: 1x/week  PT DURATION: 4 weeks  PLANNED INTERVENTIONS: Therapeutic exercises, Therapeutic activity, Neuromuscular re-education, Balance training, Gait training, Patient/Family education, Joint manipulation, Joint mobilization, Stair training, Aquatic Therapy, Dry Needling, Electrical stimulation, Spinal manipulation, Spinal mobilization, Cryotherapy, Moist heat, scar mobilization, Taping, Traction, Ultrasound, Biofeedback, Ionotophoresis '4mg'$ /ml Dexamethasone, and Manual therapy. Marland Kitchen  PLAN FOR NEXT SESSION: Progress glute and core strength as tolerated. Improve pain free lumbar ROM as able. Progress core and postural strengthening.  11:54 AM, 05/23/22 Josue Hector PT DPT  Physical Therapist with [facility]  [phone]  [phone] 

## 2022-05-24 DIAGNOSIS — N189 Chronic kidney disease, unspecified: Secondary | ICD-10-CM | POA: Diagnosis not present

## 2022-05-24 DIAGNOSIS — D631 Anemia in chronic kidney disease: Secondary | ICD-10-CM | POA: Diagnosis not present

## 2022-05-24 DIAGNOSIS — E211 Secondary hyperparathyroidism, not elsewhere classified: Secondary | ICD-10-CM | POA: Diagnosis not present

## 2022-05-24 DIAGNOSIS — E8722 Chronic metabolic acidosis: Secondary | ICD-10-CM | POA: Diagnosis not present

## 2022-05-24 DIAGNOSIS — R809 Proteinuria, unspecified: Secondary | ICD-10-CM | POA: Diagnosis not present

## 2022-05-24 DIAGNOSIS — N185 Chronic kidney disease, stage 5: Secondary | ICD-10-CM | POA: Diagnosis not present

## 2022-05-24 DIAGNOSIS — N032 Chronic nephritic syndrome with diffuse membranous glomerulonephritis: Secondary | ICD-10-CM | POA: Diagnosis not present

## 2022-05-24 DIAGNOSIS — I129 Hypertensive chronic kidney disease with stage 1 through stage 4 chronic kidney disease, or unspecified chronic kidney disease: Secondary | ICD-10-CM | POA: Diagnosis not present

## 2022-05-29 ENCOUNTER — Ambulatory Visit (HOSPITAL_COMMUNITY): Payer: 59 | Admitting: Physical Therapy

## 2022-05-29 DIAGNOSIS — M5416 Radiculopathy, lumbar region: Secondary | ICD-10-CM

## 2022-05-29 DIAGNOSIS — M5459 Other low back pain: Secondary | ICD-10-CM | POA: Diagnosis not present

## 2022-05-29 NOTE — Therapy (Signed)
OUTPATIENT PHYSICAL THERAPY THORACOLUMBAR TREATMENT   Patient Name: Shannon [name] MRN: [id number] DOB:[birth year], 67 y.o., female Today's Date: 05/29/2022   See note below for Objective Data and Assessment of Progress/Goals.     END OF SESSION:  PT End of Session - 05/29/22 1308     Visit Number 7    Number of Visits 12    Date for PT Re-Evaluation 06/20/22    Authorization Type [employer]    Progress Note Due on Visit 12    PT Start Time 1305    PT Stop Time 1343    PT Time Calculation (min) 38 min    Activity Tolerance Patient tolerated treatment well    Behavior During Therapy WFL for tasks assessed/performed              Past Medical History:  Diagnosis Date   Absolute anemia 11/10/2015   Anemia    Anemia of renal disease 11/10/2015   Asthma    has Albuterol inhaler prn   CHF (congestive heart failure) ([place])    acute systolic CHF 01/[facility])   Chronic kidney disease    Membranous nephropathy ( Seen at [facility])   Endometrial adenocarcinoma ([place])    Gastric ulcer    History of blood transfusion    no abnormal reaction noted   History of bronchitis    last time 3-59yr ago   Hyperlipidemia    takes Pravastatin daily   Hypertension    takes Imdur and Hydralazine daily as well as Coreg   Hypothyroidism    takes Synthroid daily   Itching    on legs-has a Kenalog cream   Metabolic bone disease    Nausea    takes Zofran prn   Nonischemic cardiomyopathy ([facility]    12/2011 normal coronaries, EF 22% (EF normal 04/2013)   Obesity    Peripheral edema    takes Torsemide daily   Thyroid disease    hypothyroidism   Past Surgical History:  Procedure Laterality Date   AV FISTULA PLACEMENT Left 07/22/2012   Procedure: INSERTION OF ARTERIOVENOUS GORE-TEX GRAFT ARM;  Surgeon: CAngelia Mould MD;  [place]  Service: Vascular;  Laterality: Left;   AV FISTULA PLACEMENT Right 05/24/2017   Procedure: INSERTION OF ARTERIOVENOUS (AV) GORE-TEX GRAFT  RIGHT ARM;  Surgeon: DAngelia Mould MD;  [place] [facility]  Service: Vascular;  Laterality: Right;   CESAREAN SECTION     30+yrs ago   COLONOSCOPY     DILATION AND CURETTAGE, DIAGNOSTIC / THERAPEUTIC     left arm surgery with pin     15+yrs ago   ROBOTIC ASSISTED TOTAL HYSTERECTOMY WITH BILATERAL SALPINGO OOPHERECTOMY Bilateral 09/24/2019   Procedure: XI ROBOTIC [place]  Surgeon: [name] MD;  Location: [facility];  Service: Gynecology;  Laterality: Bilateral;   SENTINEL NODE BIOPSY N/A 09/24/2019   Procedure: SENTINEL NODE BIOPSY;  Surgeon: [name] MD;  Location: [facility];  Service: Gynecology;  Laterality: N/A;   TUBAL LIGATION     Patient Active Problem List   Diagnosis Date Noted   CKD (chronic kidney disease), stage V ([facility] 08/20/2019   Endometrial/uterine adenocarcinoma ([facility] 08/20/2019   Anemia associated with chronic renal failure 11/10/2015   Anemia of renal disease 11/10/2015   Cardiomyopathy ([facility] 04/21/2013   HTN (hypertension) 04/21/2013   Hyperlipidemia 04/21/2013   Tobacco abuse 04/21/2013   [place] 08/06/2012   Chronic kidney disease, stage  IV (severe) ([place]) 07/09/2012    PCP: Jerene Bears MD  REFERRING PROVIDER: Earlyne Iba, MD  REFERRING DIAG: lumbar radiculopathy, spinal stenosis w/neutogenic claudication  Rationale for Evaluation and Treatment: Rehabilitation  THERAPY DIAG:  Other low back pain  Radiculopathy, lumbar region  ONSET DATE: 6-7 months ago   SUBJECTIVE:                                                                                                                                                                                           SUBJECTIVE STATEMENT: RT leg is "cutting up" today. Has increased pressure in RT leg all day since the morning. Usually goes away, has been persistent today.   Eval subjective: Patient presents to  therapy with complaint of lumbar radiculopathy. This began insidiously about 6-7 months ago. She noticed a pain in RT leg when getting out of bed. This got progressively worse with time. She had MRI which showed stenosis and anterolisthesis. She was given steroid injections which did help to some degree. Pain in leg is not as bad now, but still notes RT leg symptoms first thing in morning.   PERTINENT HISTORY:  NA  PAIN:  Are you having pain? Yes: NPRS scale: 8/10 Pain location: RT lateral thigh  Pain description: pressure Aggravating factors: mornings  Relieving factors: moving around, getting up and walking   PRECAUTIONS: None  WEIGHT BEARING RESTRICTIONS: No  FALLS:  Has patient fallen in last 6 months? No  LIVING ENVIRONMENT: Lives with: lives with their family and lives alone Lives in: House/apartment Stairs: No Has following equipment at home: Single point cane and None  OCCUPATION: Disabled  PLOF: Independent  PATIENT GOALS: Get better  NEXT MD VISIT:   OBJECTIVE:   DIAGNOSTIC FINDINGS:  IMPRESSION: 1. Lumbar spine degeneration especially affecting the facets at L3-4 and below, with L4-5 anterolisthesis. 2. L4-5 high-grade spinal stenosis in part due to a ligamentum flavum ganglion in the midline.  PATIENT SURVEYS:  FOTO 75% function (was 69% function)   COGNITION: Overall cognitive status: Within functional limits for tasks assessed     SENSATION: WFL  POSTURE:  slouched seated posture  PALPATION: Mod TTP about bilat SI joints   LUMBAR ROM:   AROM eval 05/23/22  Flexion 25% limited WFL  Extension 50% limited 50% limited  Right lateral flexion [facility] WFL  Left lateral flexion 25% limited WFL  Right rotation    Left rotation      (Blank rows = not tested)  LOWER EXTREMITY MMT:    MMT Right eval Left eval Right 05/23/22 Left  05/23/22  Hip flexion '4 4 5 '$ 5  Hip extension 3- 3+ 3+ 4-  Hip abduction 4 4- 4+ 4+  Hip adduction      Hip internal  rotation      Hip external rotation      Knee flexion      Knee extension '5 5 5 5  '$ Ankle dorsiflexion '5 5 5 5  '$ Ankle plantarflexion      Ankle inversion      Ankle eversion       (Blank rows = not tested)  LUMBAR SPECIAL TESTS:  Straight leg raise test: Negative  FUNCTIONAL TESTS:  5 times sit to stand: 12.5 sec with hands on thighs (was 13.39 sec)   TODAY'S TREATMENT:                                                                                                                              DATE:  05/29/22 Standing lumbar extension 2 x 10 Seated hamstring stretch 3 x 30"  Standing heel raise 2 x10 Standing hip abduction 3 x 10 Standing hip extension 3 x 10  POE 3 min (pain decreased from 8/10-5/10)   Prone press up x 10 (no reported change)  Piriformis stretch 3 x 30" LTR 5 x 10" SLR with ab brace 2 x 10  05/23/22 Reassess FOTO MMT AROM STS x 5   POE 2 min  Bridge 10 x 5" SLR 2 x 10   Standing heel raise 2 x 10 Mini squat 2 x 10 Standing hip abduction/ extension 2 x 10 each   05/16/22 Ab brace 10 x 5" Ab march x20 SLR with ab march 2 x10  Bridge 2 x 10 Piriformis stretch 3 x 30"   Sidelying hip abduction 2 x 10 each  Eccentric STS x 10 Standing hip abduction/ extension RTB 2 x 10 each    05/05/22 Supine: Bridge 10X  SLR 10X each  Ab sets 10X5"   March with ab set 10X  Lower trunk rotations 10X each Sidelying hip abduction 10X each Prone:  POE 2 minutes  Press ups 10X   Heelsqueezes 10X5"  SLR 10X STS no UE assist eccentric lowering 10X  Standing extensions 10X  Squats 10X   PATIENT EDUCATION:  Education details: on eval findings, POC and HEP  Person educated: Patient Education method: Explanation Education comprehension: verbalized understanding  HOME EXERCISE PROGRAM: Access Code: [email or web address] URL: [email or web address] 05/16/22 Access Code: [email or web address] URL: [email or web address] Date: 05/16/2022 Prepared by:  Josue Hector  Exercises - Prone on Elbows Stretch  - 2 x daily - 7 x weekly - 1 sets - 2 reps - 2' hold - Supine Transversus Abdominis Bracing - Hands on Stomach  - 2 x daily - 7 x weekly - 1 sets - 10 reps - 5 second hold - Supine March  - 2 x daily - 7 x weekly - 2 sets - 10 reps - 5" hold - Supine Lower Trunk Rotation  - 2 x daily - 7  x weekly - 1 sets - 10 reps - 5 sec hold - Supine Piriformis Stretch with Foot on Ground  - 2 x daily - 7 x weekly - 1 sets - 3 reps - 30 second hold - Supine Bridge  - 2 x daily - 7 x weekly - 2 sets - 10 reps - 5" hold - Sidelying Hip Abduction  - 2 x daily - 7 x weekly - 2 sets - 10 reps - 5" hold - Prone Hip Extension  - 2 x daily - 7 x weekly - 2 sets - 10 reps - 5" hold - Hip Abduction with Resistance Loop  - 2 x daily - 7 x weekly - 2 sets - 10 reps - Hip Extension with Resistance Loop  - 2 x daily - 7 x weekly - 2 sets - 10 reps  Date: 04/18/2022 Prepared by: Josue Hector  Exercises - Supine Transversus Abdominis Bracing - Hands on Stomach  - 3 x daily - 7 x weekly - 1 sets - 10 reps - 5 second hold - Clamshell  - 3 x daily - 7 x weekly - 1-2 sets - 10 reps - Supine Lower Trunk Rotation  - 3 x daily - 7 x weekly - 1 sets - 10 reps - 5 sec hold - Supine Gluteal Sets  - 3 x daily - 7 x weekly - 1 sets - 10 reps - 5 sec hold  04/20/22: Bridge and march with ab set  04/24/22: POE Prone hip extension Sidelying hip abduction  ASSESSMENT:  CLINICAL IMPRESSION: Patient having difficulty with standing exercise due to complaint of increased RLE pressure. Performed prone on elbows with progression to prone press ups. Patient has difficulty describing symptoms, but ultimately reports significant reduction of RLE pain/ pressure following prone lumbar series. Decreased pain noted end of session. Educated patient on purpose and function of activity today and to continue with prone series from HEP. Therapeutic exercises, Therapeutic activity,  Neuromuscular re-education, Balance training, Gait training, Patient/Family education, Joint manipulation, Joint mobilization, Stair training, Aquatic Therapy, Dry Needling, Electrical stimulation, Spinal manipulation, Spinal mobilization, Cryotherapy, Moist heat, scar mobilization, Taping, Traction, Ultrasound, Biofeedback, Ionotophoresis '4mg'$ /ml Dexamethasone, and Manual therapy.   OBJECTIVE IMPAIRMENTS: decreased activity tolerance, decreased mobility, decreased ROM, decreased strength, increased fascial restrictions, impaired flexibility, improper body mechanics, postural dysfunction, and pain.   ACTIVITY LIMITATIONS: carrying, lifting, bending, sitting, standing, squatting, stairs, transfers, and locomotion level  PARTICIPATION LIMITATIONS: meal prep, cleaning, laundry, shopping, community activity, and yard work  PERSONAL FACTORS:  NA  are also affecting patient's functional outcome.   REHAB POTENTIAL: Good  CLINICAL DECISION MAKING: Stable/uncomplicated  EVALUATION COMPLEXITY: Low   GOALS: SHORT TERM GOALS: Target date: 05/09/2022  Patient will be independent with initial HEP and self-management strategies to improve functional outcomes Baseline:  Goal status: MET   LONG TERM GOALS: Target date: 05/30/2022  Patient will be independent with advanced HEP and self-management strategies to improve functional outcomes Baseline:  Goal status: IN PROGRESS  2.  Patient will improve FOTO score to predicted value to indicate improvement in functional outcomes Baseline: 75% function Goal status: MET  3.  Patient will report reduction of back pain to <3/10 for improved quality of life and ability to perform ADLs  Baseline: 0/10 Goal status: MET  4. Patient will have equal to or > 4+/5 MMT throughout BLE to improve ability to perform functional mobility, stair ambulation and ADLs.  Baseline: See MMT Goal status: Partially MET   PLAN:  PT FREQUENCY: 1x/week  PT DURATION: 4  weeks  PLANNED INTERVENTIONS: Therapeutic exercises, Therapeutic activity, Neuromuscular re-education, Balance training, Gait training, Patient/Family education, Joint manipulation, Joint mobilization, Stair training, Aquatic Therapy, Dry Needling, Electrical stimulation, Spinal manipulation, Spinal mobilization, Cryotherapy, Moist heat, scar mobilization, Taping, Traction, Ultrasound, Biofeedback, Ionotophoresis '4mg'$ /ml Dexamethasone, and Manual therapy. Marland Kitchen  PLAN FOR NEXT SESSION: F/u on prone lumbar extension based HEP. Progress glute and core strength as tolerated. Improve pain free lumbar ROM as able. Progress core and postural strengthening.  1:42 PM, 05/29/22 Josue Hector PT DPT  Physical Therapist with [facility]  [phone]

## 2022-06-05 ENCOUNTER — Ambulatory Visit (HOSPITAL_COMMUNITY): Payer: 59 | Admitting: Physical Therapy

## 2022-06-05 DIAGNOSIS — M5416 Radiculopathy, lumbar region: Secondary | ICD-10-CM | POA: Diagnosis not present

## 2022-06-05 DIAGNOSIS — M5459 Other low back pain: Secondary | ICD-10-CM | POA: Diagnosis not present

## 2022-06-05 NOTE — Therapy (Signed)
OUTPATIENT PHYSICAL THERAPY THORACOLUMBAR TREATMENT   Patient Name: Shannon [name] MRN: [id number] DOB:1955-06-20, 67 y.o., female Today's Date: 06/05/2022   See note below for Objective Data and Assessment of Progress/Goals.     END OF SESSION:  PT End of Session - 06/05/22 1636     Visit Number 8    Number of Visits 12    Date for PT Re-Evaluation 06/20/22    Authorization Type [employer] Medicare    Progress Note Due on Visit 12    PT Start Time 1640    PT Stop Time 1720    PT Time Calculation (min) 40 min    Activity Tolerance Patient tolerated treatment well              Past Medical History:  Diagnosis Date   Absolute anemia 11/10/2015   Anemia    Anemia of renal disease 11/10/2015   Asthma    has Albuterol inhaler prn   CHF (congestive heart failure) ([place])    acute systolic CHF 05/1939 [facility])   Chronic kidney disease    Membranous nephropathy ( Seen at [facility])   Endometrial adenocarcinoma ([place])    Gastric ulcer    History of blood transfusion    no abnormal reaction noted   History of bronchitis    last time 3-68yr ago   Hyperlipidemia    takes Pravastatin daily   Hypertension    takes Imdur and Hydralazine daily as well as Coreg   Hypothyroidism    takes Synthroid daily   Itching    on legs-has a Kenalog cream   Metabolic bone disease    Nausea    takes Zofran prn   Nonischemic cardiomyopathy ([place]    12/2011 normal coronaries, EF 22% (EF normal 04/2013)   Obesity    Peripheral edema    takes Torsemide daily   Thyroid disease    hypothyroidism   Past Surgical History:  Procedure Laterality Date   AV FISTULA PLACEMENT Left 07/22/2012   Procedure: INSERTION OF ARTERIOVENOUS GORE-TEX GRAFT ARM;  Surgeon: CAngelia Mould MD;  [facility]  Service: Vascular;  Laterality: Left;   AV FISTULA PLACEMENT Right 05/24/2017   Procedure: INSERTION OF ARTERIOVENOUS (AV) GORE-TEX GRAFT RIGHT ARM;  Surgeon: DAngelia Mould MD;  [facility]  Service: Vascular;  Laterality: Right;   CESAREAN SECTION     30+yrs ago   COLONOSCOPY     DILATION AND CURETTAGE, DIAGNOSTIC / THERAPEUTIC     left arm surgery with pin     15+yrs ago   ROBOTIC ASSISTED TOTAL HYSTERECTOMY WITH BILATERAL SALPINGO OOPHERECTOMY Bilateral 09/24/2019   [facility]  Surgeon: [name] MD;  Location: [facility];  Service: Gynecology;  Laterality: Bilateral;   SENTINEL NODE BIOPSY N/A 09/24/2019   Procedure: SENTINEL NODE BIOPSY;  Surgeon: [name] MD;  Location: [facility];  Service: Gynecology;  Laterality: N/A;   TUBAL LIGATION     Patient Active Problem List   Diagnosis Date Noted   CKD (chronic kidney disease), stage V ([facility] 08/20/2019   Endometrial/uterine adenocarcinoma ([facility] 08/20/2019   Anemia associated with chronic renal failure 11/10/2015   Anemia of renal disease 11/10/2015   Cardiomyopathy ([facility] 04/21/2013   HTN (hypertension) 04/21/2013   Hyperlipidemia 04/21/2013   Tobacco abuse 04/21/2013   End stage renal disease ([place] 08/06/2012   Chronic kidney disease, stage IV (severe) ([facility] 07/09/2012    PCP: DJerene Bears  MD  REFERRING PROVIDER: Earlyne Iba, MD  REFERRING DIAG: lumbar radiculopathy, spinal stenosis w/neutogenic claudication  Rationale for Evaluation and Treatment: Rehabilitation  THERAPY DIAG:  Other low back pain  Radiculopathy, lumbar region  ONSET DATE: 6-7 months ago   SUBJECTIVE:                                                                                                                                                                                           SUBJECTIVE STATEMENT: Patient still reports of shooting pain on the legs. Patient reports that she has been doing her HEP as directed without issues.   Eval subjective: Patient presents to therapy with complaint of lumbar radiculopathy. This began insidiously about  6-7 months ago. She noticed a pain in RT leg when getting out of bed. This got progressively worse with time. She had MRI which showed stenosis and anterolisthesis. She was given steroid injections which did help to some degree. Pain in leg is not as bad now, but still notes RT leg symptoms first thing in morning.   PERTINENT HISTORY:  NA  PAIN:  Are you having pain? Yes: NPRS scale: 4/10 Pain location: RT lateral thigh  Pain description: pressure Aggravating factors: mornings  Relieving factors: moving around, getting up and walking   PRECAUTIONS: None  WEIGHT BEARING RESTRICTIONS: No  FALLS:  Has patient fallen in last 6 months? No  LIVING ENVIRONMENT: Lives with: lives with their family and lives alone Lives in: House/apartment Stairs: No Has following equipment at home: Single point cane and None  OCCUPATION: Disabled  PLOF: Independent  PATIENT GOALS: Get better  NEXT MD VISIT:   OBJECTIVE:   DIAGNOSTIC FINDINGS:  IMPRESSION: 1. Lumbar spine degeneration especially affecting the facets at L3-4 and below, with L4-5 anterolisthesis. 2. L4-5 high-grade spinal stenosis in part due to a ligamentum flavum ganglion in the midline.  PATIENT SURVEYS:  FOTO 75% function (was 69% function)   COGNITION: Overall cognitive status: Within functional limits for tasks assessed     SENSATION: WFL  POSTURE:  slouched seated posture  PALPATION: Mod TTP about bilat SI joints   LUMBAR ROM:   AROM eval 05/23/22  Flexion 25% limited WFL  Extension 50% limited 50% limited  Right lateral flexion [facility] WFL  Left lateral flexion 25% limited WFL  Right rotation    Left rotation      (Blank rows = not tested)  LOWER EXTREMITY MMT:    MMT Right eval Left eval Right 05/23/22 Left  05/23/22  Hip flexion '4 4 5 5  '$ Hip extension 3- 3+ 3+ 4-  Hip abduction 4 4- 4+  4+  Hip adduction      Hip internal rotation      Hip external rotation      Knee flexion      Knee extension  '5 5 5 5  '$ Ankle dorsiflexion '5 5 5 5  '$ Ankle plantarflexion      Ankle inversion      Ankle eversion       (Blank rows = not tested)  LUMBAR SPECIAL TESTS:  Straight leg raise test: Negative  FUNCTIONAL TESTS:  5 times sit to stand: 12.5 sec with hands on thighs (was 13.39 sec)   TODAY'S TREATMENT:                                                                                                                              DATE:  06/05/22 Recumbent stepper, seat 9, arm 9, x level 1 x 5' Seated  hamstring stretch 3 x 30" Piriformis stretch 3 x 30" R sciatic nerve floss x 1' Standing  lumbar extension 2 x 10 Hip hinges with dowel behind back x 10 x 2 hip abduction 2#, 2 x 10 hip extension 2#, 2x 10 POE 3 min  Prone press up x 10  LTR 5 x 10" SLR with ab brace 2 x 10  05/29/22 Standing lumbar extension 2 x 10 Seated hamstring stretch 3 x 30"  Standing heel raise 2 x10 Standing hip abduction 3 x 10 Standing hip extension 3 x 10  POE 3 min (pain decreased from 8/10-5/10)   Prone press up x 10 (no reported change)  Piriformis stretch 3 x 30" LTR 5 x 10" SLR with ab brace 2 x 10  05/23/22 Reassess FOTO MMT AROM STS x 5   POE 2 min  Bridge 10 x 5" SLR 2 x 10   Standing heel raise 2 x 10 Mini squat 2 x 10 Standing hip abduction/ extension 2 x 10 each   05/16/22 Ab brace 10 x 5" Ab march x20 SLR with ab march 2 x10  Bridge 2 x 10 Piriformis stretch 3 x 30"   Sidelying hip abduction 2 x 10 each  Eccentric STS x 10 Standing hip abduction/ extension RTB 2 x 10 each    05/05/22 Supine: Bridge 10X  SLR 10X each  Ab sets 10X5"   March with ab set 10X  Lower trunk rotations 10X each Sidelying hip abduction 10X each Prone:  POE 2 minutes  Press ups 10X   Heelsqueezes 10X5"  SLR 10X STS no UE assist eccentric lowering 10X  Standing extensions 10X  Squats 10X   PATIENT EDUCATION:  Education details: on eval findings, POC and HEP  Person educated:  Patient Education method: Explanation Education comprehension: verbalized understanding  HOME EXERCISE PROGRAM: Access Code: [email or web address] URL: [email or web address] 05/[email or web address]/22 Access Code: [email or web address] URL: [email or web address] Date: 05/16/2022 Prepared by: Josue Hector  06/05/22 Hip hinges with a dowel - 2 x daily - 7 x weekly - 2 sets -  10 reps Standing lumbar ext - 2 x daily - 7 x weekly - 2 sets - 10 reps  Exercises - Prone on Elbows Stretch  - 2 x daily - 7 x weekly - 1 sets - 2 reps - 2' hold - Supine Transversus Abdominis Bracing - Hands on Stomach  - 2 x daily - 7 x weekly - 1 sets - 10 reps - 5 second hold - Supine March  - 2 x daily - 7 x weekly - 2 sets - 10 reps - 5" hold - Supine Lower Trunk Rotation  - 2 x daily - 7 x weekly - 1 sets - 10 reps - 5 sec hold - Supine Piriformis Stretch with Foot on Ground  - 2 x daily - 7 x weekly - 1 sets - 3 reps - 30 second hold - Supine Bridge  - 2 x daily - 7 x weekly - 2 sets - 10 reps - 5" hold - Sidelying Hip Abduction  - 2 x daily - 7 x weekly - 2 sets - 10 reps - 5" hold - Prone Hip Extension  - 2 x daily - 7 x weekly - 2 sets - 10 reps - 5" hold - Hip Abduction with Resistance Loop  - 2 x daily - 7 x weekly - 2 sets - 10 reps - Hip Extension with Resistance Loop  - 2 x daily - 7 x weekly - 2 sets - 10 reps  Date: 04/18/2022 Prepared by: Josue Hector  Exercises - Supine Transversus Abdominis Bracing - Hands on Stomach  - 3 x daily - 7 x weekly - 1 sets - 10 reps - 5 second hold - Clamshell  - 3 x daily - 7 x weekly - 1-2 sets - 10 reps - Supine Lower Trunk Rotation  - 3 x daily - 7 x weekly - 1 sets - 10 reps - 5 sec hold - Supine Gluteal Sets  - 3 x daily - 7 x weekly - 1 sets - 10 reps - 5 sec hold  04/20/22: Bridge and march with ab set  04/24/22: POE Prone hip extension Sidelying hip abduction  ASSESSMENT:  CLINICAL IMPRESSION: Tolerated all activities without worsening of symptoms  including the seated sciatic nerve floss. Demonstrated appropriate levels of fatigue. Required slight amount of cueing to ensure correct execution of activity. Did not report of shooting pain down the legs  OBJECTIVE IMPAIRMENTS: decreased activity tolerance, decreased mobility, decreased ROM, decreased strength, increased fascial restrictions, impaired flexibility, improper body mechanics, postural dysfunction, and pain.   ACTIVITY LIMITATIONS: carrying, lifting, bending, sitting, standing, squatting, stairs, transfers, and locomotion level  PARTICIPATION LIMITATIONS: meal prep, cleaning, laundry, shopping, community activity, and yard work  PERSONAL FACTORS:  NA  are also affecting patient's functional outcome.   REHAB POTENTIAL: Good  CLINICAL DECISION MAKING: Stable/uncomplicated  EVALUATION COMPLEXITY: Low   GOALS: SHORT TERM GOALS: Target date: 05/09/2022  Patient will be independent with initial HEP and self-management strategies to improve functional outcomes Baseline:  Goal status: MET   LONG TERM GOALS: Target date: 05/30/2022  Patient will be independent with advanced HEP and self-management strategies to improve functional outcomes Baseline:  Goal status: IN PROGRESS  2.  Patient will improve FOTO score to predicted value to indicate improvement in functional outcomes Baseline: 75% function Goal status: MET  3.  Patient will report reduction of back pain to <3/10 for improved quality of life and ability to perform ADLs  Baseline: 0/10 Goal status: MET  4. Patient will have equal to or > 4+/5 MMT throughout BLE to improve ability to perform functional mobility, stair ambulation and ADLs.  Baseline: See MMT Goal status: Partially MET   PLAN:  PT FREQUENCY: 1x/week  PT DURATION: 4 weeks  PLANNED INTERVENTIONS: Therapeutic exercises, Therapeutic activity, Neuromuscular re-education, Balance training, Gait training, Patient/Family education, Joint manipulation,  Joint mobilization, Stair training, Aquatic Therapy, Dry Needling, Electrical stimulation, Spinal manipulation, Spinal mobilization, Cryotherapy, Moist heat, scar mobilization, Taping, Traction, Ultrasound, Biofeedback, Ionotophoresis '4mg'$ /ml Dexamethasone, and Manual therapy. Marland Kitchen  PLAN FOR NEXT SESSION: Continue POC and may progress as tolerated with emphasis on core stabilization and centralization of symptoms.   5:29 PM, 06/05/22 Harvie Heck. Jacci Ruberg, PT, DPT, OCS Board-Certified Clinical Specialist in [place] # ([place]): [phone]

## 2022-06-12 ENCOUNTER — Encounter (HOSPITAL_COMMUNITY): Payer: Self-pay | Admitting: Physical Therapy

## 2022-06-12 ENCOUNTER — Ambulatory Visit (HOSPITAL_COMMUNITY): Payer: 59 | Admitting: Physical Therapy

## 2022-06-12 DIAGNOSIS — M5416 Radiculopathy, lumbar region: Secondary | ICD-10-CM

## 2022-06-12 DIAGNOSIS — M5459 Other low back pain: Secondary | ICD-10-CM | POA: Diagnosis not present

## 2022-06-12 NOTE — Therapy (Signed)
OUTPATIENT PHYSICAL THERAPY THORACOLUMBAR TREATMENT   Patient Name: Shannon [name] MRN: [id number] DOB:03-02-1956, 67 y.o., female Today's Date: 06/12/2022   See note below for Objective Data and Assessment of Progress/Goals.     END OF SESSION:  PT End of Session - 06/12/22 1528     Visit Number 9    Number of Visits 12    Date for PT Re-Evaluation 06/20/22    Authorization Type [employer]    Progress Note Due on Visit 12    PT Start Time 1525    PT Stop Time 1603    PT Time Calculation (min) 38 min    Activity Tolerance Patient tolerated treatment well    Behavior During Therapy WFL for tasks assessed/performed              Past Medical History:  Diagnosis Date   Absolute anemia 11/10/2015   Anemia    Anemia of renal disease 11/10/2015   Asthma    has Albuterol inhaler prn   CHF (congestive heart failure) ([place])    acute systolic CHF 09/[facility])   Chronic kidney disease    Membranous nephropathy ( Seen at [facility])   Endometrial adenocarcinoma ([place])    Gastric ulcer    History of blood transfusion    no abnormal reaction noted   History of bronchitis    last time 3-70yr ago   Hyperlipidemia    takes Pravastatin daily   Hypertension    takes Imdur and Hydralazine daily as well as Coreg   Hypothyroidism    takes Synthroid daily   Itching    on legs-has a Kenalog cream   Metabolic bone disease    Nausea    takes Zofran prn   [place]mic [place]    12/2011 normal coronaries, EF 22% (EF normal 04/2013)   Obesity    Peripheral edema    takes Torsemide daily   Thyroid disease    hypothyroidism   Past Surgical History:  Procedure Laterality Date   AV FISTULA PLACEMENT Left 07/22/2012   Procedure: INSERTION OF ARTERIOVENOUS GORE-TEX GRAFT ARM;  Surgeon: CAngelia Mould MD;  [place]  Service: Vascular;  Laterality: Left;   AV FISTULA PLACEMENT Right 05/24/2017   Procedure: INSERTION OF ARTERIOVENOUS (AV) GORE-TEX GRAFT  RIGHT ARM;  Surgeon: DAngelia Mould MD;  [place]  Service: Vascular;  Laterality: Right;   CESAREAN SECTION     30+yrs ago   COLONOSCOPY     DILATION AND CURETTAGE, DIAGNOSTIC / THERAPEUTIC     left arm surgery with pin     15+yrs ago   ROBOTIC ASSISTED TOTAL HYSTERECTOMY WITH BILATERAL SALPINGO OOPHERECTOMY Bilateral 09/24/2019   Procedure: [place]  Surgeon: [name] MD;  [facility];  Service: Gynecology;  Laterality: Bilateral;   SENTINEL NODE BIOPSY N/A 09/24/2019   Procedure: SENTINEL NODE BIOPSY;  Surgeon: [name] MD;  [facility];  Service: Gynecology;  Laterality: N/A;   TUBAL LIGATION     Patient Active Problem List   Diagnosis Date Noted   [place]chronic [place] disease), stage V ([place] 08/20/[place]   [place]ometrial[place]uterine adenocarcinoma [place] 08/20/2019   Anemia associated with chronic renal failure 11/10/2015   Anemia of renal disease 11/10/2015   Cardiomyopathy ([place] 04/21/2013   HTN (hypertension) 04/21/2013   Hyperlipidemia 04/21/2013   Tobacco abuse 04/21/2013   End stage renal disease ([place] 08/06/2012   Chronic kidney disease, stage  IV (severe) ([place]) 07/09/2012    PCP: Jerene Bears MD  REFERRING PROVIDER: Earlyne Iba, MD  REFERRING DIAG: lumbar radiculopathy, spinal stenosis w/neutogenic claudication  Rationale for Evaluation and Treatment: Rehabilitation  THERAPY DIAG:  Other low back pain  Radiculopathy, lumbar region  ONSET DATE: 6-7 months ago   SUBJECTIVE:                                                                                                                                                                                           SUBJECTIVE STATEMENT: Reports "good days and bad days". She cannot discern a pattern. No pain right now, though describes that her leg feels "Some kind of way"   Eval subjective: Patient presents to  therapy with complaint of lumbar radiculopathy. This began insidiously about 6-7 months ago. She noticed a pain in RT leg when getting out of bed. This got progressively worse with time. She had MRI which showed stenosis and anterolisthesis. She was given steroid injections which did help to some degree. Pain in leg is not as bad now, but still notes RT leg symptoms first thing in morning.   PERTINENT HISTORY:  NA  PAIN:  Are you having pain? Yes: NPRS scale: 0/10 Pain location: RT lateral thigh  Pain description: pressure Aggravating factors: mornings  Relieving factors: moving around, getting up and walking   PRECAUTIONS: None  WEIGHT BEARING RESTRICTIONS: No  FALLS:  Has patient fallen in last 6 months? No  LIVING ENVIRONMENT: Lives with: lives with their family and lives alone Lives in: House/apartment Stairs: No Has following equipment at home: Single point cane and None  OCCUPATION: Disabled  PLOF: Independent  PATIENT GOALS: Get better  NEXT MD VISIT:   OBJECTIVE:   DIAGNOSTIC FINDINGS:  IMPRESSION: 1. Lumbar spine degeneration especially affecting the facets at L3-4 and below, with L4-5 anterolisthesis. 2. L4-5 high-grade spinal stenosis in part due to a ligamentum flavum ganglion in the midline.  PATIENT SURVEYS:  FOTO 75% function (was 69% function)   COGNITION: Overall cognitive status: Within functional limits for tasks assessed     SENSATION: WFL  POSTURE:  slouched seated posture  PALPATION: Mod TTP about bilat SI joints   LUMBAR ROM:   AROM eval 05/23/22  Flexion 25% limited WFL  Extension 50% limited 50% limited  Right lateral flexion [facility] WFL  Left lateral flexion 25% limited WFL  Right rotation    Left rotation      (Blank rows = not tested)  LOWER EXTREMITY MMT:    MMT Right eval Left eval Right 05/23/22 Left  05/23/22  Hip flexion '4 4 5 '$ 5  Hip extension 3- 3+ 3+ 4-  Hip abduction 4 4- 4+ 4+  Hip adduction      Hip internal  rotation      Hip external rotation      Knee flexion      Knee extension '5 5 5 5  '$ Ankle dorsiflexion '5 5 5 5  '$ Ankle plantarflexion      Ankle inversion      Ankle eversion       (Blank rows = not tested)  LUMBAR SPECIAL TESTS:  Straight leg raise test: Negative  FUNCTIONAL TESTS:  5 times sit to stand: 12.5 sec with hands on thighs (was 13.39 sec)   TODAY'S TREATMENT:                                                                                                                              DATE:  06/12/22 Standing lumbar flexion in standing x 10 (no effect)  Chair squats x 10   Band rows GTB 2 x 10  Shoulder extension GTB 2 x 10 Palloff press GTB 2 x 10, single handle (increased RT side low back pain)  POE 3 min (pain improved 75%) Prone press ups x10 (pain abloshed)   Prone alternating hip extension x20 Prone alternating UE/ LE lift with 3" hold  06/05/22 Recumbent stepper, seat 9, arm 9, x level 1 x 5' Seated  hamstring stretch 3 x 30" Piriformis stretch 3 x 30" R sciatic nerve floss x 1' Standing  lumbar extension 2 x 10 Hip hinges with dowel behind back x 10 x 2 hip abduction 2#, 2 x 10 hip extension 2#, 2x 10 POE 3 min  Prone press up x 10  LTR 5 x 10" SLR with ab brace 2 x 10  05/29/22 Standing lumbar extension 2 x 10 Seated hamstring stretch 3 x 30"  Standing heel raise 2 x10 Standing hip abduction 3 x 10 Standing hip extension 3 x 10  POE 3 min (pain decreased from 8/10-5/10)   Prone press up x 10 (no reported change)  Piriformis stretch 3 x 30" LTR 5 x 10" SLR with ab brace 2 x 10   PATIENT EDUCATION:  Education details: on eval findings, POC and HEP  Person educated: Patient Education method: Explanation Education comprehension: verbalized understanding  HOME EXERCISE PROGRAM: Access Code: [email or web address] URL: [email or web address] 06/12/22 - Prone Alternating Arm and Leg Lifts  - 2 x daily - 7 x weekly - 2 sets - 10 reps - 3  second hold  05/16/22 Access Code: [email or web address] URL: [email or web address] Date: 05/16/2022 Prepared by: Josue Hector  06/05/22 Hip hinges with a dowel - 2 x daily - 7 x weekly - 2 sets - 10 reps Standing lumbar ext - 2 x daily - 7 x weekly - 2 sets - 10 reps  Exercises - Prone on Elbows Stretch  - 2 x daily - 7 x weekly - 1 sets - 2 reps - 2'  hold - Supine Transversus Abdominis Bracing - Hands on Stomach  - 2 x daily - 7 x weekly - 1 sets - 10 reps - 5 second hold - Supine March  - 2 x daily - 7 x weekly - 2 sets - 10 reps - 5" hold - Supine Lower Trunk Rotation  - 2 x daily - 7 x weekly - 1 sets - 10 reps - 5 sec hold - Supine Piriformis Stretch with Foot on Ground  - 2 x daily - 7 x weekly - 1 sets - 3 reps - 30 second hold - Supine Bridge  - 2 x daily - 7 x weekly - 2 sets - 10 reps - 5" hold - Sidelying Hip Abduction  - 2 x daily - 7 x weekly - 2 sets - 10 reps - 5" hold - Prone Hip Extension  - 2 x daily - 7 x weekly - 2 sets - 10 reps - 5" hold - Hip Abduction with Resistance Loop  - 2 x daily - 7 x weekly - 2 sets - 10 reps - Hip Extension with Resistance Loop  - 2 x daily - 7 x weekly - 2 sets - 10 reps  Date: 04/18/2022 Prepared by: Josue Hector  Exercises - Supine Transversus Abdominis Bracing - Hands on Stomach  - 3 x daily - 7 x weekly - 1 sets - 10 reps - 5 second hold - Clamshell  - 3 x daily - 7 x weekly - 1-2 sets - 10 reps - Supine Lower Trunk Rotation  - 3 x daily - 7 x weekly - 1 sets - 10 reps - 5 sec hold - Supine Gluteal Sets  - 3 x daily - 7 x weekly - 1 sets - 10 reps - 5 sec hold  04/20/22: Bridge and march with ab set  04/24/22: POE Prone hip extension Sidelying hip abduction  ASSESSMENT:  CLINICAL IMPRESSION: Patient with ongoing report of variable pain with no known pattern. Performed standing lumbar flexion as diagnostic to test flexion intolerance. There was no effect. Progressed core strengthening to include palloff press for  isometric rotation/ oblique activity. Patient noting increased back pain following. Proceeded to lumbar extension series which ultimately abolished pain. Progressed prone position back and core isometrics with explanation to patient of function. Added to HEP. Patient will continue to benefit from skilled therapy services to reduce remaining deficits and improve functional ability.    OBJECTIVE IMPAIRMENTS: decreased activity tolerance, decreased mobility, decreased ROM, decreased strength, increased fascial restrictions, impaired flexibility, improper body mechanics, postural dysfunction, and pain.   ACTIVITY LIMITATIONS: carrying, lifting, bending, sitting, standing, squatting, stairs, transfers, and locomotion level  PARTICIPATION LIMITATIONS: meal prep, cleaning, laundry, shopping, community activity, and yard work  PERSONAL FACTORS:  NA  are also affecting patient's functional outcome.   REHAB POTENTIAL: Good  CLINICAL DECISION MAKING: Stable/uncomplicated  EVALUATION COMPLEXITY: Low   GOALS: SHORT TERM GOALS: Target date: 05/09/2022  Patient will be independent with initial HEP and self-management strategies to improve functional outcomes Baseline:  Goal status: MET   LONG TERM GOALS: Target date: 05/30/2022  Patient will be independent with advanced HEP and self-management strategies to improve functional outcomes Baseline:  Goal status: IN PROGRESS  2.  Patient will improve FOTO score to predicted value to indicate improvement in functional outcomes Baseline: 75% function Goal status: MET  3.  Patient will report reduction of back pain to <3/10 for improved quality of life and ability to perform ADLs  Baseline: 0/10 Goal status: MET  4. Patient will have equal to or > 4+/5 MMT throughout BLE to improve ability to perform functional mobility, stair ambulation and ADLs.  Baseline: See MMT Goal status: Partially MET   PLAN:  PT FREQUENCY: 1x/week  PT DURATION: 4  weeks  PLANNED INTERVENTIONS: Therapeutic exercises, Therapeutic activity, Neuromuscular re-education, Balance training, Gait training, Patient/Family education, Joint manipulation, Joint mobilization, Stair training, Aquatic Therapy, Dry Needling, Electrical stimulation, Spinal manipulation, Spinal mobilization, Cryotherapy, Moist heat, scar mobilization, Taping, Traction, Ultrasound, Biofeedback, Ionotophoresis '4mg'$ /ml Dexamethasone, and Manual therapy. Marland Kitchen  PLAN FOR NEXT SESSION: Continue POC and may progress as tolerated with emphasis on core stabilization and centralization of symptoms.   3:29 PM, 06/12/22 Josue Hector PT DPT  Physical Therapist with [facility]  [phone]

## 2022-06-18 ENCOUNTER — Ambulatory Visit: Payer: 59 | Admitting: Podiatry

## 2022-06-18 VITALS — BP 132/64 | HR 67

## 2022-06-18 DIAGNOSIS — B353 Tinea pedis: Secondary | ICD-10-CM | POA: Diagnosis not present

## 2022-06-18 MED ORDER — CLOTRIMAZOLE-BETAMETHASONE 1-0.05 % EX CREA: 1.0000 | TOPICAL_CREAM | Freq: Two times a day (BID) | CUTANEOUS | 3 refills | Status: DC

## 2022-06-18 NOTE — Progress Notes (Signed)
Chief Complaint  Patient presents with   Nail Problem    Routine foot care, patient has a rash on the right hallux, dry skin,  and callus on the left forefoot    HPI: 67 y.o. female presenting today as a new patient for evaluation of itching with thick skin to the right great toe as well as the plantar arch of the left foot.  This has been ongoing for a few months now.  Idiopathic onset.  Patient states that she was prescribed some ketoconazole 2% but there has been no improvement.  She continues to have itching to the skin.  She presents for further treatment and evaluation  Past Medical History:  Diagnosis Date   Absolute anemia 11/10/2015   Anemia    Anemia of renal disease 11/10/2015   Asthma    has Albuterol inhaler prn   CHF (congestive heart failure) ([place])    acute systolic CHF 07/[facility])   Chronic kidney disease    Membranous nephropathy ( Seen at [facility])   Endometrial adenocarcinoma ([place])    Gastric ulcer    History of blood transfusion    no abnormal reaction noted   History of bronchitis    last time 3-60yr ago   Hyperlipidemia    takes Pravastatin daily   Hypertension    takes Imdur and Hydralazine daily as well as Coreg   Hypothyroidism    takes Synthroid daily   Itching    on legs-has a Kenalog cream   Metabolic bone disease    Nausea    takes Zofran prn   Nonischemic cardiomyopathy ([place]    12/2011 normal coronaries, EF 22% (EF normal 04/2013)   Obesity    Peripheral edema    takes Torsemide daily   Thyroid disease    hypothyroidism    Past Surgical History:  Procedure Laterality Date   AV FISTULA PLACEMENT Left 07/22/2012   Procedure: INSERTION OF ARTERIOVENOUS GORE-TEX GRAFT ARM;  Surgeon: CAngelia Mould MD;  [place]  Service: Vascular;  Laterality: Left;   AV FISTULA PLACEMENT Right 05/24/2017   Procedure: INSERTION OF ARTERIOVENOUS (AV) GORE-TEX GRAFT RIGHT ARM;  Surgeon: DAngelia Mould MD;  [place]  Service:  Vascular;  Laterality: Right;   CESAREAN SECTION     30+yrs ago   COLONOSCOPY     DILATION AND CURETTAGE, DIAGNOSTIC / THERAPEUTIC     left arm surgery with pin     15+yrs ago   ROBOTIC ASSISTED TOTAL HYSTERECTOMY WITH BILATERAL SALPINGO OOPHERECTOMY Bilateral 09/24/2019   Procedure: [place]IC [place]  Surgeon: [name] MD;  [facility];  Service: Gynecology;  Laterality: Bilateral;   SENTINEL NODE BIOPSY N/A 09/24/2019   Procedure: SENTINEL NODE BIOPSY;  Surgeon: [name] MD;  [facility];  Service: Gynecology;  Laterality: N/A;   TUBAL LIGATION      Allergies  Allergen Reactions   Corticosteroids Hives, Itching, Nausea And Vomiting and Rash    Tolerated dexamethasone when being treated for a reaction to rituximab   Other Hives, Itching, Nausea And Vomiting and Rash    Tolerated dexamethasone when being treated for a reaction to rituximab Tolerated dexamethasone when being treated for a reaction to rituximab  Tolerated dexamethasone when being treated for a reaction to rituximab   Rituximab Shortness Of Breath    wheezing wheezing      Physical Exam: General: The patient is alert and oriented x3  in no acute distress.  Dermatology: There is some hyperkeratosis of skin with flaking to the right great toe as well as the plantar arch of the left foot with pruritus.  No open wounds noted.  Remaining integument unremarkable  Vascular: Palpable pedal pulses bilaterally. Capillary refill within normal limits.  Negative for any significant edema or erythema  Neurological: Grossly intact via light touch  Musculoskeletal Exam: No pedal deformities noted   Assessment: 1.  Tinea pedis right great toe and left plantar arch -Patient evaluated -Prescription for Lotrisone cream apply 2 times daily.  Discontinue ketoconazole 2% -Recommend good foot hygiene and keeping her feet dry -Return to clinic  6 weeks     Edrick Kins, DPM [facility]  Dr. Edrick Kins, DPM    [address], [place] [address]                Office [phone]  Fax [phone]

## 2022-06-22 ENCOUNTER — Encounter (HOSPITAL_COMMUNITY): Payer: 59

## 2022-06-26 ENCOUNTER — Encounter (HOSPITAL_COMMUNITY): Payer: 59

## 2022-06-26 ENCOUNTER — Ambulatory Visit (HOSPITAL_COMMUNITY): Payer: 59 | Attending: Pain Medicine

## 2022-06-26 DIAGNOSIS — M5416 Radiculopathy, lumbar region: Secondary | ICD-10-CM | POA: Diagnosis not present

## 2022-06-26 DIAGNOSIS — M5459 Other low back pain: Secondary | ICD-10-CM

## 2022-06-26 NOTE — Therapy (Signed)
OUTPATIENT PHYSICAL THERAPY TREATMENT NOTE PHYSICAL THERAPY DISCHARGE SUMMARY  Visits from Start of Care: 10  Current functional level related to goals / functional outcomes: See below   Remaining deficits: See below   Education / Equipment: Updated and reviewed HEP   Patient agrees to discharge. Patient goals were partially met. Patient is being discharged due to absence of pain, and improvement in ROM and strength   Patient Name: Shannon [name] MRN: [id number] DOB:05/06/1956, 67 y.o., female Today's Date: 06/26/2022   See note below for Objective Data and Assessment of Progress/Goals.     END OF SESSION:  PT End of Session - 06/26/22 1434     Visit Number 10    Number of Visits 12    Date for PT Re-Evaluation 06/20/22    Authorization Type [employer] Medicare    Progress Note Due on Visit 12    PT Start Time 1430    PT Stop Time 1515    PT Time Calculation (min) 45 min    Activity Tolerance Patient tolerated treatment well    Behavior During Therapy WFL for tasks assessed/performed            Past Medical History:  Diagnosis Date   Absolute anemia 11/10/2015   Anemia    Anemia of renal disease 11/10/2015   Asthma    has Albuterol inhaler prn   CHF (congestive heart failure) ([place])    acute systolic CHF [id number] [facility])   Chronic kidney disease    Membranous nephropathy ( Seen at [facility])   Endometrial adenocarcinoma ([place])    Gastric ulcer    History of blood transfusion    no abnormal reaction noted   History of bronchitis    last time 3-84yr ago   Hyperlipidemia    takes Pravastatin daily   Hypertension    takes Imdur and Hydralazine daily as well as Coreg   Hypothyroidism    takes Synthroid daily   Itching    on legs-has a Kenalog cream   Metabolic bone disease    Nausea    takes Zofran prn   [place]ischemic cardiomyopathy ([place]    12/2011 normal coronaries, EF 22% (EF normal 04/2013)   Obesity    Peripheral edema    takes Torsemide daily   Thyroid  disease    hypothyroidism   Past Surgical History:  Procedure Laterality Date   AV FISTULA PLACEMENT Left 07/22/2012   Procedure: INSERTION OF ARTERIOVENOUS GORE-TEX GRAFT ARM;  Surgeon: CAngelia Mould MD;  [place]  Service: Vascular;  Laterality: Left;   AV FISTULA PLACEMENT Right 05/24/2017   Procedure: INSERTION OF ARTERIOVENOUS (AV) GORE-TEX GRAFT RIGHT ARM;  Surgeon: DAngelia Mould MD;  [place] [facility]  Service: Vascular;  Laterality: Right;   CESAREAN SECTION     30+yrs ago   COLONOSCOPY     DILATION AND CURETTAGE, DIAGNOSTIC / THERAPEUTIC     left arm surgery with pin     15+yrs ago   ROBOTIC ASSISTED TOTAL HYSTERECTOMY WITH BILATERAL SALPINGO OOPHERECTOMY Bilateral 09/24/2019   Procedure: XI [place]IC [place]  Surgeon: [name] MD;  [facility];  Service: Gynecology;  Laterality: Bilateral;   SENTINEL NODE BIOPSY N/A 09/24/2019   Procedure: SENTINEL NODE BIOPSY;  Surgeon: [name] MD;  [facility];  Service: Gynecology;  Laterality: N/A;   TUBAL LIGATION     Patient Active Problem List   Diagnosis Date Noted   CKD (  chronic kidney disease), stage V ([place]) 08/20/2019   Endometrial/uterine adenocarcinoma ([place]) 08/20/2019   Anemia associated with chronic renal failure 11/10/2015   Anemia of renal disease 11/10/2015   Cardiomyopathy ([place]) 04/21/2013   HTN (hypertension) 04/21/2013   Hyperlipidemia 04/21/2013   Tobacco abuse 04/21/2013   End stage renal disease ([place]) 08/06/2012   Chronic kidney disease, stage IV (severe) ([place]) 07/09/2012    PCP: Jerene Bears MD  REFERRING PROVIDER: Earlyne Iba, MD  REFERRING DIAG: lumbar radiculopathy, spinal stenosis w/neutogenic claudication  Rationale for Evaluation and Treatment: Rehabilitation  THERAPY DIAG:  Other low back pain  Radiculopathy, lumbar region  ONSET DATE: 6-7 months ago   SUBJECTIVE:                                                                                                                                                                                            SUBJECTIVE STATEMENT: Patient reports that her legs and back were hurting bad yesterday. However, patient reports that she's doing much better today and is not having any shooting pain on the legs and on the back (0/10 on NPRS). Patient thinks that she's done with PT and doesn't want to come to PT anymore because everything feels the same but states that she has gotten better with PT.  Eval subjective: Patient presents to therapy with complaint of lumbar radiculopathy. This began insidiously about 6-7 months ago. She noticed a pain in RT leg when getting out of bed. This got progressively worse with time. She had MRI which showed stenosis and anterolisthesis. She was given steroid injections which did help to some degree. Pain in leg is not as bad now, but still notes RT leg symptoms first thing in morning.   PERTINENT HISTORY:  NA  PAIN:  Are you having pain? Yes: NPRS scale: 0/10 Pain location: RT lateral thigh  Pain description: pressure Aggravating factors: mornings  Relieving factors: moving around, getting up and walking   PRECAUTIONS: None  WEIGHT BEARING RESTRICTIONS: No  FALLS:  Has patient fallen in last 6 months? No  LIVING ENVIRONMENT: Lives with: lives with their family and lives alone Lives in: House/apartment Stairs: No Has following equipment at home: Single point cane and None  OCCUPATION: Disabled  PLOF: Independent  PATIENT GOALS: Get better  NEXT MD VISIT:   OBJECTIVE:   DIAGNOSTIC FINDINGS:  IMPRESSION: 1. Lumbar spine degeneration especially affecting the facets at L3-4 and below, with L4-5 anterolisthesis. 2. L4-5 high-grade spinal stenosis in part due to a ligamentum flavum ganglion in the midline.  PATIENT SURVEYS:  FOTO 59.52% from 75% function (was 69% function initially)  COGNITION: Overall cognitive status: Within functional limits for tasks assessed     SENSATION: WFL  POSTURE:  slouched seated posture  PALPATION: Mod TTP about bilat SI joints   LUMBAR ROM:   AROM eval 06/26/22  Flexion 25% limited WFL  Extension 50% limited 50% limited  Right lateral flexion [facility] WFL  Left lateral flexion 25% limited WFL  Right rotation    Left rotation      (Blank rows = not tested)  LOWER EXTREMITY MMT:    MMT Right eval Left eval Right 06/26/22 Left  06/26/22  Hip flexion 4 4 5 5  $ Hip extension 3- 3+ 4+ 4+  Hip abduction 4 4- 4+ 4+  Hip adduction      Hip internal rotation      Hip external rotation      Knee flexion      Knee extension 5 5 5 5  $ Ankle dorsiflexion 5 5 5 5  $ Ankle plantarflexion      Ankle inversion      Ankle eversion       (Blank rows = not tested)  LUMBAR SPECIAL TESTS:  Straight leg raise test: Negative  FUNCTIONAL TESTS:  5 times sit to stand: 13.03 sec little to no hands on thighs from 12.5 sec with hands on thighs (was 13.39 sec initially)   TODAY'S TREATMENT:                                                                                                                              DATE:  06/26/22 Progress assessment (FOTO, MMT,  trunk ROM) Recumbent stepper, seat 9, arm 9, x level 2 x 5' Band rows GTB 2 x 10  Shoulder extension GTB 2 x 10 Palloff press GTB 2 x 10, single handle Supine R sciatic nerve glide x 1' Seated hamstring stretch x 30" x 3  06/12/22 Standing lumbar flexion in standing x 10 (no effect)  Chair squats x 10   Band rows GTB 2 x 10  Shoulder extension GTB 2 x 10 Palloff press GTB 2 x 10, single handle (increased RT side low back pain)  POE 3 min (pain improved 75%) Prone press ups x10 (pain abloshed)   Prone alternating hip extension x20 Prone alternating UE/ LE lift with 3" hold  06/05/22 Recumbent stepper, seat 9, arm 9, x level 1 x 5' Seated  hamstring stretch 3 x  30" Piriformis stretch 3 x 30" R sciatic nerve floss x 1' Standing  lumbar extension 2 x 10 Hip hinges with dowel behind back x 10 x 2 hip abduction 2#, 2 x 10 hip extension 2#, 2x 10 POE 3 min  Prone press up x 10  LTR 5 x 10" SLR with ab brace 2 x 10  05/29/22 Standing lumbar extension 2 x 10 Seated hamstring stretch 3 x 30"  Standing heel raise 2 x10 Standing hip abduction 3 x 10 Standing hip extension 3 x 10  POE 3 min (pain decreased from 8/10-5/10)   Prone press up x 10 (no reported change)  Piriformis stretch 3 x 30" LTR 5 x 10" SLR with ab brace 2 x 10   PATIENT EDUCATION:  Education details: Educated on the goals and reason for D/C. Updated HEP  Person educated: Patient Education method: Explanation, Demonstration, and Handouts Education comprehension: verbalized understanding and returned demonstration  HOME EXERCISE PROGRAM: Access Code: [email or web address] URL: [email or web address] 06/26/22 - Supine Sciatic Nerve Glide  - 1-2 x daily - 5-7 x weekly - Seated Hamstring Stretch  - 1-2 x daily - 7 x weekly - 3 reps - 30 hold  06/12/22 - Prone Alternating Arm and Leg Lifts  - 2 x daily - 7 x weekly - 2 sets - 10 reps - 3 second hold  05/16/22 Access Code: [email or web address] URL: [email or web address] Date: 05/16/2022 Prepared by: Josue Hector  06/05/22 Hip hinges with a dowel - 2 x daily - 7 x weekly - 2 sets - 10 reps Standing lumbar ext - 2 x daily - 7 x weekly - 2 sets - 10 reps  Exercises - Prone on Elbows Stretch  - 2 x daily - 7 x weekly - 1 sets - 2 reps - 2' hold - Supine Transversus Abdominis Bracing - Hands on Stomach  - 2 x daily - 7 x weekly - 1 sets - 10 reps - 5 second hold - Supine March  - 2 x daily - 7 x weekly - 2 sets - 10 reps - 5" hold - Supine Lower Trunk Rotation  - 2 x daily - 7 x weekly - 1 sets - 10 reps - 5 sec hold - Supine Piriformis Stretch with Foot on Ground  - 2 x daily - 7 x weekly - 1 sets - 3 reps - 30 second  hold - Supine Bridge  - 2 x daily - 7 x weekly - 2 sets - 10 reps - 5" hold - Sidelying Hip Abduction  - 2 x daily - 7 x weekly - 2 sets - 10 reps - 5" hold - Prone Hip Extension  - 2 x daily - 7 x weekly - 2 sets - 10 reps - 5" hold - Hip Abduction with Resistance Loop  - 2 x daily - 7 x weekly - 2 sets - 10 reps - Hip Extension with Resistance Loop  - 2 x daily - 7 x weekly - 2 sets - 10 reps  Date: 04/18/2022 Prepared by: Josue Hector  Exercises - Supine Transversus Abdominis Bracing - Hands on Stomach  - 3 x daily - 7 x weekly - 1 sets - 10 reps - 5 second hold - Clamshell  - 3 x daily - 7 x weekly - 1-2 sets - 10 reps - Supine Lower Trunk Rotation  - 3 x daily - 7 x weekly - 1 sets - 10 reps - 5 sec hold - Supine Gluteal Sets  - 3 x daily - 7 x weekly - 1 sets - 10 reps - 5 sec hold  04/20/22: Bridge and march with ab set  04/24/22: POE Prone hip extension Sidelying hip abduction  ASSESSMENT:  CLINICAL IMPRESSION: Tolerated all activities without worsening of symptoms. Demonstrated appropriate levels of fatigue. Required no amount of cueing to ensure correct execution of activity. Patient presents with significant improvement in ROM and strength. In addition, patient reports absence and elimination of pain. Even if there's no pain, patient showed some decline in function as manifested  in the FOTO score and 5x sit-to-stand. However, the regression in scores is not significant. With this, skilled PT is not indicated at the moment due to absence of pain, and improvement in ROM and strength. Patient can now be D/C to indep HEP.   OBJECTIVE IMPAIRMENTS: decreased ROM and pain.   ACTIVITY LIMITATIONS: carrying, lifting, bending, sitting, standing, squatting, stairs, transfers, and locomotion level  PARTICIPATION LIMITATIONS: meal prep, cleaning, laundry, shopping, community activity, and yard work  PERSONAL FACTORS: Age are also affecting patient's functional outcome.   REHAB  POTENTIAL: Good  CLINICAL DECISION MAKING: Stable/uncomplicated  EVALUATION COMPLEXITY: Low   GOALS: SHORT TERM GOALS: Target date: 05/09/2022  Patient will be independent with initial HEP and self-management strategies to improve functional outcomes Baseline:  Goal status: MET   LONG TERM GOALS: Target date: 05/30/2022  Patient will be independent with advanced HEP and self-management strategies to improve functional outcomes Baseline:  Goal status: MET  2.  Patient will improve FOTO score to predicted value to indicate improvement in functional outcomes Baseline: 75% function Goal status: previously MET  3.  Patient will report reduction of back pain to <3/10 for improved quality of life and ability to perform ADLs  Baseline: 0/10 Goal status: MET  4. Patient will have equal to or > 4+/5 MMT throughout BLE to improve ability to perform functional mobility, stair ambulation and ADLs.  Baseline: See MMT Goal status:  MET   PLAN:  PT FREQUENCY:  0  PT DURATION: other: 0  PLANNED INTERVENTIONS: Skilled PT not indicated at this time due to majority of goals being met. Patient is D/C to indep HEP   Chrissie Noa L. Ion Gonnella, PT, DPT, OCS Board-Certified Clinical Specialist in [place] # ([place]): [id number] T 4:55 PM, 06/26/22

## 2022-06-27 DIAGNOSIS — Z5181 Encounter for therapeutic drug level monitoring: Secondary | ICD-10-CM | POA: Diagnosis not present

## 2022-06-27 DIAGNOSIS — Z79899 Other long term (current) drug therapy: Secondary | ICD-10-CM | POA: Diagnosis not present

## 2022-06-27 DIAGNOSIS — L281 Prurigo nodularis: Secondary | ICD-10-CM | POA: Diagnosis not present

## 2022-07-03 DIAGNOSIS — M5416 Radiculopathy, lumbar region: Secondary | ICD-10-CM | POA: Diagnosis not present

## 2022-07-09 DIAGNOSIS — M5416 Radiculopathy, lumbar region: Secondary | ICD-10-CM | POA: Diagnosis not present

## 2022-07-16 ENCOUNTER — Other Ambulatory Visit (HOSPITAL_COMMUNITY)
Admission: RE | Admit: 2022-07-16 | Discharge: 2022-07-16 | Disposition: A | Discharge status: Home / Self Care | Payer: 59 | Source: Ambulatory Visit | Attending: Nephrology | Admitting: Nephrology

## 2022-07-16 DIAGNOSIS — J069 Acute upper respiratory infection, unspecified: Secondary | ICD-10-CM | POA: Diagnosis not present

## 2022-07-16 DIAGNOSIS — N185 Chronic kidney disease, stage 5: Secondary | ICD-10-CM | POA: Insufficient documentation

## 2022-07-16 DIAGNOSIS — R5383 Other fatigue: Secondary | ICD-10-CM | POA: Diagnosis not present

## 2022-07-16 DIAGNOSIS — J449 Chronic obstructive pulmonary disease, unspecified: Secondary | ICD-10-CM | POA: Diagnosis not present

## 2022-07-16 LAB — CBC
HCT: 36.9 % (ref 36.0–46.0)
Hemoglobin: 11.7 g/dL — ABNORMAL LOW (ref 12.0–15.0)
MCH: 31.6 pg (ref 26.0–34.0)
MCHC: 31.7 g/dL (ref 30.0–36.0)
MCV: 99.7 fL (ref 80.0–100.0)
Platelets: 239 10*3/uL (ref 150–400)
RBC: 3.7 MIL/uL — ABNORMAL LOW (ref 3.87–5.11)
RDW: 13.3 % (ref 11.5–15.5)
WBC: 9.9 10*3/uL (ref 4.0–10.5)
nRBC: 0 % (ref 0.0–0.2)

## 2022-07-16 LAB — RENAL FUNCTION PANEL
Albumin: 3.3 g/dL — ABNORMAL LOW (ref 3.5–5.0)
Anion gap: 8 (ref 5–15)
BUN: 32 mg/dL — ABNORMAL HIGH (ref 8–23)
CO2: 23 mmol/L (ref 22–32)
Calcium: 8.7 mg/dL — ABNORMAL LOW (ref 8.9–10.3)
Chloride: 107 mmol/L (ref 98–111)
Creatinine, Ser: 3.63 mg/dL — ABNORMAL HIGH (ref 0.44–1.00)
GFR, Estimated: 13 mL/min — ABNORMAL LOW (ref >60)
Glucose, Bld: 92 mg/dL (ref 70–99)
Phosphorus: 3.7 mg/dL (ref 2.5–4.6)
Potassium: 4.3 mmol/L (ref 3.5–5.1)
Sodium: 138 mmol/L (ref 135–145)

## 2022-07-16 LAB — PROTEIN / CREATININE RATIO, URINE
Creatinine, Urine: 65 mg/dL
Protein Creatinine Ratio: 0.82 mg/mg{Cre} — ABNORMAL HIGH (ref 0.00–0.15)
Total Protein, Urine: 53 mg/dL

## 2022-07-17 LAB — PTH, INTACT AND CALCIUM
Calcium, Total (PTH): 8.9 mg/dL (ref 8.7–10.3)
PTH: 81 pg/mL — ABNORMAL HIGH (ref 15–65)

## 2022-07-20 DIAGNOSIS — R809 Proteinuria, unspecified: Secondary | ICD-10-CM | POA: Diagnosis not present

## 2022-07-20 DIAGNOSIS — N185 Chronic kidney disease, stage 5: Secondary | ICD-10-CM | POA: Diagnosis not present

## 2022-07-20 DIAGNOSIS — E8722 Chronic metabolic acidosis: Secondary | ICD-10-CM | POA: Diagnosis not present

## 2022-07-20 DIAGNOSIS — E211 Secondary hyperparathyroidism, not elsewhere classified: Secondary | ICD-10-CM | POA: Diagnosis not present

## 2022-07-20 DIAGNOSIS — D631 Anemia in chronic kidney disease: Secondary | ICD-10-CM | POA: Diagnosis not present

## 2022-07-20 DIAGNOSIS — N032 Chronic nephritic syndrome with diffuse membranous glomerulonephritis: Secondary | ICD-10-CM | POA: Diagnosis not present

## 2022-07-20 DIAGNOSIS — I129 Hypertensive chronic kidney disease with stage 1 through stage 4 chronic kidney disease, or unspecified chronic kidney disease: Secondary | ICD-10-CM | POA: Diagnosis not present

## 2022-07-20 DIAGNOSIS — N189 Chronic kidney disease, unspecified: Secondary | ICD-10-CM | POA: Diagnosis not present

## 2022-07-30 ENCOUNTER — Ambulatory Visit: Payer: 59 | Admitting: Podiatry

## 2022-08-03 ENCOUNTER — Encounter (HOSPITAL_COMMUNITY): Payer: Self-pay

## 2022-08-03 NOTE — Therapy (Signed)
Entered in error. Please disregard this note  Chrissie Noa L. Zenon Leaf, PT, DPT, OCS Board-Certified Clinical Specialist in [place] # ([place]): [phone] T

## 2022-08-07 DIAGNOSIS — M5416 Radiculopathy, lumbar region: Secondary | ICD-10-CM | POA: Diagnosis not present

## 2022-08-07 DIAGNOSIS — M48062 Spinal stenosis, lumbar region with neurogenic claudication: Secondary | ICD-10-CM | POA: Diagnosis not present

## 2022-08-27 DIAGNOSIS — I509 Heart failure, unspecified: Secondary | ICD-10-CM | POA: Diagnosis not present

## 2022-08-27 DIAGNOSIS — Z299 Encounter for prophylactic measures, unspecified: Secondary | ICD-10-CM | POA: Diagnosis not present

## 2022-08-27 DIAGNOSIS — M8580 Other specified disorders of bone density and structure, unspecified site: Secondary | ICD-10-CM | POA: Diagnosis not present

## 2022-08-27 DIAGNOSIS — N185 Chronic kidney disease, stage 5: Secondary | ICD-10-CM | POA: Diagnosis not present

## 2022-08-27 DIAGNOSIS — L309 Dermatitis, unspecified: Secondary | ICD-10-CM | POA: Diagnosis not present

## 2022-09-10 ENCOUNTER — Ambulatory Visit: Payer: 59 | Admitting: Podiatry

## 2022-09-10 DIAGNOSIS — B353 Tinea pedis: Secondary | ICD-10-CM

## 2022-09-10 MED ORDER — CLOTRIMAZOLE-BETAMETHASONE 1-0.05 % EX CREA: 1.0000 | TOPICAL_CREAM | Freq: Two times a day (BID) | CUTANEOUS | 3 refills | Status: DC

## 2022-09-10 NOTE — Progress Notes (Signed)
Chief Complaint  Patient presents with   Tinea Pedis    Patient came in today for left foot tinea pedis follow-up, in the arch of the foot, patient is still having itching and dry skin, TX: Lotrisone cream (doesn't seem to help)     HPI: 67 y.o. female presenting today for follow up evaluation of itching with thick skin to the right great toe as well as the plantar arch of the left foot.  She has been applying the lotrisone cream twice daily with modest improvement.  She continues to have some itching however to the foot with sensitivity.  Past Medical History:  Diagnosis Date   Absolute anemia 11/10/2015   Anemia    Anemia of renal disease 11/10/2015   Asthma    has Albuterol inhaler prn   CHF (congestive heart failure) ([facility])    acute systolic CHF 12/2011 [facility])   Chronic kidney disease    Membranous nephropathy ( Seen at [facility])   Endometrial adenocarcinoma ([facility])    Gastric ulcer    History of blood transfusion    no abnormal reaction noted   History of bronchitis    last time 3-30yrs ago   Hyperlipidemia    takes Pravastatin daily   Hypertension    takes Imdur and Hydralazine daily as well as Coreg   Hypothyroidism    takes Synthroid daily   Itching    on legs-has a Kenalog cream   Metabolic bone disease    Nausea    takes Zofran prn   Nonischemic cardiomyopathy ([facility])    12/2011 normal coronaries, EF 22% (EF normal 04/2013)   Obesity    Peripheral edema    takes Torsemide daily   Thyroid disease    hypothyroidism    Past Surgical History:  Procedure Laterality Date   AV FISTULA PLACEMENT Left 07/22/2012   Procedure: INSERTION OF ARTERIOVENOUS GORE-TEX GRAFT ARM;  Surgeon: Chuck Hint, MD;  Location: [facility];  Service: Vascular;  Laterality: Left;   AV FISTULA PLACEMENT Right 05/24/2017   Procedure: INSERTION OF ARTERIOVENOUS (AV) GORE-TEX GRAFT RIGHT ARM;  Surgeon: Chuck Hint, MD;  [facility];  Service: Vascular;  Laterality: Right;    CESAREAN SECTION     30+yrs ago   COLONOSCOPY     DILATION AND CURETTAGE, DIAGNOSTIC / THERAPEUTIC     left arm surgery with pin     15+yrs ago   ROBOTIC ASSISTED TOTAL HYSTERECTOMY WITH BILATERAL SALPINGO OOPHERECTOMY Bilateral 09/24/2019   Procedure: XI ROBOTIC ASSISTED TOTAL HYSTERECTOMY WITH BILATERAL SALPINGO OOPHORECTOMY;  Surgeon: Carver Fila, MD;  [facility];  Service: Gynecology;  Laterality: Bilateral;   SENTINEL NODE BIOPSY N/A 09/24/2019   Procedure: SENTINEL NODE BIOPSY;  Surgeon: Carver Fila, MD;  [facility];  Service: Gynecology;  Laterality: N/A;   TUBAL LIGATION      Allergies  Allergen Reactions   Corticosteroids Hives, Itching, Nausea And Vomiting and Rash    Tolerated dexamethasone when being treated for a reaction to rituximab   Other Hives, Itching, Nausea And Vomiting and Rash    Tolerated dexamethasone when being treated for a reaction to rituximab Tolerated dexamethasone when being treated for a reaction to rituximab  Tolerated dexamethasone when being treated for a reaction to rituximab   Rituximab Shortness Of Breath    wheezing wheezing      LT foot 09/10/2022  Physical Exam: General: The patient is alert and oriented x3 in no acute distress.  Dermatology:  Diffuse hyperkeratosis of skin with flaking along the plantar arch of the left foot with pruritus.  No open wounds noted.  Remaining integument unremarkable  Vascular: Palpable pedal pulses bilaterally. Capillary refill within normal limits.  Negative for any significant edema or erythema  Neurological: Grossly intact via light touch  Musculoskeletal Exam: No pedal deformities noted   Assessment: 1.  Tinea pedis left plantar arch -Patient evaluated -Continue Lotrisone cream 2 times daily.  Refill provided -Continue good foot hygiene and keeping her feet dry.  Advised against going barefoot -Return to clinic as needed     Felecia Shelling, DPM [facility]  Dr. Felecia Shelling, DPM    [address] [place], [place] [address]                Office [phone]  Fax [phone]

## 2022-09-18 DIAGNOSIS — Z299 Encounter for prophylactic measures, unspecified: Secondary | ICD-10-CM | POA: Diagnosis not present

## 2022-09-18 DIAGNOSIS — I10 Essential (primary) hypertension: Secondary | ICD-10-CM | POA: Diagnosis not present

## 2022-09-18 DIAGNOSIS — Z7189 Other specified counseling: Secondary | ICD-10-CM | POA: Diagnosis not present

## 2022-09-18 DIAGNOSIS — Z0000 Encounter for general adult medical examination without abnormal findings: Secondary | ICD-10-CM | POA: Diagnosis not present

## 2022-09-18 DIAGNOSIS — F17210 Nicotine dependence, cigarettes, uncomplicated: Secondary | ICD-10-CM | POA: Diagnosis not present

## 2022-09-18 DIAGNOSIS — I739 Peripheral vascular disease, unspecified: Secondary | ICD-10-CM | POA: Diagnosis not present

## 2022-09-18 DIAGNOSIS — I25119 Atherosclerotic heart disease of native coronary artery with unspecified angina pectoris: Secondary | ICD-10-CM | POA: Diagnosis not present

## 2022-09-24 ENCOUNTER — Other Ambulatory Visit (HOSPITAL_COMMUNITY)
Admission: RE | Admit: 2022-09-24 | Discharge: 2022-09-24 | Disposition: A | Discharge status: Home / Self Care | Payer: 59 | Source: Ambulatory Visit | Attending: Nephrology | Admitting: Nephrology

## 2022-09-24 DIAGNOSIS — N185 Chronic kidney disease, stage 5: Secondary | ICD-10-CM | POA: Insufficient documentation

## 2022-09-24 LAB — VITAMIN D 25 HYDROXY (VIT D DEFICIENCY, FRACTURES): Vit D, 25-Hydroxy: 47.44 ng/mL (ref 30–100)

## 2022-09-27 DIAGNOSIS — N185 Chronic kidney disease, stage 5: Secondary | ICD-10-CM | POA: Diagnosis not present

## 2022-09-27 DIAGNOSIS — I5032 Chronic diastolic (congestive) heart failure: Secondary | ICD-10-CM | POA: Diagnosis not present

## 2022-09-27 DIAGNOSIS — N032 Chronic nephritic syndrome with diffuse membranous glomerulonephritis: Secondary | ICD-10-CM | POA: Diagnosis not present

## 2022-09-27 DIAGNOSIS — I129 Hypertensive chronic kidney disease with stage 1 through stage 4 chronic kidney disease, or unspecified chronic kidney disease: Secondary | ICD-10-CM | POA: Diagnosis not present

## 2022-10-24 ENCOUNTER — Telehealth: Payer: Self-pay | Admitting: *Deleted

## 2022-10-24 NOTE — Telephone Encounter (Signed)
Patient called back to confirm her appt for 6/14

## 2022-10-24 NOTE — Telephone Encounter (Signed)
Attempted to return the patient's call with no answer, LMOM for the patient to call the office back

## 2022-10-25 ENCOUNTER — Encounter: Payer: Self-pay | Admitting: Gynecologic Oncology

## 2022-10-26 ENCOUNTER — Telehealth: Payer: Self-pay | Admitting: *Deleted

## 2022-10-26 ENCOUNTER — Inpatient Hospital Stay: Payer: 59 | Admitting: Gynecologic Oncology

## 2022-10-26 DIAGNOSIS — C541 Malignant neoplasm of endometrium: Secondary | ICD-10-CM

## 2022-10-26 NOTE — Telephone Encounter (Signed)
Patient called and moved appt due to transportation 6/14 to 7/19

## 2022-10-27 NOTE — Progress Notes (Unsigned)
Patient rescheduled appt day of

## 2022-10-30 ENCOUNTER — Other Ambulatory Visit (HOSPITAL_COMMUNITY)
Admission: RE | Admit: 2022-10-30 | Discharge: 2022-10-30 | Disposition: A | Discharge status: Home / Self Care | Payer: 59 | Source: Ambulatory Visit | Attending: Nephrology | Admitting: Nephrology

## 2022-10-30 DIAGNOSIS — Z6839 Body mass index (BMI) 39.0-39.9, adult: Secondary | ICD-10-CM | POA: Insufficient documentation

## 2022-10-30 DIAGNOSIS — N185 Chronic kidney disease, stage 5: Secondary | ICD-10-CM | POA: Insufficient documentation

## 2022-10-30 DIAGNOSIS — D631 Anemia in chronic kidney disease: Secondary | ICD-10-CM | POA: Diagnosis not present

## 2022-10-30 DIAGNOSIS — I120 Hypertensive chronic kidney disease with stage 5 chronic kidney disease or end stage renal disease: Secondary | ICD-10-CM | POA: Diagnosis not present

## 2022-10-30 DIAGNOSIS — R808 Other proteinuria: Secondary | ICD-10-CM | POA: Diagnosis not present

## 2022-10-30 DIAGNOSIS — E211 Secondary hyperparathyroidism, not elsewhere classified: Secondary | ICD-10-CM | POA: Insufficient documentation

## 2022-10-30 LAB — CBC WITH DIFFERENTIAL/PLATELET
Abs Immature Granulocytes: 0.03 10*3/uL (ref 0.00–0.07)
Basophils Absolute: 0 10*3/uL (ref 0.0–0.1)
Basophils Relative: 0 %
Eosinophils Absolute: 0.3 10*3/uL (ref 0.0–0.5)
Eosinophils Relative: 4 %
HCT: 37.8 % (ref 36.0–46.0)
Hemoglobin: 11.6 g/dL — ABNORMAL LOW (ref 12.0–15.0)
Immature Granulocytes: 0 %
Lymphocytes Relative: 23 %
Lymphs Abs: 1.7 10*3/uL (ref 0.7–4.0)
MCH: 31.1 pg (ref 26.0–34.0)
MCHC: 30.7 g/dL (ref 30.0–36.0)
MCV: 101.3 fL — ABNORMAL HIGH (ref 80.0–100.0)
Monocytes Absolute: 0.4 10*3/uL (ref 0.1–1.0)
Monocytes Relative: 5 %
Neutro Abs: 5 10*3/uL (ref 1.7–7.7)
Neutrophils Relative %: 68 %
Platelets: 241 10*3/uL (ref 150–400)
RBC: 3.73 MIL/uL — ABNORMAL LOW (ref 3.87–5.11)
RDW: 13.5 % (ref 11.5–15.5)
WBC: 7.4 10*3/uL (ref 4.0–10.5)
nRBC: 0 % (ref 0.0–0.2)

## 2022-10-30 LAB — IRON AND TIBC
Iron: 61 ug/dL (ref 28–170)
Saturation Ratios: 24 % (ref 10.4–31.8)
TIBC: 259 ug/dL (ref 250–450)
UIBC: 198 ug/dL

## 2022-10-30 LAB — RENAL FUNCTION PANEL
Albumin: 3.7 g/dL (ref 3.5–5.0)
Anion gap: 9 (ref 5–15)
BUN: 31 mg/dL — ABNORMAL HIGH (ref 8–23)
CO2: 23 mmol/L (ref 22–32)
Calcium: 8.8 mg/dL — ABNORMAL LOW (ref 8.9–10.3)
Chloride: 107 mmol/L (ref 98–111)
Creatinine, Ser: 3.93 mg/dL — ABNORMAL HIGH (ref 0.44–1.00)
GFR, Estimated: 12 mL/min — ABNORMAL LOW (ref >60)
Glucose, Bld: 105 mg/dL — ABNORMAL HIGH (ref 70–99)
Phosphorus: 3.7 mg/dL (ref 2.5–4.6)
Potassium: 4.3 mmol/L (ref 3.5–5.1)
Sodium: 139 mmol/L (ref 135–145)

## 2022-10-30 LAB — PROTEIN / CREATININE RATIO, URINE
Creatinine, Urine: 123 mg/dL
Protein Creatinine Ratio: 0.75 mg/mg{Cre} — ABNORMAL HIGH (ref 0.00–0.15)
Total Protein, Urine: 92 mg/dL

## 2022-10-30 LAB — FERRITIN: Ferritin: 221 ng/mL (ref 11–307)

## 2022-11-02 DIAGNOSIS — N185 Chronic kidney disease, stage 5: Secondary | ICD-10-CM | POA: Diagnosis not present

## 2022-11-02 DIAGNOSIS — N032 Chronic nephritic syndrome with diffuse membranous glomerulonephritis: Secondary | ICD-10-CM | POA: Diagnosis not present

## 2022-11-02 DIAGNOSIS — D631 Anemia in chronic kidney disease: Secondary | ICD-10-CM | POA: Diagnosis not present

## 2022-11-02 DIAGNOSIS — I129 Hypertensive chronic kidney disease with stage 1 through stage 4 chronic kidney disease, or unspecified chronic kidney disease: Secondary | ICD-10-CM | POA: Diagnosis not present

## 2022-11-16 DIAGNOSIS — M7989 Other specified soft tissue disorders: Secondary | ICD-10-CM | POA: Diagnosis not present

## 2022-11-16 DIAGNOSIS — B372 Candidiasis of skin and nail: Secondary | ICD-10-CM | POA: Diagnosis not present

## 2022-11-16 DIAGNOSIS — J449 Chronic obstructive pulmonary disease, unspecified: Secondary | ICD-10-CM | POA: Diagnosis not present

## 2022-11-16 DIAGNOSIS — I10 Essential (primary) hypertension: Secondary | ICD-10-CM | POA: Diagnosis not present

## 2022-11-16 DIAGNOSIS — L732 Hidradenitis suppurativa: Secondary | ICD-10-CM | POA: Diagnosis not present

## 2022-11-16 DIAGNOSIS — Z299 Encounter for prophylactic measures, unspecified: Secondary | ICD-10-CM | POA: Diagnosis not present

## 2022-11-19 DIAGNOSIS — M7989 Other specified soft tissue disorders: Secondary | ICD-10-CM | POA: Diagnosis not present

## 2022-11-23 DIAGNOSIS — Z299 Encounter for prophylactic measures, unspecified: Secondary | ICD-10-CM | POA: Diagnosis not present

## 2022-11-23 DIAGNOSIS — I10 Essential (primary) hypertension: Secondary | ICD-10-CM | POA: Diagnosis not present

## 2022-11-23 DIAGNOSIS — I509 Heart failure, unspecified: Secondary | ICD-10-CM | POA: Diagnosis not present

## 2022-11-23 DIAGNOSIS — L732 Hidradenitis suppurativa: Secondary | ICD-10-CM | POA: Diagnosis not present

## 2022-11-30 ENCOUNTER — Encounter: Payer: Self-pay | Admitting: Gynecologic Oncology

## 2022-11-30 ENCOUNTER — Other Ambulatory Visit: Payer: Self-pay

## 2022-11-30 ENCOUNTER — Inpatient Hospital Stay: Payer: 59 | Attending: Gynecologic Oncology | Admitting: Gynecologic Oncology

## 2022-11-30 VITALS — BP 121/66 | HR 75 | Temp 98.5°F | Resp 16 | Wt 227.4 lb

## 2022-11-30 DIAGNOSIS — Z90710 Acquired absence of both cervix and uterus: Secondary | ICD-10-CM | POA: Insufficient documentation

## 2022-11-30 DIAGNOSIS — N952 Postmenopausal atrophic vaginitis: Secondary | ICD-10-CM | POA: Insufficient documentation

## 2022-11-30 DIAGNOSIS — Z90722 Acquired absence of ovaries, bilateral: Secondary | ICD-10-CM | POA: Insufficient documentation

## 2022-11-30 DIAGNOSIS — Z8542 Personal history of malignant neoplasm of other parts of uterus: Secondary | ICD-10-CM | POA: Diagnosis present

## 2022-11-30 DIAGNOSIS — Z79890 Hormone replacement therapy (postmenopausal): Secondary | ICD-10-CM | POA: Insufficient documentation

## 2022-11-30 DIAGNOSIS — C541 Malignant neoplasm of endometrium: Secondary | ICD-10-CM

## 2022-11-30 MED ORDER — ESTROGENS CONJUGATED 0.625 MG/GM VA CREA: 1.0000 | TOPICAL_CREAM | VAGINAL | 1 refills | Status: AC

## 2022-11-30 NOTE — Progress Notes (Signed)
Gynecologic Oncology Return Clinic Visit  11/30/22  Reason for Visit: surveillance  Treatment History: Oncology History Overview Note  MSI-stable   Endometrial/uterine adenocarcinoma (HCC)  08/13/2019 Initial Biopsy   CKC and D&C - CKC negativ, EMB showed FIGO gr1 EMCA   08/13/2019 Initial Diagnosis   Endometrial/uterine adenocarcinoma (HCC)   09/24/2019 Surgery   TRH/BSO, SLN   09/24/2019 Pathology Results   IA, grade 1, endometrioid adenoca; DOI 1/52mm, no LVSI, SLNs negative   09/24/2019 Cancer Staging   Staging form: Corpus Uteri - Carcinoma and Carcinosarcoma, AJCC 8th Edition - Clinical stage from 09/24/2019: FIGO Stage IA (cT1a, cN0(sn), cM0) - Signed by Carver Fila, MD on 09/29/2019     Interval History: Doing well. Denies any vaginal bleeding, pelvic or abdominal pain. Reports baseline bowel and bladder function. Endorses some vulvar and vaginal dryness, mild discomfort intermittently related to this.  Past Medical/Surgical History: Past Medical History:  Diagnosis Date   Absolute anemia 11/10/2015   Anemia    Anemia of renal disease 11/10/2015   Asthma    has Albuterol inhaler prn   CHF (congestive heart failure) (HCC)    acute systolic CHF 12/2011 [facility])   Chronic kidney disease    Membranous nephropathy ( Seen at [facility])   Endometrial adenocarcinoma (HCC)    Gastric ulcer    History of blood transfusion    no abnormal reaction noted   History of bronchitis    last time 3-64yrs ago   Hyperlipidemia    takes Pravastatin daily   Hypertension    takes Imdur and Hydralazine daily as well as Coreg   Hypothyroidism    takes Synthroid daily   Itching    on legs-has a Kenalog cream   Metabolic bone disease    Nausea    takes Zofran prn   Nonischemic cardiomyopathy (HCC)    12/2011 normal coronaries, EF 22% (EF normal 04/2013)   Obesity    Peripheral edema    takes Torsemide daily   Thyroid disease    hypothyroidism    Past Surgical History:   Procedure Laterality Date   AV FISTULA PLACEMENT Left 07/22/2012   Procedure: INSERTION OF ARTERIOVENOUS GORE-TEX GRAFT ARM;  Surgeon: Chuck Hint, MD;  Location: [facility] OR;  Service: Vascular;  Laterality: Left;   AV FISTULA PLACEMENT Right 05/24/2017   Procedure: INSERTION OF ARTERIOVENOUS (AV) GORE-TEX GRAFT RIGHT ARM;  Surgeon: Chuck Hint, MD;  Location: [facility] OR;  Service: Vascular;  Laterality: Right;   CESAREAN SECTION     30+yrs ago   COLONOSCOPY     DILATION AND CURETTAGE, DIAGNOSTIC / THERAPEUTIC     left arm surgery with pin     15+yrs ago   ROBOTIC ASSISTED TOTAL HYSTERECTOMY WITH BILATERAL SALPINGO OOPHERECTOMY Bilateral 09/24/2019   Procedure: XI ROBOTIC ASSISTED TOTAL HYSTERECTOMY WITH BILATERAL SALPINGO OOPHORECTOMY;  Surgeon: Carver Fila, MD;  Location: [facility] ORS;  Service: Gynecology;  Laterality: Bilateral;   SENTINEL NODE BIOPSY N/A 09/24/2019   Procedure: SENTINEL NODE BIOPSY;  Surgeon: Carver Fila, MD;  Location: [facility] ORS;  Service: Gynecology;  Laterality: N/A;   TUBAL LIGATION      Family History  Problem Relation Age of Onset   Kidney disease Other    Diabetes Mother    Breast cancer Neg Hx    Colon cancer Neg Hx    Uterine cancer Neg Hx    Ovarian cancer Neg Hx     Social History   Socioeconomic History  Marital status: Single    Spouse name: Not on file   Number of children: Not on file   Years of education: Not on file   Highest education level: Not on file  Occupational History   Not on file  Tobacco Use   Smoking status: Every Day    Current packs/day: 1.00    Average packs/day: 1 pack/day for 54.5 years (54.5 ttl pk-yrs)    Types: Cigarettes    Start date: 05/14/1968   Smokeless tobacco: Never  Vaping Use   Vaping status: Never Used  Substance and Sexual Activity   Alcohol use: No   Drug use: No   Sexual activity: Not Currently  Other Topics Concern   Not on file  Social History Narrative   Not on file    Social Determinants of Health   Financial Resource Strain: Not on file  Food Insecurity: No Food Insecurity (02/06/2022)   Received from [facility], [facility]   Hunger Vital Sign    Worried About Running Out of Food in the Last Year: Never true    Ran Out of Food in the Last Year: Never true  Transportation Needs: No Transportation Needs (08/04/2019)   Received from [facility], [facility]   PRAPARE - Transportation    Lack of Transportation (Medical): No    Lack of Transportation (Non-Medical): No  Physical Activity: Not on file  Stress: No Stress Concern Present (08/04/2019)   Received from [facility], [facility] of Occupational Health - Occupational Stress Questionnaire    Feeling of Stress : Not at all  Social Connections: Unknown (01/26/2022)   Received from [facility], [facility]   Social Network    Social Network: Not on file    Current Medications:  Current Outpatient Medications:    acetaminophen (TYLENOL) 500 MG tablet, Take 1,000 mg by mouth daily as needed for moderate pain or headache., Disp: , Rfl:    albuterol (PROVENTIL HFA;VENTOLIN HFA) 108 (90 BASE) MCG/ACT inhaler, Inhale 1-2 puffs into the lungs every 6 (six) hours as needed for wheezing. , Disp: , Rfl:    carvedilol (COREG) 12.5 MG tablet, Take 12.5 mg by mouth 2 (two) times daily., Disp: , Rfl:    DUPIXENT 300 MG/2ML SOPN, Inject into the skin., Disp: , Rfl:    hydrALAZINE (APRESOLINE) 50 MG tablet, Take 50 mg by mouth 3 (three) times daily. , Disp: , Rfl:    isosorbide mononitrate (IMDUR) 30 MG 24 hr tablet, Take 30 mg by mouth daily., Disp: , Rfl:    metolazone (ZAROXOLYN) 5 MG tablet, Take 5 mg by mouth 2 (two) times daily as needed (swelling). , Disp: , Rfl:    pravastatin (PRAVACHOL) 80 MG tablet, Take 80 mg by mouth daily., Disp: , Rfl:    raloxifene (EVISTA) 60 MG tablet, Take 60 mg by mouth daily., Disp: , Rfl:    RAYALDEE 30 MCG CPCR, Take 30 mcg by  mouth daily. , Disp: , Rfl:    sodium bicarbonate 650 MG tablet, Take 1,300 mg by mouth 3 (three) times daily. , Disp: , Rfl:    torsemide (DEMADEX) 20 MG tablet, Take 20 mg by mouth daily as needed (swelling)., Disp: , Rfl:    triamcinolone ointment (KENALOG) 0.1 %, Apply 1 Application topically., Disp: , Rfl:   Review of Systems: Denies appetite changes, fevers, chills, fatigue, unexplained weight changes. Denies hearing loss, neck lumps or masses, mouth sores, ringing in  ears or voice changes. Denies cough or wheezing.  Denies shortness of breath. Denies chest pain or palpitations. Denies leg swelling. Denies abdominal distention, pain, blood in stools, constipation, diarrhea, nausea, vomiting, or early satiety. Denies pain with intercourse, dysuria, frequency, hematuria or incontinence. Denies hot flashes, pelvic pain, vaginal bleeding or vaginal discharge.   Denies joint pain, back pain or muscle pain/cramps. Denies itching, rash, or wounds. Denies dizziness, headaches, numbness or seizures. Denies swollen lymph nodes or glands, denies easy bruising or bleeding. Denies anxiety, depression, confusion, or decreased concentration.  Physical Exam: BP 121/66 (BP Location: Left Arm, Patient Position: Sitting)   Pulse 75   Temp 98.5 F (36.9 C) (Oral)   Resp 16   Wt 227 lb 6.4 oz (103.1 kg)   SpO2 99%   BMI 40.28 kg/m  General: Alert, oriented, no acute distress. HEENT: Normocephalic, atraumatic, sclera anicteric. Chest: Unlabored breathing on room air. Abdomen: Obese, soft, nontender.  Normoactive bowel sounds.  No masses or hepatosplenomegaly appreciated.  Well-healed laparoscopic incisions. Extremities: Grossly normal range of motion.  Warm, well perfused.  Trace edema bilaterally. Lymphatics: No cervical, supraclavicular, or inguinal adenopathy. GU: Normal appearing external genitalia without erythema, excoriation, or lesions.  Speculum exam reveals mildly atrophic vaginal  mucosa, no lesions or masses, no bleeding or discharge.  Bimanual exam reveals cuff intact, no masses or nodularity.  Rectovaginal exam confirms these findings.  Laboratory & Radiologic Studies: None new  Assessment & Plan: Shannon [name] is a 67 y.o. woman with Stage 1A2, grade 1 low risk, early stage endometrioid adenocarcinoma who presents for surveillance. Definitive surgery 09/2019. MMR intact, MS stable.   The patient is doing well and is NED on exam today.  The patient and I reviewed NCCN and SGO surveillance guidelines recommendations. I will plan to see the patient yearly.  She will call after the new year to get a visit scheduled with me.  I have encouraged her to reach out to her OB/GYN for a visit at the end of the year.  She alternates visits between the 2 of [place], she will be reestablished with her OB/GYN when she is ready for discharge from our clinic. We discussed the signs and symptoms that would be concerning for disease recurrence that should prompt a phone call prior to her next visit.  Prescription sent for vaginal estrogen to patient's pharmacy for vaginal atrophy. Also discussed using coconut oil.  20 minutes of total time was spent for this patient encounter, including preparation, face-to-face counseling with the patient and coordination of care, and documentation of the encounter.  Eugene Garnet, MD  Division of Gynecologic Oncology  Department of Obstetrics and Gynecology  [facility]

## 2022-11-30 NOTE — Patient Instructions (Addendum)
It was good to see you today.  I do not see or feel any evidence of cancer recurrence on your exam.  I will see you for follow-up in 12 months. Please call after the new year to get this scheduled in 11/2022.  Please call your OBGYN for a follow-up visit in 6 months. If you have trouble getting this scheduled, please call back for an appointment with Melissa in 6 months.   As always, if you develop any new and concerning symptoms before your next visit, please call to see me sooner.  I am sending in a prescription for vaginal estrogen (I sent this to your mail order pharmacy). You can also use coconut or apricot oil.

## 2022-12-25 DIAGNOSIS — Z0000 Encounter for general adult medical examination without abnormal findings: Secondary | ICD-10-CM | POA: Diagnosis not present

## 2022-12-25 DIAGNOSIS — E7800 Pure hypercholesterolemia, unspecified: Secondary | ICD-10-CM | POA: Diagnosis not present

## 2022-12-25 DIAGNOSIS — Z299 Encounter for prophylactic measures, unspecified: Secondary | ICD-10-CM | POA: Diagnosis not present

## 2022-12-25 DIAGNOSIS — F17210 Nicotine dependence, cigarettes, uncomplicated: Secondary | ICD-10-CM | POA: Diagnosis not present

## 2022-12-25 DIAGNOSIS — R5383 Other fatigue: Secondary | ICD-10-CM | POA: Diagnosis not present

## 2022-12-25 DIAGNOSIS — Z79899 Other long term (current) drug therapy: Secondary | ICD-10-CM | POA: Diagnosis not present

## 2022-12-25 DIAGNOSIS — I10 Essential (primary) hypertension: Secondary | ICD-10-CM | POA: Diagnosis not present

## 2023-01-10 ENCOUNTER — Other Ambulatory Visit (HOSPITAL_COMMUNITY)
Admission: RE | Admit: 2023-01-10 | Discharge: 2023-01-10 | Disposition: A | Discharge status: Home / Self Care | Payer: 59 | Source: Ambulatory Visit | Attending: Nephrology | Admitting: Nephrology

## 2023-01-10 DIAGNOSIS — N185 Chronic kidney disease, stage 5: Secondary | ICD-10-CM | POA: Insufficient documentation

## 2023-01-10 DIAGNOSIS — I129 Hypertensive chronic kidney disease with stage 1 through stage 4 chronic kidney disease, or unspecified chronic kidney disease: Secondary | ICD-10-CM | POA: Diagnosis not present

## 2023-01-10 DIAGNOSIS — D631 Anemia in chronic kidney disease: Secondary | ICD-10-CM | POA: Insufficient documentation

## 2023-01-10 DIAGNOSIS — E211 Secondary hyperparathyroidism, not elsewhere classified: Secondary | ICD-10-CM | POA: Diagnosis not present

## 2023-01-10 DIAGNOSIS — N032 Chronic nephritic syndrome with diffuse membranous glomerulonephritis: Secondary | ICD-10-CM | POA: Insufficient documentation

## 2023-01-10 DIAGNOSIS — R808 Other proteinuria: Secondary | ICD-10-CM | POA: Insufficient documentation

## 2023-01-10 LAB — CBC WITH DIFFERENTIAL/PLATELET
Abs Immature Granulocytes: 0.02 10*3/uL (ref 0.00–0.07)
Basophils Absolute: 0 10*3/uL (ref 0.0–0.1)
Basophils Relative: 0 %
Eosinophils Absolute: 0.3 10*3/uL (ref 0.0–0.5)
Eosinophils Relative: 4 %
HCT: 34.8 % — ABNORMAL LOW (ref 36.0–46.0)
Hemoglobin: 10.9 g/dL — ABNORMAL LOW (ref 12.0–15.0)
Immature Granulocytes: 0 %
Lymphocytes Relative: 21 %
Lymphs Abs: 1.7 10*3/uL (ref 0.7–4.0)
MCH: 31.2 pg (ref 26.0–34.0)
MCHC: 31.3 g/dL (ref 30.0–36.0)
MCV: 99.7 fL (ref 80.0–100.0)
Monocytes Absolute: 0.4 10*3/uL (ref 0.1–1.0)
Monocytes Relative: 5 %
Neutro Abs: 5.6 10*3/uL (ref 1.7–7.7)
Neutrophils Relative %: 70 %
Platelets: 226 10*3/uL (ref 150–400)
RBC: 3.49 MIL/uL — ABNORMAL LOW (ref 3.87–5.11)
RDW: 13.6 % (ref 11.5–15.5)
WBC: 8.1 10*3/uL (ref 4.0–10.5)
nRBC: 0 % (ref 0.0–0.2)

## 2023-01-10 LAB — RENAL FUNCTION PANEL
Albumin: 3.6 g/dL (ref 3.5–5.0)
Anion gap: 11 (ref 5–15)
BUN: 41 mg/dL — ABNORMAL HIGH (ref 8–23)
CO2: 21 mmol/L — ABNORMAL LOW (ref 22–32)
Calcium: 8.8 mg/dL — ABNORMAL LOW (ref 8.9–10.3)
Chloride: 108 mmol/L (ref 98–111)
Creatinine, Ser: 3.72 mg/dL — ABNORMAL HIGH (ref 0.44–1.00)
GFR, Estimated: 13 mL/min — ABNORMAL LOW (ref >60)
Glucose, Bld: 95 mg/dL (ref 70–99)
Phosphorus: 3.4 mg/dL (ref 2.5–4.6)
Potassium: 3.7 mmol/L (ref 3.5–5.1)
Sodium: 140 mmol/L (ref 135–145)

## 2023-01-10 LAB — PROTEIN / CREATININE RATIO, URINE
Creatinine, Urine: 91 mg/dL
Protein Creatinine Ratio: 0.65 mg/mg{Cre} — ABNORMAL HIGH (ref 0.00–0.15)
Total Protein, Urine: 59 mg/dL

## 2023-01-15 DIAGNOSIS — L810 Postinflammatory hyperpigmentation: Secondary | ICD-10-CM | POA: Diagnosis not present

## 2023-01-15 DIAGNOSIS — L281 Prurigo nodularis: Secondary | ICD-10-CM | POA: Diagnosis not present

## 2023-01-16 DIAGNOSIS — I129 Hypertensive chronic kidney disease with stage 1 through stage 4 chronic kidney disease, or unspecified chronic kidney disease: Secondary | ICD-10-CM | POA: Diagnosis not present

## 2023-01-16 DIAGNOSIS — N032 Chronic nephritic syndrome with diffuse membranous glomerulonephritis: Secondary | ICD-10-CM | POA: Diagnosis not present

## 2023-01-16 DIAGNOSIS — D631 Anemia in chronic kidney disease: Secondary | ICD-10-CM | POA: Diagnosis not present

## 2023-01-16 DIAGNOSIS — N185 Chronic kidney disease, stage 5: Secondary | ICD-10-CM | POA: Diagnosis not present

## 2023-01-30 DIAGNOSIS — M779 Enthesopathy, unspecified: Secondary | ICD-10-CM | POA: Diagnosis not present

## 2023-01-30 DIAGNOSIS — Z299 Encounter for prophylactic measures, unspecified: Secondary | ICD-10-CM | POA: Diagnosis not present

## 2023-01-30 DIAGNOSIS — Z23 Encounter for immunization: Secondary | ICD-10-CM | POA: Diagnosis not present

## 2023-01-30 DIAGNOSIS — I10 Essential (primary) hypertension: Secondary | ICD-10-CM | POA: Diagnosis not present

## 2023-01-30 DIAGNOSIS — F17210 Nicotine dependence, cigarettes, uncomplicated: Secondary | ICD-10-CM | POA: Diagnosis not present

## 2023-03-15 ENCOUNTER — Other Ambulatory Visit: Payer: Self-pay | Admitting: Internal Medicine

## 2023-03-15 DIAGNOSIS — Z1231 Encounter for screening mammogram for malignant neoplasm of breast: Secondary | ICD-10-CM

## 2023-04-18 ENCOUNTER — Other Ambulatory Visit (HOSPITAL_COMMUNITY)
Admission: RE | Admit: 2023-04-18 | Discharge: 2023-04-18 | Disposition: A | Discharge status: Home / Self Care | Payer: 59 | Source: Ambulatory Visit | Attending: Nephrology | Admitting: Nephrology

## 2023-04-18 DIAGNOSIS — D631 Anemia in chronic kidney disease: Secondary | ICD-10-CM | POA: Insufficient documentation

## 2023-04-18 DIAGNOSIS — E211 Secondary hyperparathyroidism, not elsewhere classified: Secondary | ICD-10-CM | POA: Diagnosis not present

## 2023-04-18 DIAGNOSIS — R808 Other proteinuria: Secondary | ICD-10-CM | POA: Diagnosis not present

## 2023-04-18 DIAGNOSIS — N185 Chronic kidney disease, stage 5: Secondary | ICD-10-CM | POA: Insufficient documentation

## 2023-04-18 LAB — CBC
HCT: 35.2 % — ABNORMAL LOW (ref 36.0–46.0)
Hemoglobin: 11.2 g/dL — ABNORMAL LOW (ref 12.0–15.0)
MCH: 31.4 pg (ref 26.0–34.0)
MCHC: 31.8 g/dL (ref 30.0–36.0)
MCV: 98.6 fL (ref 80.0–100.0)
Platelets: 223 10*3/uL (ref 150–400)
RBC: 3.57 MIL/uL — ABNORMAL LOW (ref 3.87–5.11)
RDW: 13.7 % (ref 11.5–15.5)
WBC: 7 10*3/uL (ref 4.0–10.5)
nRBC: 0 % (ref 0.0–0.2)

## 2023-04-18 LAB — RENAL FUNCTION PANEL
Albumin: 3.6 g/dL (ref 3.5–5.0)
Anion gap: 9 (ref 5–15)
BUN: 39 mg/dL — ABNORMAL HIGH (ref 8–23)
CO2: 21 mmol/L — ABNORMAL LOW (ref 22–32)
Calcium: 9.2 mg/dL (ref 8.9–10.3)
Chloride: 109 mmol/L (ref 98–111)
Creatinine, Ser: 3.92 mg/dL — ABNORMAL HIGH (ref 0.44–1.00)
GFR, Estimated: 12 mL/min — ABNORMAL LOW (ref >60)
Glucose, Bld: 110 mg/dL — ABNORMAL HIGH (ref 70–99)
Phosphorus: 3.7 mg/dL (ref 2.5–4.6)
Potassium: 4.1 mmol/L (ref 3.5–5.1)
Sodium: 139 mmol/L (ref 135–145)

## 2023-04-18 LAB — PROTEIN / CREATININE RATIO, URINE
Creatinine, Urine: 122 mg/dL
Protein Creatinine Ratio: 0.71 mg/mg{creat} — ABNORMAL HIGH (ref 0.00–0.15)
Total Protein, Urine: 87 mg/dL

## 2023-04-20 LAB — PARATHYROID HORMONE, INTACT (NO CA): PTH: 59 pg/mL (ref 15–65)

## 2023-04-24 DIAGNOSIS — D631 Anemia in chronic kidney disease: Secondary | ICD-10-CM | POA: Diagnosis not present

## 2023-04-24 DIAGNOSIS — N185 Chronic kidney disease, stage 5: Secondary | ICD-10-CM | POA: Diagnosis not present

## 2023-04-24 DIAGNOSIS — N032 Chronic nephritic syndrome with diffuse membranous glomerulonephritis: Secondary | ICD-10-CM | POA: Diagnosis not present

## 2023-04-24 DIAGNOSIS — I129 Hypertensive chronic kidney disease with stage 1 through stage 4 chronic kidney disease, or unspecified chronic kidney disease: Secondary | ICD-10-CM | POA: Diagnosis not present

## 2023-04-29 ENCOUNTER — Ambulatory Visit
Admission: RE | Admit: 2023-04-29 | Discharge: 2023-04-29 | Disposition: A | Discharge status: Home / Self Care | Payer: 59 | Source: Ambulatory Visit | Attending: Internal Medicine | Admitting: Internal Medicine

## 2023-04-29 DIAGNOSIS — Z1231 Encounter for screening mammogram for malignant neoplasm of breast: Secondary | ICD-10-CM | POA: Diagnosis not present

## 2023-06-13 ENCOUNTER — Telehealth: Payer: Self-pay | Admitting: *Deleted

## 2023-06-13 NOTE — Telephone Encounter (Signed)
Pt called the office to schedule her yearly surveillance visit with Dr. Pricilla Holm. Pt was given an appt. For Friday, July  25 th at 3:00 pm, reminded pt to arrive by 2:45 for check in. Pt agreed to date and time.

## 2023-07-01 ENCOUNTER — Other Ambulatory Visit: Payer: Self-pay | Admitting: Podiatry

## 2023-07-24 ENCOUNTER — Other Ambulatory Visit (HOSPITAL_COMMUNITY)
Admission: RE | Admit: 2023-07-24 | Discharge: 2023-07-24 | Disposition: A | Discharge status: Home / Self Care | Source: Ambulatory Visit | Attending: Nephrology | Admitting: Nephrology

## 2023-07-24 DIAGNOSIS — R809 Proteinuria, unspecified: Secondary | ICD-10-CM | POA: Insufficient documentation

## 2023-07-24 DIAGNOSIS — N189 Chronic kidney disease, unspecified: Secondary | ICD-10-CM | POA: Diagnosis not present

## 2023-07-24 DIAGNOSIS — D631 Anemia in chronic kidney disease: Secondary | ICD-10-CM | POA: Insufficient documentation

## 2023-07-24 LAB — PROTEIN / CREATININE RATIO, URINE
Creatinine, Urine: 118 mg/dL
Protein Creatinine Ratio: 0.61 mg/mg{creat} — ABNORMAL HIGH (ref 0.00–0.15)
Total Protein, Urine: 72 mg/dL

## 2023-07-24 LAB — CBC
HCT: 33.1 % — ABNORMAL LOW (ref 36.0–46.0)
Hemoglobin: 10.6 g/dL — ABNORMAL LOW (ref 12.0–15.0)
MCH: 32 pg (ref 26.0–34.0)
MCHC: 32 g/dL (ref 30.0–36.0)
MCV: 100 fL (ref 80.0–100.0)
Platelets: 227 10*3/uL (ref 150–400)
RBC: 3.31 MIL/uL — ABNORMAL LOW (ref 3.87–5.11)
RDW: 13.7 % (ref 11.5–15.5)
WBC: 8.1 10*3/uL (ref 4.0–10.5)
nRBC: 0 % (ref 0.0–0.2)

## 2023-07-24 LAB — IRON AND TIBC
Iron: 53 ug/dL (ref 28–170)
Saturation Ratios: 24 % (ref 10.4–31.8)
TIBC: 222 ug/dL — ABNORMAL LOW (ref 250–450)
UIBC: 169 ug/dL

## 2023-07-24 LAB — RENAL FUNCTION PANEL
Albumin: 3.6 g/dL (ref 3.5–5.0)
Anion gap: 11 (ref 5–15)
BUN: 35 mg/dL — ABNORMAL HIGH (ref 8–23)
CO2: 20 mmol/L — ABNORMAL LOW (ref 22–32)
Calcium: 9 mg/dL (ref 8.9–10.3)
Chloride: 105 mmol/L (ref 98–111)
Creatinine, Ser: 3.85 mg/dL — ABNORMAL HIGH (ref 0.44–1.00)
GFR, Estimated: 12 mL/min — ABNORMAL LOW (ref >60)
Glucose, Bld: 94 mg/dL (ref 70–99)
Phosphorus: 4.1 mg/dL (ref 2.5–4.6)
Potassium: 4.2 mmol/L (ref 3.5–5.1)
Sodium: 136 mmol/L (ref 135–145)

## 2023-07-24 LAB — FERRITIN: Ferritin: 172 ng/mL (ref 11–307)

## 2023-08-01 DIAGNOSIS — D631 Anemia in chronic kidney disease: Secondary | ICD-10-CM | POA: Diagnosis not present

## 2023-08-01 DIAGNOSIS — N032 Chronic nephritic syndrome with diffuse membranous glomerulonephritis: Secondary | ICD-10-CM | POA: Diagnosis not present

## 2023-08-01 DIAGNOSIS — N185 Chronic kidney disease, stage 5: Secondary | ICD-10-CM | POA: Diagnosis not present

## 2023-08-01 DIAGNOSIS — I129 Hypertensive chronic kidney disease with stage 1 through stage 4 chronic kidney disease, or unspecified chronic kidney disease: Secondary | ICD-10-CM | POA: Diagnosis not present

## 2023-09-06 IMAGING — MG MM DIGITAL SCREENING BILAT W/ TOMO AND CAD
8 series · 8 of 24 positions shown · non-contrast
Comparison: Previous exam(s).

CLINICAL DATA: Screening.

EXAM:
DIGITAL SCREENING BILATERAL MAMMOGRAM WITH TOMOSYNTHESIS AND CAD
TECHNIQUE: Bilateral screening digital craniocaudal and mediolateral oblique
mammograms were obtained. Bilateral screening digital breast
tomosynthesis was performed. The images were evaluated with
computer-aided detection.

[L MLO synth-2D]
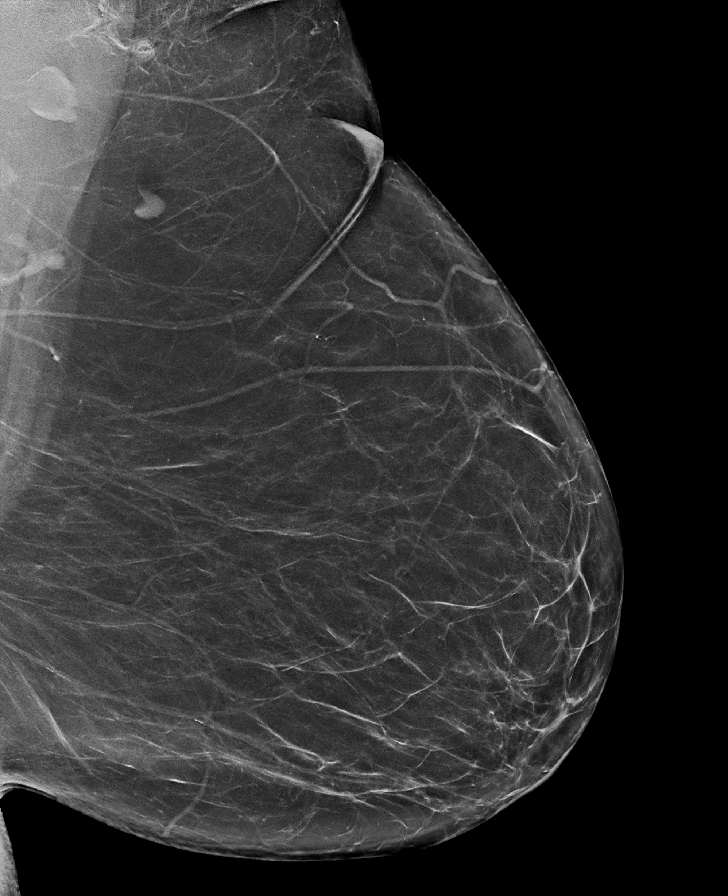

[R MLO synth-2D]
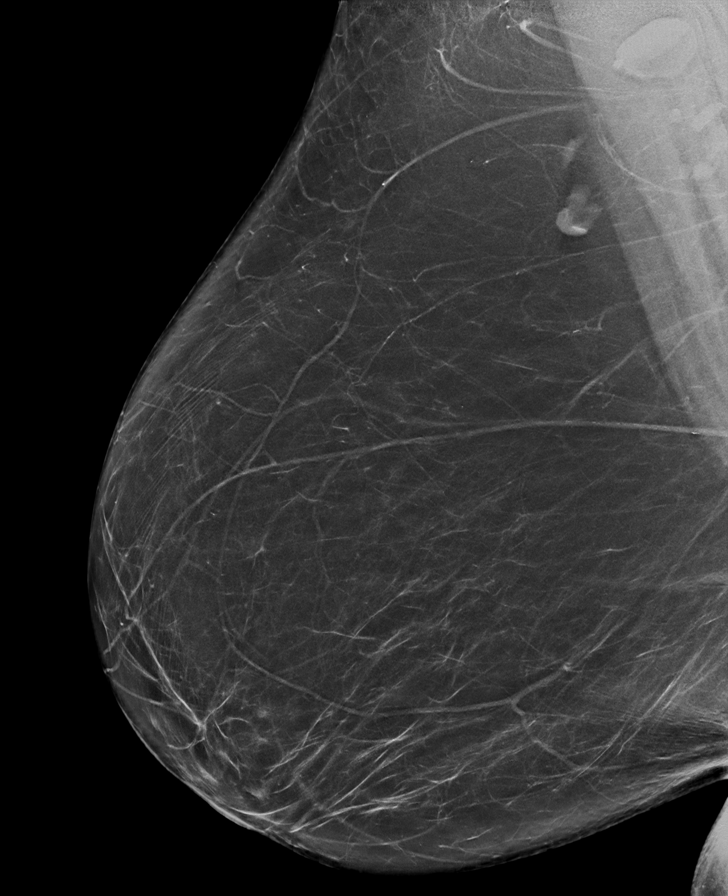

[L CC synth-2D]
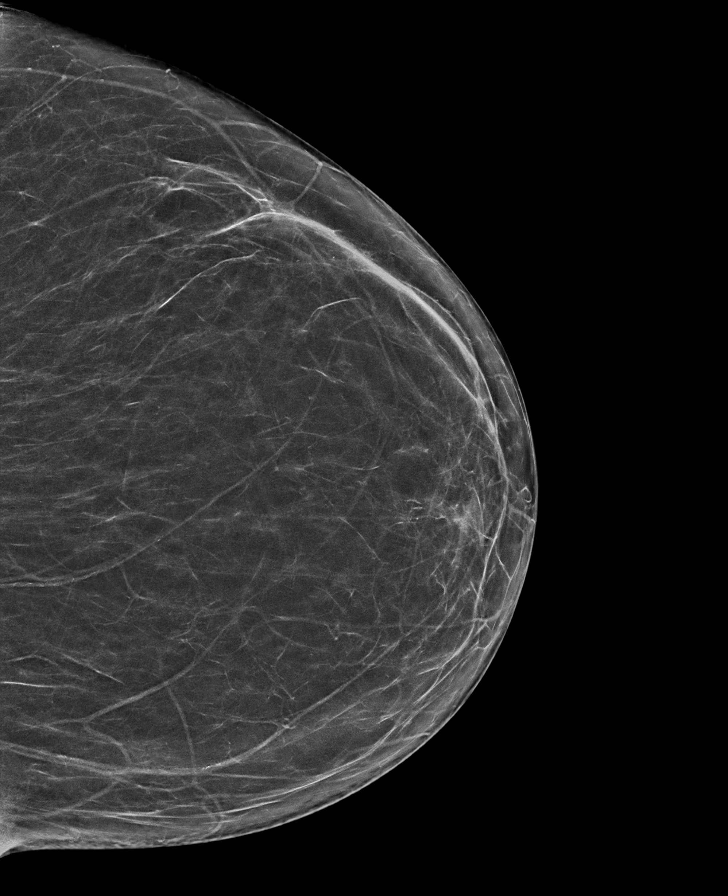

[R CC synth-2D]
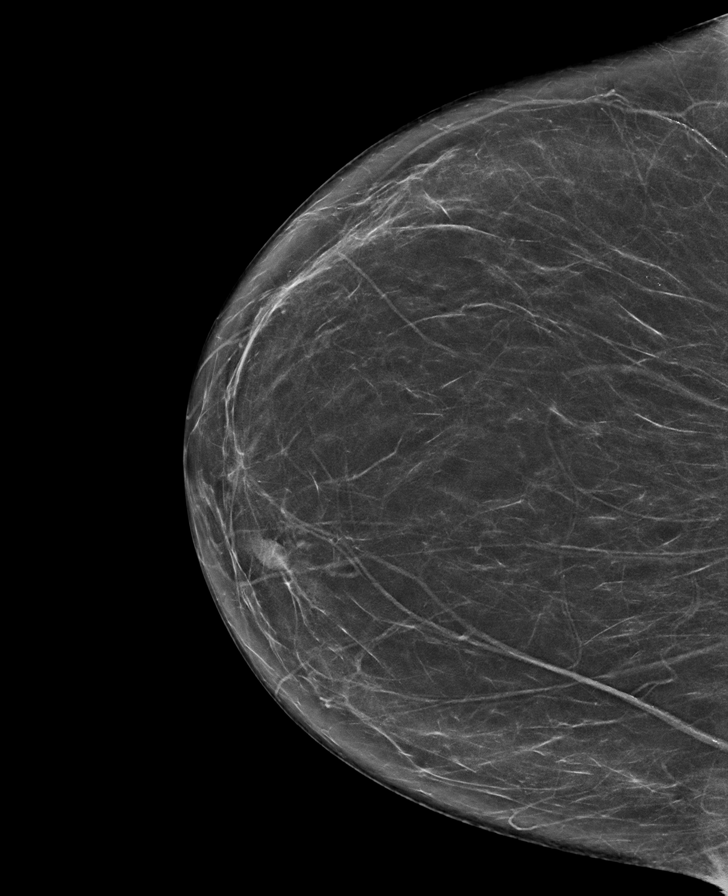

[L CC tomo · tomo slice 35/70.0]
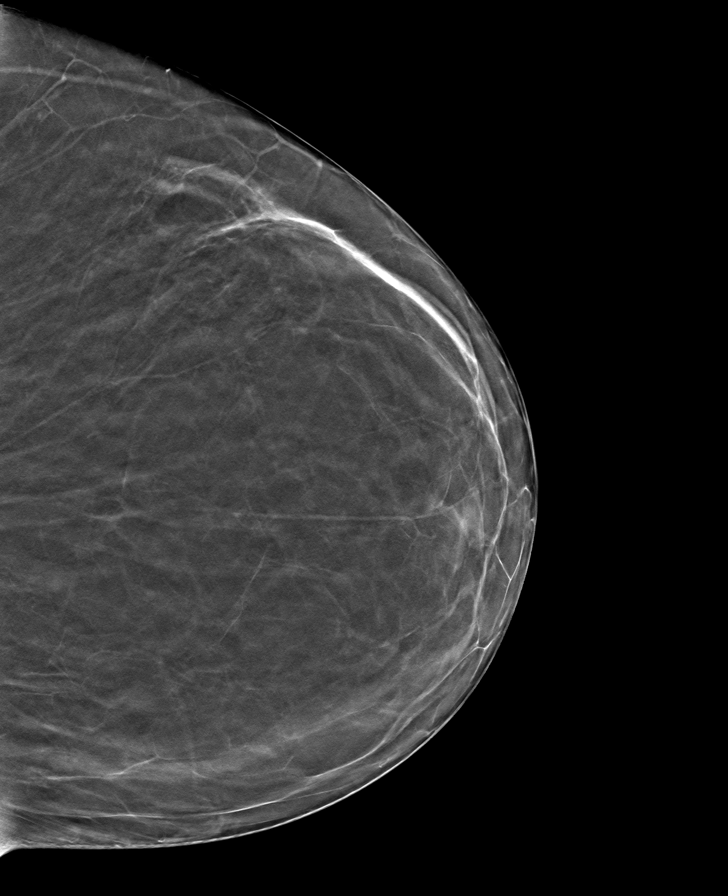

[R CC tomo · tomo slice 37/74.0]
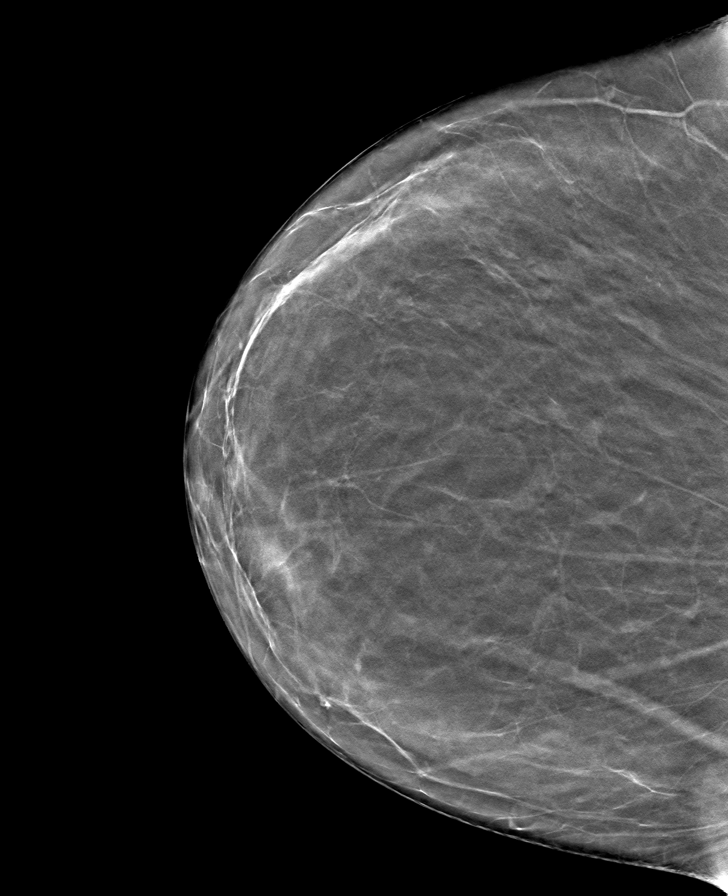

[L MLO tomo · tomo slice 43/84.0]
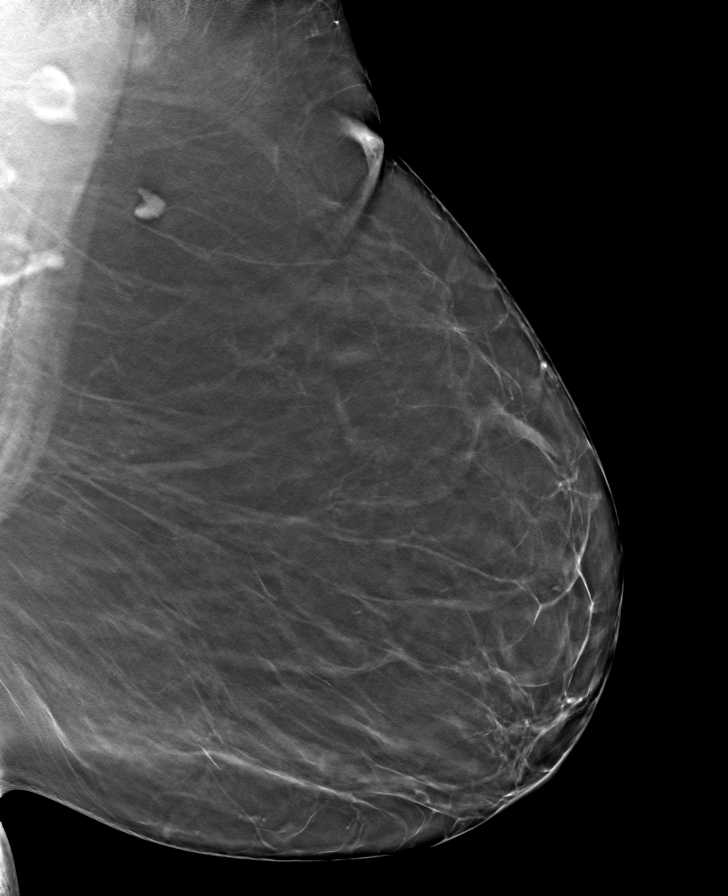

[R MLO tomo · tomo slice 43/86.0]
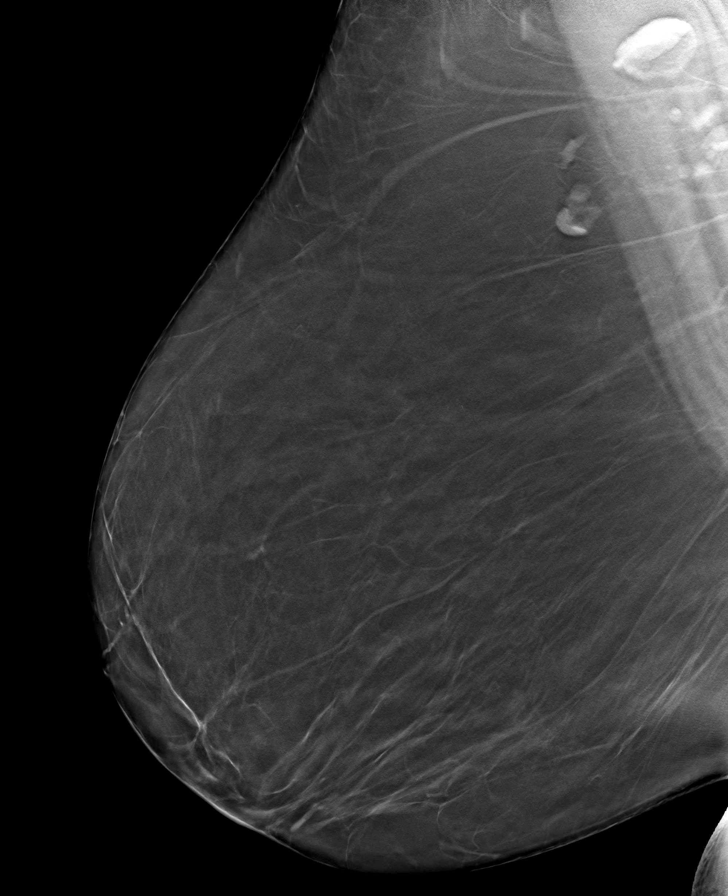

[8 of 24 positions shown; findings below may reference images not displayed]

ACR Breast Density Category b: There are scattered areas of
fibroglandular density.
FINDINGS: There are no findings suspicious for malignancy.
IMPRESSION: No mammographic evidence of malignancy. A result letter of this
screening mammogram will be mailed directly to the patient.

RECOMMENDATION:
Screening mammogram in one year. (Code:51-O-LD2)

BI-RADS CATEGORY  1: Negative.

## 2023-09-24 DIAGNOSIS — L281 Prurigo nodularis: Secondary | ICD-10-CM | POA: Diagnosis not present

## 2023-10-03 DIAGNOSIS — R5383 Other fatigue: Secondary | ICD-10-CM | POA: Diagnosis not present

## 2023-10-03 DIAGNOSIS — Z0000 Encounter for general adult medical examination without abnormal findings: Secondary | ICD-10-CM | POA: Diagnosis not present

## 2023-10-03 DIAGNOSIS — Z713 Dietary counseling and surveillance: Secondary | ICD-10-CM | POA: Diagnosis not present

## 2023-10-03 DIAGNOSIS — I10 Essential (primary) hypertension: Secondary | ICD-10-CM | POA: Diagnosis not present

## 2023-10-03 DIAGNOSIS — F17210 Nicotine dependence, cigarettes, uncomplicated: Secondary | ICD-10-CM | POA: Diagnosis not present

## 2023-10-03 DIAGNOSIS — Z299 Encounter for prophylactic measures, unspecified: Secondary | ICD-10-CM | POA: Diagnosis not present

## 2023-10-03 DIAGNOSIS — Z7189 Other specified counseling: Secondary | ICD-10-CM | POA: Diagnosis not present

## 2023-10-21 DIAGNOSIS — R011 Cardiac murmur, unspecified: Secondary | ICD-10-CM | POA: Diagnosis not present

## 2023-10-30 ENCOUNTER — Other Ambulatory Visit (HOSPITAL_COMMUNITY)
Admission: RE | Admit: 2023-10-30 | Discharge: 2023-10-30 | Disposition: A | Discharge status: Home / Self Care | Source: Ambulatory Visit | Attending: Nephrology | Admitting: Nephrology

## 2023-10-30 DIAGNOSIS — D631 Anemia in chronic kidney disease: Secondary | ICD-10-CM | POA: Insufficient documentation

## 2023-10-30 DIAGNOSIS — N189 Chronic kidney disease, unspecified: Secondary | ICD-10-CM | POA: Insufficient documentation

## 2023-10-30 DIAGNOSIS — E211 Secondary hyperparathyroidism, not elsewhere classified: Secondary | ICD-10-CM | POA: Insufficient documentation

## 2023-10-30 DIAGNOSIS — R809 Proteinuria, unspecified: Secondary | ICD-10-CM | POA: Insufficient documentation

## 2023-10-30 LAB — CBC
HCT: 32.6 % — ABNORMAL LOW (ref 36.0–46.0)
Hemoglobin: 10.5 g/dL — ABNORMAL LOW (ref 12.0–15.0)
MCH: 32 pg (ref 26.0–34.0)
MCHC: 32.2 g/dL (ref 30.0–36.0)
MCV: 99.4 fL (ref 80.0–100.0)
Platelets: 230 10*3/uL (ref 150–400)
RBC: 3.28 MIL/uL — ABNORMAL LOW (ref 3.87–5.11)
RDW: 13.9 % (ref 11.5–15.5)
WBC: 8 10*3/uL (ref 4.0–10.5)
nRBC: 0 % (ref 0.0–0.2)

## 2023-10-30 LAB — RENAL FUNCTION PANEL
Albumin: 3.4 g/dL — ABNORMAL LOW (ref 3.5–5.0)
Anion gap: 10 (ref 5–15)
BUN: 40 mg/dL — ABNORMAL HIGH (ref 8–23)
CO2: 20 mmol/L — ABNORMAL LOW (ref 22–32)
Calcium: 8.9 mg/dL (ref 8.9–10.3)
Chloride: 111 mmol/L (ref 98–111)
Creatinine, Ser: 3.63 mg/dL — ABNORMAL HIGH (ref 0.44–1.00)
GFR, Estimated: 13 mL/min — ABNORMAL LOW (ref >60)
Glucose, Bld: 101 mg/dL — ABNORMAL HIGH (ref 70–99)
Phosphorus: 4.2 mg/dL (ref 2.5–4.6)
Potassium: 4 mmol/L (ref 3.5–5.1)
Sodium: 141 mmol/L (ref 135–145)

## 2023-10-30 LAB — PROTEIN / CREATININE RATIO, URINE
Creatinine, Urine: 89 mg/dL
Protein Creatinine Ratio: 0.65 mg/mg{creat} — ABNORMAL HIGH (ref 0.00–0.15)
Total Protein, Urine: 58 mg/dL

## 2023-11-06 DIAGNOSIS — N032 Chronic nephritic syndrome with diffuse membranous glomerulonephritis: Secondary | ICD-10-CM | POA: Diagnosis not present

## 2023-11-06 DIAGNOSIS — D631 Anemia in chronic kidney disease: Secondary | ICD-10-CM | POA: Diagnosis not present

## 2023-11-06 DIAGNOSIS — N185 Chronic kidney disease, stage 5: Secondary | ICD-10-CM | POA: Diagnosis not present

## 2023-11-06 DIAGNOSIS — I129 Hypertensive chronic kidney disease with stage 1 through stage 4 chronic kidney disease, or unspecified chronic kidney disease: Secondary | ICD-10-CM | POA: Diagnosis not present

## 2023-12-06 ENCOUNTER — Encounter: Payer: Self-pay | Admitting: Gynecologic Oncology

## 2023-12-06 ENCOUNTER — Inpatient Hospital Stay: Payer: 59 | Attending: Gynecologic Oncology | Admitting: Gynecologic Oncology

## 2023-12-06 VITALS — BP 120/62 | HR 79 | Temp 98.3°F | Resp 19 | Wt 196.4 lb

## 2023-12-06 DIAGNOSIS — Z8542 Personal history of malignant neoplasm of other parts of uterus: Secondary | ICD-10-CM | POA: Diagnosis not present

## 2023-12-06 DIAGNOSIS — Z90710 Acquired absence of both cervix and uterus: Secondary | ICD-10-CM | POA: Diagnosis not present

## 2023-12-06 DIAGNOSIS — Z9079 Acquired absence of other genital organ(s): Secondary | ICD-10-CM | POA: Diagnosis not present

## 2023-12-06 DIAGNOSIS — Z90722 Acquired absence of ovaries, bilateral: Secondary | ICD-10-CM | POA: Diagnosis not present

## 2023-12-06 DIAGNOSIS — C541 Malignant neoplasm of endometrium: Secondary | ICD-10-CM

## 2023-12-06 NOTE — Patient Instructions (Signed)
 It was good to see you today.  I do not see or feel any evidence of cancer recurrence on your exam.  Please schedule follow-up in 6 months with your OB/GYN.  We will see you for follow-up in 6 months.  As always, if you develop any new and concerning symptoms before your next visit, please call to see me sooner.

## 2023-12-06 NOTE — Progress Notes (Signed)
 Gynecologic Oncology Return Clinic Visit  12/06/23  Reason for Visit: surveillance   Treatment History: Oncology History Overview Note  MSI-stable   Endometrial/uterine adenocarcinoma (HCC)  08/13/2019 Initial Biopsy   CKC and D&C - CKC negativ, EMB showed FIGO gr1 EMCA   08/13/2019 Initial Diagnosis   Endometrial/uterine adenocarcinoma (HCC)   09/24/2019 Surgery   TRH/BSO, SLN   09/24/2019 Pathology Results   IA, grade 1, endometrioid adenoca; DOI 1/84mm, no LVSI, SLNs negative   09/24/2019 Cancer Staging   Staging form: Corpus Uteri - Carcinoma and Carcinosarcoma, AJCC 8th Edition - Clinical stage from 09/24/2019: FIGO Stage IA (cT1a, cN0(sn), cM0) - Signed by Viktoria Comer SAUNDERS, MD on 09/29/2019     Interval History: Doing well.  Denies any vaginal bleeding.  Denies abdominal or pelvic pain.  Reports baseline bowel and bladder function.  Past Medical/Surgical History: Past Medical History:  Diagnosis Date   Absolute anemia 11/10/2015   Anemia    Anemia of renal disease 11/10/2015   Asthma    has Albuterol  inhaler prn   CHF (congestive heart failure) (HCC)    acute systolic CHF 12/2011 [facility])   Chronic kidney disease    Membranous nephropathy ( Seen at [facility])   Endometrial adenocarcinoma (HCC)    Gastric ulcer    History of blood transfusion    no abnormal reaction noted   History of bronchitis    last time 3-59yrs ago   Hyperlipidemia    takes Pravastatin  daily   Hypertension    takes Imdur  and Hydralazine  daily as well as Coreg    Hypothyroidism    takes Synthroid daily   Itching    on legs-has a Kenalog cream   Metabolic bone disease    Nausea    takes Zofran  prn   Nonischemic cardiomyopathy (HCC)    12/2011 normal coronaries, EF 22% (EF normal 04/2013)   Obesity    Peripheral edema    takes Torsemide daily   Thyroid  disease    hypothyroidism    Past Surgical History:  Procedure Laterality Date   AV FISTULA PLACEMENT Left 07/22/2012   Procedure:  INSERTION OF ARTERIOVENOUS GORE-TEX GRAFT ARM;  Surgeon: Lonni GORMAN Blade, MD;  Location: [facility] OR;  Service: Vascular;  Laterality: Left;   AV FISTULA PLACEMENT Right 05/24/2017   Procedure: INSERTION OF ARTERIOVENOUS (AV) GORE-TEX GRAFT RIGHT ARM;  Surgeon: Blade Lonni GORMAN, MD;  Location: [facility] OR;  Service: Vascular;  Laterality: Right;   CESAREAN SECTION     30+yrs ago   COLONOSCOPY     DILATION AND CURETTAGE, DIAGNOSTIC / THERAPEUTIC     left arm surgery with pin     15+yrs ago   ROBOTIC ASSISTED TOTAL HYSTERECTOMY WITH BILATERAL SALPINGO OOPHERECTOMY Bilateral 09/24/2019   Procedure: XI ROBOTIC ASSISTED TOTAL HYSTERECTOMY WITH BILATERAL SALPINGO OOPHORECTOMY;  Surgeon: Viktoria Comer SAUNDERS, MD;  Location: [facility] ORS;  Service: Gynecology;  Laterality: Bilateral;   SENTINEL NODE BIOPSY N/A 09/24/2019   Procedure: SENTINEL NODE BIOPSY;  Surgeon: Viktoria Comer SAUNDERS, MD;  Location: [facility] ORS;  Service: Gynecology;  Laterality: N/A;   TUBAL LIGATION      Family History  Problem Relation Age of Onset   Kidney disease Other    Diabetes Mother    Breast cancer Neg Hx    Colon cancer Neg Hx    Uterine cancer Neg Hx    Ovarian cancer Neg Hx     Social History   Socioeconomic History   Marital status: Single    Spouse  name: Not on file   Number of children: Not on file   Years of education: Not on file   Highest education level: Not on file  Occupational History   Not on file  Tobacco Use   Smoking status: Every Day    Current packs/day: 1.00    Average packs/day: 1 pack/day for 55.6 years (55.6 ttl pk-yrs)    Types: Cigarettes    Start date: 05/14/1968   Smokeless tobacco: Never  Vaping Use   Vaping status: Never Used  Substance and Sexual Activity   Alcohol use: No   Drug use: No   Sexual activity: Not Currently  Other Topics Concern   Not on file  Social History Narrative   Not on file   Social Drivers of Health   Financial Resource Strain: Not on file  Food  Insecurity: No Food Insecurity (02/06/2022)   [facility]   Hunger Vital Sign    Within the past 12 months, you worried that your food would run out before you got the money to buy more.: Never true    Within the past 12 months, the food you bought just didn't last and you didn't have money to get more.: Never true  Transportation Needs: No Transportation Needs (08/04/2019)   Received [facility]   PRAPARE - Transportation    Lack of Transportation (Medical): No    Lack of Transportation (Non-Medical): No  Physical Activity: Inactive (05/02/2023)   Received [facility]   Exercise Vital Sign    On average, how many days per week do you engage in moderate to strenuous exercise (like a brisk walk)?: 0 days    On average, how many minutes do you engage in exercise at this level?: 0 min  Stress: No Stress Concern Present (08/04/2019)   Received from [facility] of Occupational Health - Occupational Stress Questionnaire    Feeling of Stress : Not at all  Social Connections: Unknown (01/26/2022)   Received from [facility]   Social Network    Social Network: Not on file    Current Medications:  Current Outpatient Medications:    acetaminophen  (TYLENOL ) 500 MG tablet, Take 1,000 mg by mouth daily as needed for moderate pain or headache., Disp: , Rfl:    albuterol  (PROVENTIL  HFA;VENTOLIN  HFA) 108 (90 BASE) MCG/ACT inhaler, Inhale 1-2 puffs into the lungs every 6 (six) hours as needed for wheezing. , Disp: , Rfl:    carvedilol  (COREG ) 12.5 MG tablet, Take 12.5 mg by mouth 2 (two) times daily., Disp: , Rfl:    clotrimazole -betamethasone  (LOTRISONE ) cream, APPLY  CREAM TOPICALLY TWICE DAILY, Disp: 45 g, Rfl: 0   conjugated estrogens  (PREMARIN ) vaginal cream, Place 1 Applicatorful vaginally 3 (three) times a week., Disp: 90 g, Rfl: 1   DUPIXENT 300 MG/2ML SOPN, Inject into the skin., Disp: , Rfl:    hydrALAZINE  (APRESOLINE ) 50 MG tablet,  Take 50 mg by mouth 3 (three) times daily. , Disp: , Rfl:    isosorbide  mononitrate (IMDUR ) 30 MG 24 hr tablet, Take 30 mg by mouth daily., Disp: , Rfl:    metolazone (ZAROXOLYN) 5 MG tablet, Take 5 mg by mouth 2 (two) times daily as needed (swelling). , Disp: , Rfl:    pravastatin  (PRAVACHOL ) 80 MG tablet, Take 80 mg by mouth daily., Disp: , Rfl:    raloxifene (EVISTA) 60 MG tablet, Take 60 mg by mouth daily., Disp: , Rfl:  RAYALDEE  30 MCG CPCR, Take 30 mcg by mouth daily. , Disp: , Rfl:    sodium bicarbonate 650 MG tablet, Take 1,300 mg by mouth 3 (three) times daily. , Disp: , Rfl:    torsemide (DEMADEX) 20 MG tablet, Take 20 mg by mouth daily as needed (swelling)., Disp: , Rfl:    triamcinolone  ointment (KENALOG) 0.1 %, Apply 1 Application topically., Disp: , Rfl:   Review of Systems: + back pain Denies appetite changes, fevers, chills, fatigue, unexplained weight changes. Denies hearing loss, neck lumps or masses, mouth sores, ringing in ears or voice changes. Denies cough or wheezing.  Denies shortness of breath. Denies chest pain or palpitations. Denies leg swelling. Denies abdominal distention, pain, blood in stools, constipation, diarrhea, nausea, vomiting, or early satiety. Denies pain with intercourse, dysuria, frequency, hematuria or incontinence. Denies hot flashes, pelvic pain, vaginal bleeding or vaginal discharge.   Denies joint pain or muscle pain/cramps. Denies itching, rash, or wounds. Denies dizziness, headaches, numbness or seizures. Denies swollen lymph nodes or glands, denies easy bruising or bleeding. Denies anxiety, depression, confusion, or decreased concentration.  Physical Exam: BP 120/62 (BP Location: Left Arm, Patient Position: Sitting)   Pulse 79   Temp 98.3 F (36.8 C) (Oral)   Resp 19   Wt 196 lb 6.4 oz (89.1 kg)   SpO2 94%   BMI 34.79 kg/m  General: Alert, oriented, no acute distress. HEENT: Normocephalic, atraumatic, sclera anicteric. Chest:  Unlabored breathing on room air. Abdomen: Obese, soft, nontender.  Normoactive bowel sounds.  No masses or hepatosplenomegaly appreciated.  Well-healed laparoscopic incisions. Extremities: Grossly normal range of motion.  Warm, well perfused.  Trace edema bilaterally. Lymphatics: No cervical, supraclavicular, or inguinal adenopathy. GU: Normal appearing external genitalia without erythema, excoriation, or lesions.  Speculum exam reveals mildly atrophic vaginal mucosa, no lesions or masses, no bleeding or discharge.  Bimanual exam reveals cuff intact, no masses or nodularity.  Rectovaginal exam confirms these findings.  Laboratory & Radiologic Studies: None new  Assessment & Plan: Shannon [name] is a 68 y.o. woman with Stage 1A2, grade 1 low risk, early stage endometrioid adenocarcinoma who presents for surveillance. Definitive surgery 09/2019. MMR intact, MS stable.   The patient is doing well. NED on exam today.    Per NCCN surveillance recommendations, we will alternative visits with her OBGYN every 6 months for an additional year. At that time, she will be 5 years from treatment and will be discharged to further follow-up with her OBGYN if she remains NED.    We will see her back in 1 year.  We discussed the signs and symptoms that would be concerning for disease recurrence that should prompt a phone call prior to her next visit.   20 minutes of total time was spent for this patient encounter, including preparation, face-to-face counseling with the patient and coordination of care, and documentation of the encounter.  Comer Dollar, MD  Division of Gynecologic Oncology  Department of Obstetrics and Gynecology  [facility] [place]  [facility]

## 2023-12-17 DIAGNOSIS — N185 Chronic kidney disease, stage 5: Secondary | ICD-10-CM | POA: Diagnosis not present

## 2023-12-17 DIAGNOSIS — J449 Chronic obstructive pulmonary disease, unspecified: Secondary | ICD-10-CM | POA: Diagnosis not present

## 2023-12-17 DIAGNOSIS — Z299 Encounter for prophylactic measures, unspecified: Secondary | ICD-10-CM | POA: Diagnosis not present

## 2023-12-17 DIAGNOSIS — M545 Low back pain, unspecified: Secondary | ICD-10-CM | POA: Diagnosis not present

## 2023-12-17 DIAGNOSIS — R0789 Other chest pain: Secondary | ICD-10-CM | POA: Diagnosis not present

## 2023-12-17 DIAGNOSIS — I10 Essential (primary) hypertension: Secondary | ICD-10-CM | POA: Diagnosis not present

## 2024-01-01 DIAGNOSIS — F17210 Nicotine dependence, cigarettes, uncomplicated: Secondary | ICD-10-CM | POA: Diagnosis not present

## 2024-01-01 DIAGNOSIS — Z0000 Encounter for general adult medical examination without abnormal findings: Secondary | ICD-10-CM | POA: Diagnosis not present

## 2024-01-01 DIAGNOSIS — I10 Essential (primary) hypertension: Secondary | ICD-10-CM | POA: Diagnosis not present

## 2024-01-01 DIAGNOSIS — Z299 Encounter for prophylactic measures, unspecified: Secondary | ICD-10-CM | POA: Diagnosis not present

## 2024-01-01 DIAGNOSIS — R5383 Other fatigue: Secondary | ICD-10-CM | POA: Diagnosis not present

## 2024-01-01 DIAGNOSIS — E7800 Pure hypercholesterolemia, unspecified: Secondary | ICD-10-CM | POA: Diagnosis not present

## 2024-01-01 DIAGNOSIS — Z79899 Other long term (current) drug therapy: Secondary | ICD-10-CM | POA: Diagnosis not present

## 2024-01-01 DIAGNOSIS — I509 Heart failure, unspecified: Secondary | ICD-10-CM | POA: Diagnosis not present

## 2024-02-05 ENCOUNTER — Other Ambulatory Visit (HOSPITAL_COMMUNITY)
Admission: RE | Admit: 2024-02-05 | Discharge: 2024-02-05 | Disposition: A | Discharge status: Home / Self Care | Source: Ambulatory Visit | Attending: Nephrology | Admitting: Nephrology

## 2024-02-05 DIAGNOSIS — N189 Chronic kidney disease, unspecified: Secondary | ICD-10-CM | POA: Diagnosis not present

## 2024-02-05 LAB — CBC
HCT: 33.2 % — ABNORMAL LOW (ref 36.0–46.0)
Hemoglobin: 10.5 g/dL — ABNORMAL LOW (ref 12.0–15.0)
MCH: 31.7 pg (ref 26.0–34.0)
MCHC: 31.6 g/dL (ref 30.0–36.0)
MCV: 100.3 fL — ABNORMAL HIGH (ref 80.0–100.0)
Platelets: 205 K/uL (ref 150–400)
RBC: 3.31 MIL/uL — ABNORMAL LOW (ref 3.87–5.11)
RDW: 13.6 % (ref 11.5–15.5)
WBC: 6.5 K/uL (ref 4.0–10.5)
nRBC: 0 % (ref 0.0–0.2)

## 2024-02-05 LAB — RENAL FUNCTION PANEL
Albumin: 3.4 g/dL — ABNORMAL LOW (ref 3.5–5.0)
Anion gap: 9 (ref 5–15)
BUN: 44 mg/dL — ABNORMAL HIGH (ref 8–23)
CO2: 22 mmol/L (ref 22–32)
Calcium: 8.8 mg/dL — ABNORMAL LOW (ref 8.9–10.3)
Chloride: 110 mmol/L (ref 98–111)
Creatinine, Ser: 3.92 mg/dL — ABNORMAL HIGH (ref 0.44–1.00)
GFR, Estimated: 12 mL/min — ABNORMAL LOW (ref >60)
Glucose, Bld: 134 mg/dL — ABNORMAL HIGH (ref 70–99)
Phosphorus: 4.7 mg/dL — ABNORMAL HIGH (ref 2.5–4.6)
Potassium: 4 mmol/L (ref 3.5–5.1)
Sodium: 141 mmol/L (ref 135–145)

## 2024-02-05 LAB — IRON AND TIBC
Iron: 48 ug/dL (ref 28–170)
Saturation Ratios: 23 % (ref 10.4–31.8)
TIBC: 210 ug/dL — ABNORMAL LOW (ref 250–450)
UIBC: 162 ug/dL

## 2024-02-05 LAB — FERRITIN: Ferritin: 151 ng/mL (ref 11–307)

## 2024-02-05 LAB — PROTEIN / CREATININE RATIO, URINE
Creatinine, Urine: 80 mg/dL
Protein Creatinine Ratio: 0.79 mg/mg{creat} — ABNORMAL HIGH (ref 0.00–0.15)
Total Protein, Urine: 63 mg/dL

## 2024-02-06 LAB — PARATHYROID HORMONE, INTACT (NO CA): PTH: 69 pg/mL — ABNORMAL HIGH (ref 15–65)

## 2024-02-11 DIAGNOSIS — N032 Chronic nephritic syndrome with diffuse membranous glomerulonephritis: Secondary | ICD-10-CM | POA: Diagnosis not present

## 2024-02-11 DIAGNOSIS — D631 Anemia in chronic kidney disease: Secondary | ICD-10-CM | POA: Diagnosis not present

## 2024-02-11 DIAGNOSIS — I129 Hypertensive chronic kidney disease with stage 1 through stage 4 chronic kidney disease, or unspecified chronic kidney disease: Secondary | ICD-10-CM | POA: Diagnosis not present

## 2024-02-11 DIAGNOSIS — N185 Chronic kidney disease, stage 5: Secondary | ICD-10-CM | POA: Diagnosis not present

## 2024-03-09 DIAGNOSIS — I509 Heart failure, unspecified: Secondary | ICD-10-CM | POA: Diagnosis not present

## 2024-03-09 DIAGNOSIS — Z299 Encounter for prophylactic measures, unspecified: Secondary | ICD-10-CM | POA: Diagnosis not present

## 2024-03-09 DIAGNOSIS — J441 Chronic obstructive pulmonary disease with (acute) exacerbation: Secondary | ICD-10-CM | POA: Diagnosis not present

## 2024-03-09 DIAGNOSIS — N185 Chronic kidney disease, stage 5: Secondary | ICD-10-CM | POA: Diagnosis not present

## 2024-03-09 DIAGNOSIS — I739 Peripheral vascular disease, unspecified: Secondary | ICD-10-CM | POA: Diagnosis not present

## 2024-03-09 DIAGNOSIS — I25119 Atherosclerotic heart disease of native coronary artery with unspecified angina pectoris: Secondary | ICD-10-CM | POA: Diagnosis not present

## 2024-03-09 DIAGNOSIS — J069 Acute upper respiratory infection, unspecified: Secondary | ICD-10-CM | POA: Diagnosis not present

## 2024-04-13 ENCOUNTER — Other Ambulatory Visit (HOSPITAL_COMMUNITY)
Admission: RE | Admit: 2024-04-13 | Discharge: 2024-04-13 | Disposition: A | Source: Ambulatory Visit | Attending: Nephrology | Admitting: Nephrology

## 2024-04-13 LAB — RENAL FUNCTION PANEL
Albumin: 3.9 g/dL (ref 3.5–5.0)
Anion gap: 12 (ref 5–15)
BUN: 37 mg/dL — ABNORMAL HIGH (ref 8–23)
CO2: 19 mmol/L — ABNORMAL LOW (ref 22–32)
Calcium: 9.1 mg/dL (ref 8.9–10.3)
Chloride: 109 mmol/L (ref 98–111)
Creatinine, Ser: 3.6 mg/dL — ABNORMAL HIGH (ref 0.44–1.00)
GFR, Estimated: 13 mL/min — ABNORMAL LOW (ref >60)
Glucose, Bld: 91 mg/dL (ref 70–99)
Phosphorus: 3.1 mg/dL (ref 2.5–4.6)
Potassium: 4.2 mmol/L (ref 3.5–5.1)
Sodium: 140 mmol/L (ref 135–145)

## 2024-04-13 LAB — CBC
HCT: 34.4 % — ABNORMAL LOW (ref 36.0–46.0)
Hemoglobin: 10.9 g/dL — ABNORMAL LOW (ref 12.0–15.0)
MCH: 32.1 pg (ref 26.0–34.0)
MCHC: 31.7 g/dL (ref 30.0–36.0)
MCV: 101.2 fL — ABNORMAL HIGH (ref 80.0–100.0)
Platelets: 256 K/uL (ref 150–400)
RBC: 3.4 MIL/uL — ABNORMAL LOW (ref 3.87–5.11)
RDW: 14.1 % (ref 11.5–15.5)
WBC: 7.1 K/uL (ref 4.0–10.5)
nRBC: 0 % (ref 0.0–0.2)

## 2024-04-14 ENCOUNTER — Inpatient Hospital Stay: Admission: RE | Admit: 2024-04-14 | Source: Ambulatory Visit

## 2024-04-14 LAB — MICROALBUMIN / CREATININE URINE RATIO
Creatinine, Urine: 72.3 mg/dL
Microalb Creat Ratio: 161 mg/g{creat} — ABNORMAL HIGH (ref 0–29)
Microalb, Ur: 116.5 ug/mL — ABNORMAL HIGH

## 2024-04-29 ENCOUNTER — Encounter

## 2024-05-08 ENCOUNTER — Other Ambulatory Visit: Payer: Self-pay | Admitting: Internal Medicine

## 2024-05-08 DIAGNOSIS — Z1231 Encounter for screening mammogram for malignant neoplasm of breast: Secondary | ICD-10-CM

## 2024-05-12 ENCOUNTER — Ambulatory Visit
Admission: RE | Admit: 2024-05-12 | Discharge: 2024-05-12 | Disposition: A | Discharge status: Home / Self Care | Source: Ambulatory Visit | Attending: Internal Medicine | Admitting: Internal Medicine

## 2024-05-12 DIAGNOSIS — Z1231 Encounter for screening mammogram for malignant neoplasm of breast: Secondary | ICD-10-CM

## 2024-12-08 ENCOUNTER — Ambulatory Visit: Admitting: Gynecologic Oncology
